# Patient Record
Sex: Male | Born: 1950 | Race: White | Hispanic: No | Marital: Married | Smoking: Current some day smoker
Health system: Southern US, Community
[De-identification: ages and names within clinical notes are randomized; demographics above are authoritative.]

## PROBLEM LIST (undated history)

## (undated) DIAGNOSIS — N200 Calculus of kidney: Secondary | ICD-10-CM

## (undated) DIAGNOSIS — I714 Abdominal aortic aneurysm, without rupture, unspecified: Secondary | ICD-10-CM

## (undated) DIAGNOSIS — N289 Disorder of kidney and ureter, unspecified: Secondary | ICD-10-CM

## (undated) DIAGNOSIS — E785 Hyperlipidemia, unspecified: Secondary | ICD-10-CM

## (undated) HISTORY — PX: HERNIA REPAIR: SHX51

## (undated) HISTORY — PX: OTHER SURGICAL HISTORY: SHX169

## (undated) HISTORY — PX: SHOULDER SURGERY: SHX246

## (undated) HISTORY — DX: Abdominal aortic aneurysm, without rupture, unspecified: I71.40

## (undated) HISTORY — DX: Hyperlipidemia, unspecified: E78.5

## (undated) HISTORY — PX: SPINE SURGERY: SHX786

## (undated) HISTORY — DX: Abdominal aortic aneurysm, without rupture: I71.4

---

## 1998-05-04 ENCOUNTER — Other Ambulatory Visit: Admission: RE | Admit: 1998-05-04 | Discharge: 1998-05-04 | Payer: Self-pay | Admitting: Family Medicine

## 2001-11-18 ENCOUNTER — Encounter: Payer: Self-pay | Admitting: Urology

## 2001-11-18 ENCOUNTER — Encounter: Admission: RE | Admit: 2001-11-18 | Discharge: 2001-11-18 | Payer: Self-pay | Admitting: Urology

## 2007-01-15 ENCOUNTER — Encounter (INDEPENDENT_AMBULATORY_CARE_PROVIDER_SITE_OTHER): Payer: Self-pay | Admitting: Specialist

## 2007-01-15 ENCOUNTER — Ambulatory Visit (HOSPITAL_BASED_OUTPATIENT_CLINIC_OR_DEPARTMENT_OTHER): Admission: RE | Admit: 2007-01-15 | Discharge: 2007-01-15 | Payer: Self-pay | Admitting: Surgery

## 2007-12-05 ENCOUNTER — Ambulatory Visit (HOSPITAL_BASED_OUTPATIENT_CLINIC_OR_DEPARTMENT_OTHER): Admission: RE | Admit: 2007-12-05 | Discharge: 2007-12-05 | Payer: Self-pay | Admitting: Orthopedic Surgery

## 2011-04-13 ENCOUNTER — Encounter: Payer: Self-pay | Admitting: Vascular Surgery

## 2011-04-24 ENCOUNTER — Encounter (INDEPENDENT_AMBULATORY_CARE_PROVIDER_SITE_OTHER): Payer: BC Managed Care – PPO | Admitting: Surgery

## 2011-04-24 ENCOUNTER — Other Ambulatory Visit: Payer: Self-pay | Admitting: Surgery

## 2011-04-24 ENCOUNTER — Encounter (INDEPENDENT_AMBULATORY_CARE_PROVIDER_SITE_OTHER): Payer: BC Managed Care – PPO

## 2011-04-24 DIAGNOSIS — I714 Abdominal aortic aneurysm, without rupture: Secondary | ICD-10-CM

## 2011-04-25 NOTE — Assessment & Plan Note (Signed)
OFFICE VISIT  Ethan [name], Ethan [name] DOB:  May 18, 1951                                       04/24/2011 ZOXWR#:[id number]  REASON FOR VISIT:  Abdominal aortic aneurysm.  HISTORY:  This is a very pleasant 60 year old gentleman I am seeing at the request of Dr. Clarene Duke for evaluation of abdominal aortic aneurysm. The patient has a significant family history including his father and 4- 6 uncles who suffered from abdominal aortic aneurysms.  The patient's aneurysm was detected at a church screening, and he was sent for followup.  He denies having any symptoms of abdominal pain or back pain. He does not take any medications and is healthy.  His only risk factors are his family history and his tobacco abuse.  REVIEW OF SYSTEMS:  All negative as documented in the encounter form.  PAST MEDICAL HISTORY:  Abdominal aortic aneurysm.  PAST SURGICAL HISTORY: 1. Lower back fusion L2-3 and L3-4. 2. Right lower tibia fibula fracture. 3. Left and right hernia repair.  SOCIAL HISTORY:  He is married with 3 children.  Smokes half pack to pack a day.  Does not drink.  FAMILY HISTORY:  Positive for aneurysm in his father and uncle.  MEDICATIONS:  None.  ALLERGIES:  None.  PHYSICAL EXAM:  Vital signs:  Heart rate 76, blood pressure 132/87, O2 sat 99%.  General:  He is well-appearing, in no distress.  HEENT: Within normal limits.  Conjunctivae normal.  Lungs:  Clear bilaterally. No wheezes or rhonchi.  Cardiovascular:  Regular rate and rhythm.  No carotid bruits.  Palpable femoral pulses.  Palpable pedal pulses. Abdomen:  Soft, nontender.  Pulsatile mass is present.  No hepatosplenomegaly.  Musculoskeletal:  Without major deformities. Neurological:  He is intact.  Skin:  Without ulcer.  DIAGNOSTIC STUDIES:  Ultrasound was repeated in our office today that shows a 4.8 x 4.7 abdominal aortic aneurysm.  Reviewing his records from his life screening he has minimal carotid  stenosis bilaterally.  His ankle brachial indices are now 1.0 bilaterally.  He had some irregularities within his EKG that were nonspecific.  ASSESSMENT:  Asymptomatic abdominal aortic aneurysm.  PLAN:  The patient will be scheduled for a CT angiogram of his chest, abdomen and pelvis to fully evaluate the size and location of his aneurysm.  He will have this done within the next 2 weeks and return for further discussions regarding the timing and type of repair that might be required.  He will need to have cardiac clearance.  He did have an EKG abnormality at his screening that will need to be addressed.  His wife sees Dr. Viann Fish and therefore I am going to refer him to Dr. Donnie Aho.  I will plan on seeing him back in 2 weeks.    Jorge Ny, MD Electronically Signed  VWB/MEDQ  D:  04/24/2011  T:  04/25/2011  Job:  [id number]  cc:   [name], M.D. [name], MD

## 2011-05-04 NOTE — Procedures (Unsigned)
DUPLEX ULTRASOUND OF ABDOMINAL AORTA  INDICATION:  Abdominal aortic aneurysm.  HISTORY: Diabetes:  No. Cardiac:  No. Hypertension:  No. Smoking:  Yes. Connective Tissue Disorder: Family History:  Yes. Previous Surgery:  No.  DUPLEX EXAM:         AP (cm)                   TRANSVERSE (cm) Proximal             2.8 cm                    2.7 cm Mid                  4.7 cm                    4.8 cm Distal               3.7 cm                    3.1 cm Right Iliac          1.4 cm                    1.8 cm Left Iliac           1.2 cm                    1.3 cm  PREVIOUS:  Date:  AP:  4.7  TRANSVERSE:  4.7  IMPRESSION:  Stable appearing abdominal aortic aneurysm within the mid distal aorta measuring 4.8 x 4.7 cm with intraluminal thrombus and atherosclerosis visualized.  The right iliac appears ectatic.  Bilateral iliacs have moderate atherosclerosis.  ___________________________________________ V. Charlena Cross, MD  OD/MEDQ  D:  04/24/2011  T:  04/24/2011  Job:  [id number]

## 2011-05-08 ENCOUNTER — Ambulatory Visit (INDEPENDENT_AMBULATORY_CARE_PROVIDER_SITE_OTHER): Payer: BC Managed Care – PPO | Admitting: Surgery

## 2011-05-08 ENCOUNTER — Ambulatory Visit
Admission: RE | Admit: 2011-05-08 | Discharge: 2011-05-08 | Disposition: A | Discharge status: Home / Self Care | Payer: BC Managed Care – PPO | Source: Ambulatory Visit | Attending: Surgery | Admitting: Surgery

## 2011-05-08 DIAGNOSIS — I714 Abdominal aortic aneurysm, without rupture: Secondary | ICD-10-CM

## 2011-05-08 MED ORDER — IOHEXOL 350 MG/ML SOLN
100.0000 mL | Freq: Once | INTRAVENOUS | Status: AC | PRN
  Administered 2011-05-08: 100 mL via INTRAVENOUS

## 2011-05-09 NOTE — Op Note (Signed)
NAME:  Ethan, [name] NO.:  [id number]   MEDICAL RECORD NO.:  [id number]          PATIENT TYPE:  AMB   LOCATION:  DSC                          FACILITY:  [facility]   PHYSICIAN:  Loreta Ave, M.D. DATE OF BIRTH:  Sep 30, 1951   DATE OF PROCEDURE:  12/05/2007  DATE OF DISCHARGE:  10/04/2007                               OPERATIVE REPORT   PREOPERATIVE DIAGNOSIS:  Left shoulder impingement.  Marked degenerative  joint disease, osteolysis acromioclavicular joint.   POSTOPERATIVE DIAGNOSIS:  Left shoulder impingement.  Marked  degenerative joint disease, osteolysis acromioclavicular joint, with  some abrasive change on the top of the cuff.  No full-thickness tears.   PROCEDURE:  Left shoulder exam under anesthesia, arthroscopy with  debridement of cuff, bursectomy, acromioplasty, coracoacromial ligament  release and excision of distal clavicle.   SURGEON:  Loreta Ave, M.D.   ASSISTANT:  Zonia Kief, PA   ANESTHESIA:  General.   BLOOD LOSS:  Minimal.   SPECIMENS:  None.   CULTURES:  None.   COMPLICATIONS:  None.   DRESSING:  Soft compressive with sling.   PROCEDURE:  The patient brought to the operating room, placed on  operating table in supine position.  After adequate anesthesia had been  obtained, his shoulders examined.  Full motion and stability.  Placed in  beach-chair position on the shoulder positioner, prepped and draped in  the usual sterile fashion.  Three portals, anterior posterior and  lateral.  Shoulder entered with blunt obturator, distended.  Arthroscope  introduced the shoulder distended inspected.  Articular cartilage,  biceps tendon, biceps anchor, undersurface cuff, capsule ligamentous  structures all looked good.  Labrum intact.  Cannula redirected  subacromially.  Obvious impingement.  Bursa resected.  Abrasive changes  on the top of the cuff anteriorly debrided.  No full-thickness tears.  Type 2 to 3 acromion.  Acromioplasty  to type 1 acromion with shaver high-  speed bur.  CA ligament release with cautery.  Distal clavicle exposed.  Grade 4 changes, cystic changes and spurring.  Periarticular spurs  lateral centimeter of clavicle resected with a shaver high-speed bur.  Adequacy of decompression, clavicle excision, cuff debridement confirmed  viewing from all portals.  Instruments and fluid removed.  Portals shoulder and bursa injected with  Marcaine.  Portals closed with 4-0 nylon.  Sterile compressive dressing  applied.  Sling applied.  Anesthesia reversed.  Brought to recovery  room.  Tolerated surgery well.  No complications.      Loreta Ave, M.D.  Electronically Signed     DFM/MEDQ  D:  12/05/2007  T:  12/05/2007  Job:  [id number]

## 2011-05-09 NOTE — Assessment & Plan Note (Signed)
OFFICE VISIT  Ethan [name], Ethan [name] DOB:  1951/06/02                                       05/08/2011 ZOXWR#:[id number]  Patient comes back in today to discuss CT scan findings of his aneurysm. I spent over an hour reviewing his records and talking with the patient about the findings.  I think he is a good candidate for endovascular repair.  I think we can do this percutaneously.  I discussed the risks and benefits, including the risk of GI complications, lower extremity complications, paralysis, bleeding.  All of his questions were answered.  We have scheduled this for Thursday, June 21.    Jorge Ny, MD Electronically Signed  VWB/MEDQ  D:  05/08/2011  T:  05/09/2011  Job:  [id number]  cc:   [name], MD

## 2011-05-12 NOTE — Op Note (Signed)
NAME:  THADDAEUS, GRANJA             ACCOUNT NO.:  [id number]   MEDICAL RECORD NO.:  [id number]          PATIENT TYPE:  AMB   LOCATION:  DSC                          FACILITY:  [facility]   PHYSICIAN:  Thornton Park. Daphine Deutscher, MD  DATE OF BIRTH:  [birth year]   DATE OF PROCEDURE:  01/15/2007  DATE OF DISCHARGE:                               OPERATIVE REPORT   CCS NUMBER:  [id number]   PREOPERATIVE DIAGNOSIS:  Right inguinal hernia.   POSTOPERATIVE DIAGNOSIS:  Right indirect inguinal hernia.   PROCEDURES:  Repair with by excision of sac and repair of floor with a  piece of Bard mesh.   SURGEON:  Dr. Luretha Murphy   ANESTHESIA:  General.   SPECIMEN:  Hernia sac.   DESCRIPTION OF PROCEDURE:  Keric Zehren is a 60 year old man who was  taken to room one at [facility].  The right inguinal region was  clipped and prepped with Techni-Care and draped sterilely.  An oblique  incision was made and carried down to the tissue to the external oblique  which was incised along its fibers and mobilized the cord structures.  I  separated the ilioinguinal nerve branch and retracted with the medial  flap of fascia.  The cord was then mobilized and I went proximally and  dissected out a sac which I opened with my finger inside.  I peeled away  from the remaining cord structures and did high ligation with a  pursestring suture of 2-0 silk.  Excess sac was cut off and this was  allowed to retract up into the abdomen.  I put my finger inside and did  not feel any other defects.  I then repaired the floor with a piece of  Bard mesh cut to fit and sutured along the inguinal ligament with a  running 0 Prolene.  It was medially similarly was sewn to the internal  oblique and was tucked around the cord structures and sutured to itself  with a single horizontal suture of 0 Prolene.  This was tucked beneath  the external oblique and the external oblique was closed with running 2-  0 Vicryl.  4-0 Vicryl used the  subcutaneous tissue and the skin was  closed with 4-0 Vicryls as well as 5-0 Vicryls and Dermabond.  The  patient seemed to tolerate the procedure well was taken to recovery room  in satisfactory condition.  He will be given a prescription for Tylox to  take for pain and will be followed up in the office within about 3  weeks.      Thornton Park Daphine Deutscher, MD  Electronically Signed     MBM/MEDQ  D:  01/15/2007  T:  01/15/2007  Job:  [id number]   cc:   [name]

## 2011-05-15 ENCOUNTER — Other Ambulatory Visit: Payer: Self-pay | Admitting: Cardiology

## 2011-05-15 ENCOUNTER — Ambulatory Visit
Admission: RE | Admit: 2011-05-15 | Discharge: 2011-05-15 | Disposition: A | Discharge status: Home / Self Care | Payer: BC Managed Care – PPO | Source: Ambulatory Visit | Attending: Cardiology | Admitting: Cardiology

## 2011-05-15 DIAGNOSIS — I714 Abdominal aortic aneurysm, without rupture: Secondary | ICD-10-CM

## 2011-05-18 ENCOUNTER — Inpatient Hospital Stay (HOSPITAL_BASED_OUTPATIENT_CLINIC_OR_DEPARTMENT_OTHER)
Admission: RE | Admit: 2011-05-18 | Discharge: 2011-05-18 | Disposition: A | Discharge status: Home / Self Care | Payer: BC Managed Care – PPO | Source: Ambulatory Visit | Attending: Cardiology | Admitting: Cardiology

## 2011-05-18 DIAGNOSIS — N186 End stage renal disease: Secondary | ICD-10-CM | POA: Insufficient documentation

## 2011-05-18 DIAGNOSIS — Z5309 Procedure and treatment not carried out because of other contraindication: Secondary | ICD-10-CM | POA: Insufficient documentation

## 2011-05-18 DIAGNOSIS — R791 Abnormal coagulation profile: Secondary | ICD-10-CM | POA: Insufficient documentation

## 2011-06-15 ENCOUNTER — Inpatient Hospital Stay (HOSPITAL_COMMUNITY): Payer: BC Managed Care – PPO

## 2011-06-15 ENCOUNTER — Inpatient Hospital Stay (HOSPITAL_COMMUNITY)
Admission: RE | Admit: 2011-06-15 | Discharge: 2011-06-16 | DRG: 111 | Disposition: A | Discharge status: Home / Self Care | Payer: BC Managed Care – PPO | Source: Ambulatory Visit | Attending: Surgery | Admitting: Surgery

## 2011-06-15 DIAGNOSIS — I714 Abdominal aortic aneurysm, without rupture, unspecified: Principal | ICD-10-CM | POA: Diagnosis present

## 2011-06-15 DIAGNOSIS — Z8249 Family history of ischemic heart disease and other diseases of the circulatory system: Secondary | ICD-10-CM

## 2011-06-15 DIAGNOSIS — Z981 Arthrodesis status: Secondary | ICD-10-CM

## 2011-06-15 DIAGNOSIS — F17200 Nicotine dependence, unspecified, uncomplicated: Secondary | ICD-10-CM | POA: Diagnosis present

## 2011-06-15 HISTORY — PX: ABDOMINAL AORTIC ANEURYSM REPAIR: SUR1152

## 2011-06-15 LAB — COMPREHENSIVE METABOLIC PANEL
AST: 19 U/L (ref 0–37)
Albumin: 3.7 g/dL (ref 3.5–5.2)
BUN: 15 mg/dL (ref 6–23)
Calcium: 9.1 mg/dL (ref 8.4–10.5)
Chloride: 103 mEq/L (ref 96–112)
Creatinine, Ser: 1.08 mg/dL (ref 0.50–1.35)
Total Bilirubin: 0.3 mg/dL (ref 0.3–1.2)

## 2011-06-15 LAB — CBC
HCT: 46.8 % (ref 39.0–52.0)
MCH: 33.3 pg (ref 26.0–34.0)
MCH: 32.6 pg (ref 26.0–34.0)
MCV: 92.7 fL (ref 78.0–100.0)
MCV: 93 fL (ref 78.0–100.0)
Platelets: 225 10*3/uL (ref 150–400)
Platelets: 155 10*3/uL (ref 150–400)
RBC: 5.05 MIL/uL (ref 4.22–5.81)
RDW: 12.3 % (ref 11.5–15.5)
WBC: 8.8 10*3/uL (ref 4.0–10.5)
WBC: 7.4 10*3/uL (ref 4.0–10.5)

## 2011-06-15 LAB — PROTIME-INR
INR: 1.01 (ref 0.00–1.49)
Prothrombin Time: 13.5 seconds (ref 11.6–15.2)
Prothrombin Time: 16 seconds — ABNORMAL HIGH (ref 11.6–15.2)

## 2011-06-15 LAB — URINALYSIS, ROUTINE W REFLEX MICROSCOPIC
Bilirubin Urine: NEGATIVE
Hgb urine dipstick: NEGATIVE
Nitrite: NEGATIVE
Protein, ur: NEGATIVE mg/dL
Urobilinogen, UA: 0.2 mg/dL (ref 0.0–1.0)

## 2011-06-15 LAB — BLOOD GAS, ARTERIAL
Drawn by: 181601
FIO2: 0.21 %
O2 Saturation: 96.8 %
Patient temperature: 98.6
pH, Arterial: 7.45 (ref 7.350–7.450)

## 2011-06-15 LAB — TYPE AND SCREEN

## 2011-06-15 LAB — SURGICAL PCR SCREEN
MRSA, PCR: NEGATIVE
Staphylococcus aureus: NEGATIVE

## 2011-06-15 LAB — BASIC METABOLIC PANEL
CO2: 25 mEq/L (ref 19–32)
Calcium: 8.3 mg/dL — ABNORMAL LOW (ref 8.4–10.5)
Creatinine, Ser: 0.96 mg/dL (ref 0.50–1.35)
Glucose, Bld: 85 mg/dL (ref 70–99)

## 2011-06-15 LAB — ABO/RH: ABO/RH(D): A POS

## 2011-06-15 LAB — APTT: aPTT: 35 seconds (ref 24–37)

## 2011-06-16 LAB — CBC
HCT: 39.9 % (ref 39.0–52.0)
MCH: 32.4 pg (ref 26.0–34.0)
MCHC: 35.1 g/dL (ref 30.0–36.0)
MCV: 92.4 fL (ref 78.0–100.0)
RDW: 12.2 % (ref 11.5–15.5)

## 2011-06-16 LAB — BASIC METABOLIC PANEL
BUN: 13 mg/dL (ref 6–23)
Calcium: 8.1 mg/dL — ABNORMAL LOW (ref 8.4–10.5)
Creatinine, Ser: 0.97 mg/dL (ref 0.50–1.35)
GFR calc Af Amer: >60 mL/min (ref >60)
GFR calc non Af Amer: >60 mL/min (ref >60)

## 2011-06-20 ENCOUNTER — Other Ambulatory Visit: Payer: Self-pay | Admitting: Surgery

## 2011-06-20 DIAGNOSIS — I714 Abdominal aortic aneurysm, without rupture: Secondary | ICD-10-CM

## 2011-06-20 NOTE — Discharge Summary (Addendum)
  NAMEMORY, HERRMAN             ACCOUNT NO.:  [id number]  MEDICAL RECORD NO.:  [id number]  LOCATION:  3312                         FACILITY:  [facility]  PHYSICIAN:  Juleen China IV, MDDATE OF BIRTH:  Jul 20, 1951  DATE OF ADMISSION:  06/15/2011 DATE OF DISCHARGE:  06/16/2011                              DISCHARGE SUMMARY   CHIEF COMPLAINT:  Abdominal aortic aneurysm.  HISTORY OF PRESENT ILLNESS:  Mr. Rindfleisch is a pleasant 60 year old gentleman who was referred to [place] by Dr. Clarene Duke for evaluation of abdominal aortic aneurysm.  The patient had a significant family history including his father and 4 uncles who suffered from abdominal aortic aneurysms.  The patient's aneurysm was detected at a church screening and he was sent for a followup.  He denied any symptoms of abdominal pain or back pain.  He is not on any medications except pravastatin.  He is otherwise healthy.  His only risk factors are family history and tobacco abuse.  PAST MEDICAL HISTORY: 1. Abdominal aortic aneurysm. 2. Lower back fusion, L2-L3 and L3-L4. 3. Right lower tib-fib fracture. 4. Left and right hernia repair.  The patient was evaluated for an endovascular repair for his 4.8 x 4.7 abdominal aneurysm and it was felt that he was an endovascular candidate.  He is also cleared by Dr. Donnie Aho from a cardiac standpoint.  HOSPITAL COURSE:  The patient was taken to the operating room on June 15, 2011 for an endovascular repair of abdominal aortic aneurysm using a percutaneous closure device.  The main body of the Gore Excluder graft was 28 x 14 x 12.  He had a 14 x 14 extension on one side and 16 x 10 extension on the other and he had Perclose x4.  Postoperatively, the patient did well.  He was ambulating, voiding, and taking p.o.  His groin wounds were soft and without hematoma.  He had palpable DP and PT pulses and he was discharged to home.  FINAL DIAGNOSIS:  Abdominal aortic aneurysm status post  endovascular repair.  DISPOSITION:  The patient was discharged home.  He will followup in 4 weeks with Dr. Myra Gianotti and have a CTA of the abdomen and pelvis at that time.  DISCHARGE MEDICATIONS: 1. Aspirin 81 mg daily. 2. Percocet 1-2 tablets by every 4 hours as needed for pain. 3. Pravastatin 20 mg daily.     Della Goo, PA-C   ______________________________ Seth Bake. Charlena Cross, MD    RR/MEDQ  D:  06/16/2011  T:  06/16/2011  Job:  [id number]  Electronically Signed by Della Goo PA on 06/20/2011 02:27:52 PM Electronically Signed by Arelia Longest IV MD on 07/09/2011 10:58:30 PM

## 2011-06-27 NOTE — Cardiovascular Report (Signed)
  NAME:  Ethan [name], Ethan [name] NO.:  [id number]  MEDICAL RECORD NO.:  [id number]            PATIENT TYPE:  LOCATION:                                 FACILITY:  PHYSICIAN:  [name], M.D.     DATE OF BIRTH:  DATE OF PROCEDURE:  05/18/2011                            CARDIAC CATHETERIZATION   HISTORY:  A 60 year old male who has a known abdominal aneurysm, mild hyperlipidemia and smoking.  He has no cardiac symptoms.  He underwent preoperative adenosine stress testing to determine suitability for aneurysm repair and was found to have an area of apical ischemia and is brought in for cardiac catheterization.  PROCEDURE:  The patient was brought to the outpatient diagnostic center and prepped and draped in the usual manner.  After Xylocaine anesthesia, a 4-French sheath was placed in the right femoral artery percutaneously with a single anterior needle wall stick.  Angiograms were made using 4- French catheters and a 30 mL ventriculogram was performed.  The sheath was removed in the holding area.  He tolerated the procedure well without complications.  HEMODYNAMIC DATA:  Aorta postcontrast 116/75, LV postcontrast 116/6-13.  ANGIOGRAPHIC DATA:  Left ventriculogram:  Performed in the 30-degree RAO projection.  The aortic valve is normal.  The mitral valve is normal. The left ventricle is somewhat small chambered and is hyperdynamic. Estimated ejection fraction is greater than 70%.  Coronary arteries arise and distribute normally.  No significant coronary calcification noted.  Left main coronary artery is normal.  Left anterior descending extends to the apex.  The vessel is smooth down to the apex but is small and diminutive at the apex and does not quite extend around the apex. Circumflex coronary artery contains marginal branch and contains no significant stenosis.  Right coronary artery has an anterior takeoff. The posterior descending and posterolateral branches  are small caliber but have no significant disease.  IMPRESSION: 1. Small vessel coronary artery disease with diminutive vessel at     apex, small distal right coronary artery but no severe focal     obstructive stenoses noted. 2. Normal left ventricular function.  RECOMMENDATIONS:  May proceed with aneurysm repair.  Needs to stop smoking.  Risk factor modification with lipid lowering therapy.     [name], M.D.     WST/MEDQ  D:  05/18/2011  T:  05/18/2011  Job:  [id number]  cc:   Jorge Ny, MD [name], MD  Electronically Signed by Lacretia Nicks. Donnie Aho M.D. on 06/27/2011 05:00:41 PM

## 2011-07-09 NOTE — Op Note (Signed)
[name], MORE             ACCOUNT NO.:  [id number]  MEDICAL RECORD NO.:  [id number]  LOCATION:  3312                         FACILITY:  [facility]  PHYSICIAN:  Juleen China IV, MDDATE OF BIRTH:  02/13/1951  DATE OF PROCEDURE:  06/15/2011 DATE OF DISCHARGE:  06/16/2011                              OPERATIVE REPORT   PREOPERATIVE DIAGNOSIS:  Abdominal aortic aneurysm.  POSTOPERATIVE DIAGNOSIS:  Abdominal aortic aneurysm.  PROCEDURES PERFORMED: 1. Endovascular repair of abdominal aortic aneurysm. 2  Distal extension x1. 1. Bilateral ultrasound-guided access. 2. Catheter in aorta x2. 3. Abdominal aortogram.  SURGEON: 1. Charlena Cross, MD  ASSISTANT:  Della Goo, PA-C and Evelene Croon, MD  ANESTHESIA:  General.  BLOOD LOSS:  Minimal.  COMPLICATIONS:  None.  FINDINGS:  Complete exclusion.  DEVICES USES:  Primary right Gore Excluder 28 x 14 x 12.  Distal right extension is Gore excluder 16 x 9.5, contralateral leg is left Gore excluder 14 x 14.  INDICATIONS:  Ethan [name] is a 60 year old gentleman who I saw at the request of Ethan [name] for evaluation of abdominal aortic aneurysm.  The patient has a very strong family history including his father and 4-6 uncles that suffers from abdominal aortic aneurysm.  The aneurysm was detected at a church screening.  This was further evaluated with CT angiogram which revealed an infrarenal abdominal aortic aneurysm. Diameter was 4.8 cm.  It looks like there is a strong family history including rupture.  We have elected to proceed with repair at the size.  PROCEDURE:  The patient was identified in the holding area, taken to room 9, placed supine on the table.  General endotracheal anesthesia was administered.  The patient was prepped and draped in usual fashion.  A time-out was called.  Antibiotics were given.  Ultrasound was used to identify both common femoral arteries bilaterally.  A digital ultrasound image was  obtained showing minimal calcification within the common femoral arteries.  A #11 blade was used to make a skin nick on each side.  Both common femoral arteries were then accessed under ultrasound guidance with an #18 gauge needle.  The tract was dilated with an 8- [place] dilator and then 2 Pro-Glides were placed on either side for preclosure at the 11 o'clock and 1 o'clock position.  Next, 8-French sheaths were placed bilaterally.  A wire exchange was performed on the right side using a Kumpe catheter and a Amplatz superstiff wire was placed.  The catheter was then advanced up the left side over a Bentson wire placed at the level of L1 on the right.  At this point, the patient was given systemic heparinization.  On the right side, the sheath was upsized to a 18-French sheath.  The main body was prepared on the back table.  This was a Biomedical scientist 28 x 14 x 12 device.  This was then advanced to the 18-French sheath at the gate on the ipsilateral side.  A contrast injection was then performed through the Omni flush catheter delineating location of the renal arteries bilaterally.  The graft was then deployed down to the contralateral gate.  Additional arteriogram was performed which showed that the right side  of the graft was slightly above the ostium of the right renal artery.  Therefore, the tough end of the graft was constrained and then brought down and redeployed landing at the level of the right renal artery which was confirmed by angiogram. Next the Omni flush catheter was withdrawn behind the graft.  The contralateral gate was cannulated using the Omniflush catheter and Bentson wire.  Omni flush catheter was then advanced within the main body of the graft and then freely rotated confirming successful cannulation.  At this point, I elected to balloon, mold the proximal portion of the graft and perform an additional angiogram to make sure that the renal arteries remained patent and  the graft was not covering the ostium.  Once this was confirmed, an Amplatz superstiff wire was placed through the Omniflush catheter.  The sheath was then brought down into the pelvis.  Image intensifier was rotated to a right anterior oblique position and a retrograde angiogram was performed locating the left hypogastric artery.  The patient's catheter was removed and a 12- French sheath was advanced over the Amplatz wire and to the contralateral gate.  The contralateral limb was then prepared on the back table and then advanced through the sheath which was then withdrawn.  Contralateral limb was then deployed.  This was a Biomedical scientist 14 x 14 device.  Next, the remaining portion of ipsilateral graft was deployed and the deployment system was removed.  Next the dilator for the 18-French sheath was replaced.  I then prepared the distal extension on the back table.  This was a Biomedical scientist 16 x 9.5 that was then advanced to sheath and then withdrawn.  Image intensifier was rotated to left anterior oblique position and a retrograde angiogram was performed showing the origin of the right hypogastric artery.  The distal extension on the right was then deployed landing at the level of the right hypogastric artery.  I then molded the graft in the proximal end as well as the distal end including the overlap of the devices.  The completion arteriogram was then performed which showed complete exclusion of the aneurysm with preservation and patency of both renal arteries as well as both hypogastric arteries.  There was no endoleak. Bentson wires were then placed bilaterally and then first the right 18- French sheath was removed and the Pro-Glide devices were cinched down. The artery was hemostatic.  Therefore, the Bentson wire was removed and then the Pro-Glide cinched down again.  Similarly, on the left side the sheath was removed and the Pro-Glides were cinched down.  First I left the wire in  place and then the wire was removed.  Both groins were hemostatic.  The patient was given 50 mg of protamine.  After protamine had circulated, I inspected both groins.  They were hemostatic.  Sutures were then trimmed.  Skin was closed with 4-0 Vicryl and Dermabond.  The patient had palpable dorsalis pedis pulses after the case.  He was then successfully extubated and taken to recovery room in stable condition.     Ethan Ny, MD     VWB/MEDQ  D:  06/15/2011  T:  06/16/2011  Job:  [id number]  Electronically Signed by Arelia Longest IV MD on 07/09/2011 10:58:32 PM

## 2011-07-17 ENCOUNTER — Ambulatory Visit: Payer: BC Managed Care – PPO | Admitting: Surgery

## 2011-07-17 ENCOUNTER — Ambulatory Visit
Admission: RE | Admit: 2011-07-17 | Discharge: 2011-07-17 | Disposition: A | Discharge status: Home / Self Care | Payer: BC Managed Care – PPO | Source: Ambulatory Visit | Attending: Surgery | Admitting: Surgery

## 2011-07-17 ENCOUNTER — Ambulatory Visit (INDEPENDENT_AMBULATORY_CARE_PROVIDER_SITE_OTHER): Payer: BC Managed Care – PPO | Admitting: Surgery

## 2011-07-17 DIAGNOSIS — I714 Abdominal aortic aneurysm, without rupture, unspecified: Secondary | ICD-10-CM

## 2011-07-17 MED ORDER — IOHEXOL 350 MG/ML SOLN
100.0000 mL | Freq: Once | INTRAVENOUS | Status: AC | PRN
  Administered 2011-07-17: 100 mL via INTRAVENOUS

## 2011-07-18 NOTE — Assessment & Plan Note (Signed)
OFFICE VISIT  Ethan [name], Ethan [name] DOB:  06/06/1951                                       07/17/2011 YNWGN#:[id number]  The patient is a 60 year old gentleman who is status post percutaneous endovascular repair of abdominal aortic aneurysm on 06/15/2011.  This is his first postoperative visit.  He has essentially no complaints.  Today he had a CT scan which shows the aneurysm to be successfully excluded.  There is no evidence of endoleak.  There are no fluid collections in the groin.  Overall he is doing very well.  I did remove a piece of retained Vicryl suture in the right and left groin.  His skin incisions are healing nicely.  I will plan on seeing him back in 6 months with an ultrasound with most likely getting a CT angiogram in 1 year.  He did have a carotid duplex as a screening at a church screening which was normal.    V. Charlena Cross, MD Electronically Signed  VWB/MEDQ  D:  07/17/2011  T:  07/18/2011  Job:  [id number]  cc:   [name], M.D. [name], MD

## 2012-01-10 ENCOUNTER — Encounter: Payer: Self-pay | Admitting: Surgery

## 2012-01-12 ENCOUNTER — Encounter: Payer: Self-pay | Admitting: Surgery

## 2012-01-15 ENCOUNTER — Encounter: Payer: Self-pay | Admitting: Surgery

## 2012-01-15 ENCOUNTER — Encounter (INDEPENDENT_AMBULATORY_CARE_PROVIDER_SITE_OTHER): Payer: BC Managed Care – PPO | Admitting: *Deleted

## 2012-01-15 ENCOUNTER — Ambulatory Visit (INDEPENDENT_AMBULATORY_CARE_PROVIDER_SITE_OTHER): Payer: BC Managed Care – PPO | Admitting: Surgery

## 2012-01-15 VITALS — BP 132/87 | HR 71 | Resp 16 | Ht 74.0 in | Wt 205.0 lb

## 2012-01-15 DIAGNOSIS — Z48812 Encounter for surgical aftercare following surgery on the circulatory system: Secondary | ICD-10-CM

## 2012-01-15 DIAGNOSIS — I714 Abdominal aortic aneurysm, without rupture: Secondary | ICD-10-CM

## 2012-01-15 NOTE — Progress Notes (Signed)
[facility] [place]   Patient name: Ethan [name] MRN: [id number] DOB: [birth year] Sex: male     Chief Complaint  Patient presents with  . AAA    follow up on AAA endograft with vascular scan    HISTORY OF PRESENT ILLNESS: Patient comes in today for followup of his abdominal aortic aneurysm. He is status post percutaneous endovascular repair using a Gore Excluder device on 06/15/2011. His initial CT scan showed no evidence of endoleak. He denies complaints of abdominal pain at this time.  Past Medical History  Diagnosis Date  . AAA (abdominal aortic aneurysm)   . Hyperlipidemia     Past Surgical History  Procedure Date  . Abdominal aortic aneurysm repair   . Right lower tib-fem fracture   . Hernia repair   . Lower back fusion, l2-l3,and l3-l4     History   Social History  . Marital Status: Married    Spouse Name: N/A    Number of Children: N/A  . Years of Education: N/A   Occupational History  . Not on file.   Social History Main Topics  . Smoking status: Current Everyday Smoker -- 0.5 packs/day  . Smokeless tobacco: Not on file  . Alcohol Use: No  . Drug Use: No  . Sexually Active:    Other Topics Concern  . Not on file   Social History Narrative  . No narrative on file    Family History  Problem Relation Age of Onset  . Aneurysm Father   . Stroke Mother   . Hypertension Sister   . Alcohol abuse Brother   . Cancer Brother     mouth cancer    Allergies as of 01/15/2012  . (No Known Allergies)    Current Outpatient Prescriptions on File Prior to Visit  Medication Sig Dispense Refill  . Aspirin-Acetaminophen-Caffeine (GOODY HEADACHE PO) Take by mouth.         REVIEW OF SYSTEMS: No changes  PHYSICAL EXAMINATION:   Vital signs are BP 132/87  Pulse 71  Resp 16  Ht 6\' 2"  (1.88 m)  Wt 205 lb (92.987 kg)  BMI 26.32 kg/m2  SpO2 98% General: The patient appears their stated age. HEENT:  No gross  abnormalities Pulmonary:  Non labored breathing Abdomen: Soft and non-tender Musculoskeletal: There are no major deformities. Neurologic: No focal weakness or paresthesias are detected, Skin: There are no ulcer or rashes noted. Psychiatric: The patient has normal affect. Cardiovascular: There is a regular rate and rhythm without significant murmur appreciated. Palpable pedal pulses   Diagnostic Studies Ultrasound was ordered and reviewed today this shows a decrease in the size of his aneurysm sac previously it was 4.7 today it measures 4.0 x 4.2. There was no evidence of endoleak  Assessment: Status post endovascular aneurysm repair Plan: Overall the patient is doing very well this time. His aneurysm continues to decrease in size nicely. I am scheduling him come back in 6 months for a CT angiogram of the abdomen and pelvis. His most recent carotid ultrasound showed no significant disease. This was an outside study  V. Charlena Cross, M.D. [facility] [place] Office: [phone] Pager:  [phone]

## 2012-01-29 NOTE — Procedures (Unsigned)
VASCULAR LAB EXAM  INDICATION:  Followup AAA endograft placed 06/15/2011  HISTORY: Diabetes:  No Cardiac:  No Hypertension:  No  EXAM:  AAA sac size : 4.01 cm  AP, 4.2 cm transverse.  Previous sac size 06/15/2011 during surgery: 4.7 cm AP.  IMPRESSION: 1. The aorta and endograft appear patent. 2. No significant change in size of the aneurysmal sac surrounding the     endograft. 3. No evidence of endoleak was detected.  ___________________________________________ V. Charlena Cross, MD  LT/MEDQ  D:  01/15/2012  T:  01/15/2012  Job:  [id number]

## 2012-07-05 ENCOUNTER — Encounter: Payer: Self-pay | Admitting: Surgery

## 2012-07-08 ENCOUNTER — Ambulatory Visit
Admission: RE | Admit: 2012-07-08 | Discharge: 2012-07-08 | Disposition: A | Discharge status: Home / Self Care | Payer: BC Managed Care – PPO | Source: Ambulatory Visit | Attending: Surgery | Admitting: Surgery

## 2012-07-08 ENCOUNTER — Ambulatory Visit (INDEPENDENT_AMBULATORY_CARE_PROVIDER_SITE_OTHER): Payer: BC Managed Care – PPO | Admitting: Surgery

## 2012-07-08 ENCOUNTER — Encounter: Payer: Self-pay | Admitting: Surgery

## 2012-07-08 VITALS — BP 127/85 | HR 77 | Resp 16 | Ht 75.0 in | Wt 208.1 lb

## 2012-07-08 DIAGNOSIS — I714 Abdominal aortic aneurysm, without rupture, unspecified: Secondary | ICD-10-CM | POA: Insufficient documentation

## 2012-07-08 DIAGNOSIS — Z48812 Encounter for surgical aftercare following surgery on the circulatory system: Secondary | ICD-10-CM

## 2012-07-08 MED ORDER — IOHEXOL 350 MG/ML SOLN
100.0000 mL | Freq: Once | INTRAVENOUS | Status: AC | PRN
  Administered 2012-07-08: 100 mL via INTRAVENOUS

## 2012-07-08 NOTE — Progress Notes (Signed)
[facility] of [place]   Patient name: Ethan [name] MRN: [id number] DOB: May 04, 1951 Sex: male     Chief Complaint  Patient presents with  . AAA    6 month f/up with CTA prior at [facility]. Img. today    HISTORY OF PRESENT ILLNESS: The patient is here today for followup. He is status post endovascular aneurysm repair on 06/15/2011. His postoperative course has been uncomplicated. He reports no issues since I last saw. He denies abdominal pain or back pain. He denies claudication symptoms.  Past Medical History  Diagnosis Date  . AAA (abdominal aortic aneurysm)   . Hyperlipidemia     Past Surgical History  Procedure Date  . Abdominal aortic aneurysm repair   . Right lower tib-fem fracture   . Hernia repair   . Lower back fusion, l2-l3,and l3-l4     History   Social History  . Marital Status: Married    Spouse Name: N/A    Number of Children: N/A  . Years of Education: N/A   Occupational History  . Not on file.   Social History Main Topics  . Smoking status: Current Everyday Smoker -- 0.5 packs/day    Types: Cigarettes  . Smokeless tobacco: Not on file  . Alcohol Use: No  . Drug Use: No  . Sexually Active:    Other Topics Concern  . Not on file   Social History Narrative  . No narrative on file    Family History  Problem Relation Age of Onset  . Aneurysm Father   . Stroke Mother   . Hypertension Sister   . Alcohol abuse Brother   . Cancer Brother     mouth cancer    Allergies as of 07/08/2012  . (No Known Allergies)    Current Outpatient Prescriptions on File Prior to Visit  Medication Sig Dispense Refill  . Aspirin-Acetaminophen-Caffeine (GOODY HEADACHE PO) Take by mouth.       No current facility-administered medications on file prior to visit.     REVIEW OF SYSTEMS: No change since prior visit  PHYSICAL EXAMINATION:   Vital signs are BP 127/85  Pulse 77  Resp 16  Ht 6\' 3"  (1.905 m)  Wt 208 lb 1.6 oz (94.394 kg)   BMI 26.01 kg/m2  SpO2 97% General: The patient appears their stated age. HEENT:  No gross abnormalities Pulmonary:  Non labored breathing Abdomen: Soft and non-tender Musculoskeletal: There are no major deformities. Neurologic: No focal weakness or paresthesias are detected, Skin: There are no ulcer or rashes noted. Psychiatric: The patient has normal affect. Cardiovascular: Palpable pedal pulses   Diagnostic Studies I have reviewed his CT angiogram which shows that his aneurysm has decreased from 4.8-3.8 cm. The stent graft is in good position without evidence of endoleak.  Assessment: Status post endovascular aneurysm repair Plan: The patient is doing very well at this time. He will come back in 6 months for ultrasound and see our nurse practitioner. I was seen and in one year with an abdominal ultrasound  V. Charlena Cross, M.D. [facility] [place] Office: [phone] Pager:  [phone]

## 2012-07-08 NOTE — Addendum Note (Signed)
Addended by: Sharee Pimple on: 07/08/2012 12:18 PM   Modules accepted: Orders

## 2013-01-10 ENCOUNTER — Encounter: Payer: Self-pay | Admitting: Neurosurgery

## 2013-01-13 ENCOUNTER — Other Ambulatory Visit (INDEPENDENT_AMBULATORY_CARE_PROVIDER_SITE_OTHER): Payer: BC Managed Care – PPO | Admitting: *Deleted

## 2013-01-13 ENCOUNTER — Ambulatory Visit (INDEPENDENT_AMBULATORY_CARE_PROVIDER_SITE_OTHER): Payer: BC Managed Care – PPO | Admitting: Neurosurgery

## 2013-01-13 ENCOUNTER — Encounter: Payer: Self-pay | Admitting: Neurosurgery

## 2013-01-13 VITALS — BP 123/92 | HR 66 | Resp 16 | Ht 74.5 in | Wt 207.0 lb

## 2013-01-13 DIAGNOSIS — I714 Abdominal aortic aneurysm, without rupture, unspecified: Secondary | ICD-10-CM

## 2013-01-13 DIAGNOSIS — Z48812 Encounter for surgical aftercare following surgery on the circulatory system: Secondary | ICD-10-CM

## 2013-01-13 NOTE — Addendum Note (Signed)
Addended by: Lorin Mercy K on: 01/13/2013 03:00 PM   Modules accepted: Orders

## 2013-01-13 NOTE — Progress Notes (Signed)
[facility] [place] AAA/Carotid Office Note  CC: AAA endovascular repair surveillance Referring Physician: Brabham  History of Present Illness: 62 year old male patient of Dr. Myra Gianotti followed for endovascular repair of a AAA in June of 2012. The patient denies any unusual abdominal or back pain, the patient also denies any new medical diagnoses or recent surgery.  Past Medical History  Diagnosis Date  . AAA (abdominal aortic aneurysm)   . Hyperlipidemia     ROS: [x]  Positive   [ ]  Denies    General: [ ]  Weight loss, [ ]  Fever, [ ]  chills Neurologic: [ ]  Dizziness, [ ]  Blackouts, [ ]  Seizure [ ]  Stroke, [ ]  "Mini stroke", [ ]  Slurred speech, [ ]  Temporary blindness; [ ]  weakness in arms or legs, [ ]  Hoarseness Cardiac: [ ]  Chest pain/pressure, [ ]  Shortness of breath at rest [ ]  Shortness of breath with exertion, [ ]  Atrial fibrillation or irregular heartbeat Vascular: [ ]  Pain in legs with walking, [ ]  Pain in legs at rest, [ ]  Pain in legs at night,  [ ]  Non-healing ulcer, [ ]  Blood clot in vein/DVT,   Pulmonary: [ ]  Home oxygen, [ ]  Productive cough, [ ]  Coughing up blood, [ ]  Asthma,  [ ]  Wheezing Musculoskeletal:  [ ]  Arthritis, [ ]  Low back pain, [ ]  Joint pain Hematologic: [ ]  Easy Bruising, [ ]  Anemia; [ ]  Hepatitis Gastrointestinal: [ ]  Blood in stool, [ ]  Gastroesophageal Reflux/heartburn, [ ]  Trouble swallowing Urinary: [ ]  chronic Kidney disease, [ ]  on HD - [ ]  MWF or [ ]  TTHS, [ ]  Burning with urination, [ ]  Difficulty urinating Skin: [ ]  Rashes, [ ]  Wounds Psychological: [ ]  Anxiety, [ ]  Depression   Social History History  Substance Use Topics  . Smoking status: Current Every Day Smoker -- 0.5 packs/day    Types: Cigarettes  . Smokeless tobacco: Never Used  . Alcohol Use: No    Family History Family History  Problem Relation Age of Onset  . Aneurysm Father   . Stroke Mother   . Heart disease Mother     Aneurysm  . Hypertension  Sister   . Alcohol abuse Brother   . Cancer Brother     mouth cancer    No Known Allergies  Current Outpatient Prescriptions  Medication Sig Dispense Refill  . Aspirin-Acetaminophen-Caffeine (GOODY HEADACHE PO) Take by mouth.        Physical Examination  Filed Vitals:   01/13/13 0956  BP: 123/92  Pulse: 66  Resp: 16    Body mass index is 26.22 kg/(m^2).  General:  WDWN in NAD Gait: Normal HEENT: WNL Eyes: Pupils equal Pulmonary: normal non-labored breathing , without Rales, rhonchi,  wheezing Cardiac: RRR, without  Murmurs, rubs or gallops; Abdomen: soft, NT, no masses Skin: no rashes, ulcers noted  Vascular Exam Pulses: Palpable femoral pulses bilaterally, there is no abdominal mass palpated Carotid bruits: Carotid pulses to auscultation Extremities without ischemic changes, no Gangrene , no cellulitis; no open wounds;  Musculoskeletal: no muscle wasting or atrophy   Neurologic: A&O X 3; Appropriate Affect ; SENSATION: normal; MOTOR FUNCTION:  moving all extremities equally. Speech is fluent/normal  Non-Invasive Vascular Imaging AAA duplex today shows a maximum diameter 3.9 AP by 4.0 transverse which is a slight decrease from previous exam January 2013  ASSESSMENT/PLAN: Asymptomatic AAA repair that will followup in one year with repeat duplex. The patient's questions were encouraged and answered, he is in  agreement with this plan.  Lauree Chandler ANP   Clinic MD: Myra Gianotti

## 2013-07-14 ENCOUNTER — Ambulatory Visit: Payer: BC Managed Care – PPO | Admitting: Neurosurgery

## 2014-01-09 ENCOUNTER — Encounter: Payer: Self-pay | Admitting: Family

## 2014-01-12 ENCOUNTER — Encounter: Payer: Self-pay | Admitting: Family

## 2014-01-12 ENCOUNTER — Ambulatory Visit (INDEPENDENT_AMBULATORY_CARE_PROVIDER_SITE_OTHER): Payer: BC Managed Care – PPO | Admitting: Family

## 2014-01-12 ENCOUNTER — Ambulatory Visit (HOSPITAL_COMMUNITY)
Admission: RE | Admit: 2014-01-12 | Discharge: 2014-01-12 | Disposition: A | Discharge status: Home / Self Care | Payer: BC Managed Care – PPO | Source: Ambulatory Visit | Attending: Family | Admitting: Family

## 2014-01-12 VITALS — BP 127/91 | HR 75 | Resp 16 | Ht 75.0 in | Wt 204.0 lb

## 2014-01-12 DIAGNOSIS — I714 Abdominal aortic aneurysm, without rupture, unspecified: Secondary | ICD-10-CM

## 2014-01-12 DIAGNOSIS — Z48812 Encounter for surgical aftercare following surgery on the circulatory system: Secondary | ICD-10-CM

## 2014-01-12 DIAGNOSIS — Z09 Encounter for follow-up examination after completed treatment for conditions other than malignant neoplasm: Secondary | ICD-10-CM | POA: Insufficient documentation

## 2014-01-12 DIAGNOSIS — N281 Cyst of kidney, acquired: Secondary | ICD-10-CM | POA: Insufficient documentation

## 2014-01-12 NOTE — Patient Instructions (Addendum)
Abdominal Aortic Aneurysm An aneurysm is a weakened or damaged part of an artery wall that bulges from the normal force of blood pumping through the body. An abdominal aortic aneurysm is an aneurysm that occurs in the lower part of the aorta, the main artery of the body.  The major concern with an abdominal aortic aneurysm is that it can enlarge and burst (rupture) or blood can flow between the layers of the wall of the aorta through a tear (aorticdissection). Both of these conditions can cause bleeding inside the body and can be life threatening unless diagnosed and treated promptly. CAUSES  The exact cause of an abdominal aortic aneurysm is unknown. Some contributing factors are:   A hardening of the arteries caused by the buildup of fat and other substances in the lining of a blood vessel (arteriosclerosis).  Inflammation of the walls of an artery (arteritis).   Connective tissue diseases, such as Marfan syndrome.   Abdominal trauma.   An infection, such as syphilis or staphylococcus, in the wall of the aorta (infectious aortitis) caused by bacteria. RISK FACTORS  Risk factors that contribute to an abdominal aortic aneurysm may include:  Age older than 60 years.   High blood pressure (hypertension).  Male gender.  Ethnicity (white race).  Obesity.  Family history of aneurysm (first degree relatives only).  Tobacco use. PREVENTION  The following healthy lifestyle habits may help decrease your risk of abdominal aortic aneurysm:  Quitting smoking. Smoking can raise your blood pressure and cause arteriosclerosis.  Limiting or avoiding alcohol.  Keeping your blood pressure, blood sugar level, and cholesterol levels within normal limits.  Decreasing your salt intake. In somepeople, too much salt can raise blood pressure and increase your risk of abdominal aortic aneurysm.  Eating a diet low in saturated fats and cholesterol.  Increasing your fiber intake by including  whole grains, vegetables, and fruits in your diet. Eating these foods may help lower blood pressure.  Maintaining a healthy weight.  Staying physically active and exercising regularly. SYMPTOMS  The symptoms of abdominal aortic aneurysm may vary depending on the size and rate of growth of the aneurysm.Most grow slowly and do not have any symptoms. When symptoms do occur, they may include:  Pain (abdomen, side, lower back, or groin). The pain may vary in intensity. A sudden onset of severe pain may indicate that the aneurysm has ruptured.  Feeling full after eating only small amounts of food.  Nausea or vomiting or both.  Feeling a pulsating lump in the abdomen.  Feeling faint or passing out. DIAGNOSIS  Since most unruptured abdominal aortic aneurysms have no symptoms, they are often discovered during diagnostic exams for other conditions. An aneurysm may be found during the following procedures:  Ultrasonography (A one-time screening for abdominal aortic aneurysm by ultrasonography is also recommended for all men aged 65-75 years who have ever smoked).  X-ray exams.  A computed tomography (CT).  Magnetic resonance imaging (MRI).  Angiography or arteriography. TREATMENT  Treatment of an abdominal aortic aneurysm depends on the size of your aneurysm, your age, and risk factors for rupture. Medication to control blood pressure and pain may be used to manage aneurysms smaller than 6 cm. Regular monitoring for enlargement may be recommended by your caregiver if:  The aneurysm is 3 4 cm in size (an annual ultrasonography may be recommended).  The aneurysm is 4 4.5 cm in size (an ultrasonography every 6 months may be recommended).  The aneurysm is larger than 4.5   cm in size (your caregiver may ask that you be examined by a vascular surgeon). If your aneurysm is larger than 6 cm, surgical repair may be recommended. There are two main methods for repair of an aneurysm:   Endovascular  repair (a minimally invasive surgery). This is done most often.  Open repair. This method is used if an endovascular repair is not possible. Document Released: 09/20/2005 Document Revised: 04/07/2013 Document Reviewed: 01/10/2013 ExitCare Patient Information 2014 [employer].   Smoking Cessation Quitting smoking is important to your health and has many advantages. However, it is not always easy to quit since nicotine is a very addictive drug. Often times, people try 3 times or more before being able to quit. This document explains the best ways for you to prepare to quit smoking. Quitting takes hard work and a lot of effort, but you can do it. ADVANTAGES OF QUITTING SMOKING  You will live longer, feel better, and live better.  Your body will feel the impact of quitting smoking almost immediately.  Within 20 minutes, blood pressure decreases. Your pulse returns to its normal level.  After 8 hours, carbon monoxide levels in the blood return to normal. Your oxygen level increases.  After 24 hours, the chance of having a heart attack starts to decrease. Your breath, hair, and body stop smelling like smoke.  After 48 hours, damaged nerve endings begin to recover. Your sense of taste and smell improve.  After 72 hours, the body is virtually free of nicotine. Your bronchial tubes relax and breathing becomes easier.  After 2 to 12 weeks, lungs can hold more air. Exercise becomes easier and circulation improves.  The risk of having a heart attack, stroke, cancer, or lung disease is greatly reduced.  After 1 year, the risk of coronary heart disease is cut in half.  After 5 years, the risk of stroke falls to the same as a nonsmoker.  After 10 years, the risk of lung cancer is cut in half and the risk of other cancers decreases significantly.  After 15 years, the risk of coronary heart disease drops, usually to the level of a nonsmoker.  If you are pregnant, quitting smoking will improve  your chances of having a healthy baby.  The people you live with, especially any children, will be healthier.  You will have extra money to spend on things other than cigarettes. QUESTIONS TO THINK ABOUT BEFORE ATTEMPTING TO QUIT You may want to talk about your answers with your caregiver.  Why do you want to quit?  If you tried to quit in the past, what helped and what did not?  What will be the most difficult situations for you after you quit? How will you plan to handle them?  Who can help you through the tough times? Your family? Friends? A caregiver?  What pleasures do you get from smoking? What ways can you still get pleasure if you quit? Here are some questions to ask your caregiver:  How can you help me to be successful at quitting?  What medicine do you think would be best for me and how should I take it?  What should I do if I need more help?  What is smoking withdrawal like? How can I get information on withdrawal? GET READY  Set a quit date.  Change your environment by getting rid of all cigarettes, ashtrays, matches, and lighters in your home, car, or work. Do not let people smoke in your home.  Review your   past attempts to quit. Think about what worked and what did not. GET SUPPORT AND ENCOURAGEMENT You have a better chance of being successful if you have help. You can get support in many ways.  Tell your family, friends, and co-workers that you are going to quit and need their support. Ask them not to smoke around you.  Get individual, group, or telephone counseling and support. Programs are available at local hospitals and health centers. Call your local health department for information about programs in your area.  Spiritual beliefs and practices may help some smokers quit.  Download a "quit meter" on your computer to keep track of quit statistics, such as how long you have gone without smoking, cigarettes not smoked, and money saved.  Get a self-help  book about quitting smoking and staying off of tobacco. LEARN NEW SKILLS AND BEHAVIORS  Distract yourself from urges to smoke. Talk to someone, go for a walk, or occupy your time with a task.  Change your normal routine. Take a different route to work. Drink tea instead of coffee. Eat breakfast in a different place.  Reduce your stress. Take a hot bath, exercise, or read a book.  Plan something enjoyable to do every day. Reward yourself for not smoking.  Explore interactive web-based programs that specialize in helping you quit. GET MEDICINE AND USE IT CORRECTLY Medicines can help you stop smoking and decrease the urge to smoke. Combining medicine with the above behavioral methods and support can greatly increase your chances of successfully quitting smoking.  Nicotine replacement therapy helps deliver nicotine to your body without the negative effects and risks of smoking. Nicotine replacement therapy includes nicotine gum, lozenges, inhalers, nasal sprays, and skin patches. Some may be available over-the-counter and others require a prescription.  Antidepressant medicine helps people abstain from smoking, but how this works is unknown. This medicine is available by prescription.  Nicotinic receptor partial agonist medicine simulates the effect of nicotine in your brain. This medicine is available by prescription. Ask your caregiver for advice about which medicines to use and how to use them based on your health history. Your caregiver will tell you what side effects to look out for if you choose to be on a medicine or therapy. Carefully read the information on the package. Do not use any other product containing nicotine while using a nicotine replacement product.  RELAPSE OR DIFFICULT SITUATIONS Most relapses occur within the first 3 months after quitting. Do not be discouraged if you start smoking again. Remember, most people try several times before finally quitting. You may have symptoms  of withdrawal because your body is used to nicotine. You may crave cigarettes, be irritable, feel very hungry, cough often, get headaches, or have difficulty concentrating. The withdrawal symptoms are only temporary. They are strongest when you first quit, but they will go away within 10 14 days. To reduce the chances of relapse, try to:  Avoid drinking alcohol. Drinking lowers your chances of successfully quitting.  Reduce the amount of caffeine you consume. Once you quit smoking, the amount of caffeine in your body increases and can give you symptoms, such as a rapid heartbeat, sweating, and anxiety.  Avoid smokers because they can make you want to smoke.  Do not let weight gain distract you. Many smokers will gain weight when they quit, usually less than 10 pounds. Eat a healthy diet and stay active. You can always lose the weight gained after you quit.  Find ways to improve your   mood other than smoking. FOR MORE INFORMATION  www.smokefree.gov  Document Released: 12/05/2001 Document Revised: 06/11/2012 Document Reviewed: 03/21/2012 ExitCare Patient Information 2014 [employer].  

## 2014-01-12 NOTE — Progress Notes (Addendum)
[facility] [place]  Established EVAR  History of Present Illness  Ethan [name] is a 63 y.o. (05/14/1951) male patient of Dr. Trula Slade followed for endovascular repair of a AAA in June of 2012.  who presents for routine follow up.   Most recent EVAR duplex (Date: 01/13/2013) demonstrates: no endoleak and 4.0 x 3.9 cm sac size. The patient has not had back or abdominal pain that is referable to AAA,  but does have burning and numbness in right thigh at times that he states is due to L-spine issues. Had 6 uncles die of ruptured AAA. He states he does have a PCP but states he has not seen his PCP in several years, therefore his cholesterol is not being checked. He states rare ETOH consumption. He denies ever having a stroke or TIA symptoms, denies claudication symptoms.  Pt Diabetic: No Pt smoker: smoker  (1-2 ppd x 30+ yrs)   Past Medical History  Diagnosis Date  . AAA (abdominal aortic aneurysm)   . Hyperlipidemia    Past Surgical History  Procedure Laterality Date  . Abdominal aortic aneurysm repair    . Right lower tib-fem fracture    . Hernia repair    . Lower back fusion, l2-l3,and l3-l4     Social History History  Substance Use Topics  . Smoking status: Current Every Day Smoker -- 0.50 packs/day    Types: Cigarettes  . Smokeless tobacco: Never Used  . Alcohol Use: No   Family History Family History  Problem Relation Age of Onset  . Aneurysm Father   . Stroke Mother   . Heart disease Mother     Aneurysm  . Hypertension Sister   . Alcohol abuse Brother   . Cancer Brother     mouth cancer   Current Outpatient Prescriptions on File Prior to Visit  Medication Sig Dispense Refill  . Aspirin-Acetaminophen-Caffeine (GOODY HEADACHE PO) Take by mouth.       No current facility-administered medications on file prior to visit.   No Known Allergies   ROS: See HPI for pertinent positives and negatives.  Physical Examination  Filed  Vitals:   01/12/14 0929  BP: 127/91  Pulse: 75  Resp: 16   Filed Weights   01/12/14 0929  Weight: 204 lb (92.534 kg)   Body mass index is 25.5 kg/(m^2).  General: A&O x 3, WD male.  Pulmonary: Sym exp, fair air movt, CTAB, no rales, rhonchi, or wheezing.  Cardiac: RRR, Nl S1, S2, no Murmurs detected.  Vascular: Vessel Right Left  Radial 3+Palpable 3+Palpable  Carotid Palpable, without bruit Palpable, without bruit  Aorta Not palpable N/A  Femoral Palpable Palpable  Popliteal Not palpable Not palpable  PT Palpable Palpable  DP Palpable Palpable   Gastrointestinal: soft, NTND, -G/R, - HSM, - masses, - CVAT B.  Musculoskeletal: M/S 5/5 throughout, Extremities without ischemic changes.  Neurologic: Pain and light touch intact in extremities, Motor exam as listed above  Non-Invasive Vascular Imaging  EVAR Duplex (Date: 01/12/2014)  AAA sac size: 3.8 cm   Technically difficult exam due to extreme bowel gas  Medical Decision Making  JARID SASSO is a 63 y.o. male who presents s/p EVAR.  Pt is asymptomatic with no change in sac size.  I discussed with the patient the importance of surveillance of the endograft.  I encouraged him to make an appointment with his PCP to get his cholesterol checked and preventive care.   The patient was counseled re  smoking cessation.  The next endograft duplex will be scheduled for 12 months.  The patient will follow up with [place] in 12 months with these studies.  I emphasized the importance of maximal medical management including strict control of blood pressure, blood glucose, and lipid levels, antiplatelet agents, obtaining regular exercise, and cessation of smoking.   The patient was given information about AAA including signs, symptoms, treatment, and how to minimize the risk of enlargement and rupture of aneurysms.    Thank you for allowing [place] to participate in this patient's care.  Clemon Chambers, RN, MSN, FNP-C [facility] [place] Office: [facility] Physician: Trula Slade  01/12/2014, 9:14 AM

## 2014-01-13 ENCOUNTER — Other Ambulatory Visit: Payer: Self-pay | Admitting: *Deleted

## 2014-01-13 DIAGNOSIS — Z48812 Encounter for surgical aftercare following surgery on the circulatory system: Secondary | ICD-10-CM

## 2014-01-13 DIAGNOSIS — I714 Abdominal aortic aneurysm, without rupture, unspecified: Secondary | ICD-10-CM

## 2015-01-08 ENCOUNTER — Encounter: Payer: Self-pay | Admitting: Family

## 2015-01-11 ENCOUNTER — Ambulatory Visit (INDEPENDENT_AMBULATORY_CARE_PROVIDER_SITE_OTHER): Payer: BLUE CROSS/BLUE SHIELD | Admitting: Family

## 2015-01-11 ENCOUNTER — Ambulatory Visit (HOSPITAL_COMMUNITY)
Admission: RE | Admit: 2015-01-11 | Discharge: 2015-01-11 | Disposition: A | Discharge status: Home / Self Care | Payer: BLUE CROSS/BLUE SHIELD | Source: Ambulatory Visit | Attending: Family | Admitting: Family

## 2015-01-11 ENCOUNTER — Encounter: Payer: Self-pay | Admitting: Family

## 2015-01-11 VITALS — BP 119/85 | HR 76 | Resp 16 | Ht 75.0 in | Wt 197.0 lb

## 2015-01-11 DIAGNOSIS — Z48812 Encounter for surgical aftercare following surgery on the circulatory system: Secondary | ICD-10-CM

## 2015-01-11 DIAGNOSIS — Z95828 Presence of other vascular implants and grafts: Secondary | ICD-10-CM

## 2015-01-11 DIAGNOSIS — Z720 Tobacco use: Secondary | ICD-10-CM

## 2015-01-11 DIAGNOSIS — I714 Abdominal aortic aneurysm, without rupture, unspecified: Secondary | ICD-10-CM

## 2015-01-11 DIAGNOSIS — F17200 Nicotine dependence, unspecified, uncomplicated: Secondary | ICD-10-CM

## 2015-01-11 NOTE — Addendum Note (Signed)
Addended by: Mena Goes on: 01/11/2015 11:40 AM   Modules accepted: Orders

## 2015-01-11 NOTE — Progress Notes (Signed)
[facility] [place]  Established EVAR  History of Present Illness  Ethan [name] is a 64 y.o. (22-Aug-1951) male patient of Dr. Trula Slade followed for endovascular repair of a AAA in June of 2012. who presents for routine follow up.  Most recent EVAR duplex (Date: 01/13/2013) demonstrates: no endoleak and 4.0 x 3.9 cm sac size. Most recent CTA of EVAR (07/08/12) reveals his aneurysm had decreased from 4.8-3.8 cm. The stent graft was in good position without evidence of endoleak.  The patient has not had back or abdominal pain that is referable to AAA, but does have burning and numbness in right thigh at times that he states is due to L-spine issues. Had 6 uncles die of ruptured AAA. He states he does have a PCP but states he has not seen his PCP in several years, therefore his cholesterol is not being checked and has yet not been seen by his PCP. He was treated for pneumonia a year ago. He states rare ETOH consumption. He denies ever having a stroke or TIA symptoms, denies claudication symptoms.  Pt Diabetic: No Pt smoker: smoker (from 1-2 ppd to 8-10 cigarettes/day x 30+ yrs)  Past Medical History  Diagnosis Date  . AAA (abdominal aortic aneurysm)   . Hyperlipidemia    Past Surgical History  Procedure Laterality Date  . Abdominal aortic aneurysm repair    . Right lower tib-fem fracture    . Hernia repair    . Lower back fusion, l2-l3,and l3-l4     Social History History  Substance Use Topics  . Smoking status: Current Every Day Smoker -- 0.50 packs/day    Types: Cigarettes  . Smokeless tobacco: Never Used  . Alcohol Use: No   Family History Family History  Problem Relation Age of Onset  . Aneurysm Father   . Stroke Mother   . Heart disease Mother     Aneurysm  . Hypertension Sister   . Alcohol abuse Brother   . Cancer Brother     mouth cancer  . Aneurysm Paternal Uncle    Current Outpatient Prescriptions on File Prior to Visit   Medication Sig Dispense Refill  . Aspirin-Acetaminophen-Caffeine (GOODY HEADACHE PO) Take by mouth.     No current facility-administered medications on file prior to visit.   No Known Allergies   ROS: See HPI for pertinent positives and negatives.  Physical Examination  Filed Vitals:   01/11/15 0913  BP: 119/85  Pulse: 76  Resp: 16  Height: 6\' 3"  (1.905 m)  Weight: 197 lb (89.359 kg)  SpO2: 96%   Body mass index is 24.62 kg/(m^2).  General: A&O x 3, WD male.  Pulmonary: Sym exp, fair air movt, CTAB, no rales, rhonchi, or wheezing.  Cardiac: RRR, Nl S1, S2, no Murmurs detected.  Vascular: Vessel Right Left  Radial 2+Palpable 2+Palpable  Carotid Palpable, without bruit Palpable, without bruit  Aorta Not palpable N/A  Femoral Palpable Palpable  Popliteal Not palpable  palpable  PT Palpable Palpable  DP Palpable Palpable   Gastrointestinal: soft, NTND, -G/R, - HSM, - palpable masses, - CVAT B.  Musculoskeletal: M/S 5/5 throughout, Extremities without ischemic changes.  Neurologic: Pain and light touch intact in extremities, Motor exam as listed above  Non-Invasive Vascular Imaging  EVAR Duplex (Date: 01/11/2015)  ABDOMINAL AORTA DUPLEX EVALUATION - POST ENDOVASCULAR REPAIR    INDICATION: Follow up aneurysm repair    PREVIOUS INTERVENTION(S): Endovascular repair of the abdominal aortic aneurysm 06/15/2011    DUPLEX  EXAM:      DIAMETER AP (cm) DIAMETER TRANSVERSE (cm) VELOCITIES (cm/sec)  Aorta 3.9 - 54  Right Common Iliac 1.4 - 46  Left Common Iliac 1.0 - 38    Comparison Study       Date DIAMETER AP (cm) DIAMETER TRANSVERSE (cm)  01/12/2014 3.8 -     ADDITIONAL FINDINGS:     IMPRESSION: 1. Technically difficult exam due to extreme bowel gas 2. 3.9 cm AP sac size    Compared to the previous exam:  No change since prior exam     Medical Decision Making  [name] is a 64 y.o. male who presents s/p EVAR (Date:  June/2012).  Pt is asymptomatic with no increase in sac size. Unfortunately he continues to smoke, but fortunately has decreased use. The patient was counseled re smoking cessation and given several free resources re smoking cessation.   I discussed with the patient the importance of surveillance of the endograft.  The next endograft duplex will be scheduled for 12 months.  The patient will follow up with [place] in 12 months with these studies.  I emphasized the importance of maximal medical management including strict control of blood pressure, blood glucose, and lipid levels, antiplatelet agents, obtaining regular exercise, and cessation of smoking.   The patient was given information about AAA including signs, symptoms, treatment, and how to minimize the risk of enlargement and rupture of aneurysms.    Thank you for allowing [place] to participate in this patient's care.  Clemon Chambers, RN, MSN, FNP-C [facility] [place] Office: [facility] Physician: Trula Slade  01/11/2015, 9:06 AM

## 2015-01-11 NOTE — Patient Instructions (Signed)
Abdominal Aortic Aneurysm An aneurysm is a weakened or damaged part of an artery wall that bulges from the normal force of blood pumping through the body. An abdominal aortic aneurysm is an aneurysm that occurs in the lower part of the aorta, the main artery of the body.  The major concern with an abdominal aortic aneurysm is that it can enlarge and burst (rupture) or blood can flow between the layers of the wall of the aorta through a tear (aorticdissection). Both of these conditions can cause bleeding inside the body and can be life threatening unless diagnosed and treated promptly. CAUSES  The exact cause of an abdominal aortic aneurysm is unknown. Some contributing factors are:   A hardening of the arteries caused by the buildup of fat and other substances in the lining of a blood vessel (arteriosclerosis).  Inflammation of the walls of an artery (arteritis).   Connective tissue diseases, such as Marfan syndrome.   Abdominal trauma.   An infection, such as syphilis or staphylococcus, in the wall of the aorta (infectious aortitis) caused by bacteria. RISK FACTORS  Risk factors that contribute to an abdominal aortic aneurysm may include:  Age older than 60 years.   High blood pressure (hypertension).  Male gender.  Ethnicity (white race).  Obesity.  Family history of aneurysm (first degree relatives only).  Tobacco use. PREVENTION  The following healthy lifestyle habits may help decrease your risk of abdominal aortic aneurysm:  Quitting smoking. Smoking can raise your blood pressure and cause arteriosclerosis.  Limiting or avoiding alcohol.  Keeping your blood pressure, blood sugar level, and cholesterol levels within normal limits.  Decreasing your salt intake. In somepeople, too much salt can raise blood pressure and increase your risk of abdominal aortic aneurysm.  Eating a diet low in saturated fats and cholesterol.  Increasing your fiber intake by including  whole grains, vegetables, and fruits in your diet. Eating these foods may help lower blood pressure.  Maintaining a healthy weight.  Staying physically active and exercising regularly. SYMPTOMS  The symptoms of abdominal aortic aneurysm may vary depending on the size and rate of growth of the aneurysm.Most grow slowly and do not have any symptoms. When symptoms do occur, they may include:  Pain (abdomen, side, lower back, or groin). The pain may vary in intensity. A sudden onset of severe pain may indicate that the aneurysm has ruptured.  Feeling full after eating only small amounts of food.  Nausea or vomiting or both.  Feeling a pulsating lump in the abdomen.  Feeling faint or passing out. DIAGNOSIS  Since most unruptured abdominal aortic aneurysms have no symptoms, they are often discovered during diagnostic exams for other conditions. An aneurysm may be found during the following procedures:  Ultrasonography (A one-time screening for abdominal aortic aneurysm by ultrasonography is also recommended for all men aged 65-75 years who have ever smoked).  X-ray exams.  A computed tomography (CT).  Magnetic resonance imaging (MRI).  Angiography or arteriography. TREATMENT  Treatment of an abdominal aortic aneurysm depends on the size of your aneurysm, your age, and risk factors for rupture. Medication to control blood pressure and pain may be used to manage aneurysms smaller than 6 cm. Regular monitoring for enlargement may be recommended by your caregiver if:  The aneurysm is 3-4 cm in size (an annual ultrasonography may be recommended).  The aneurysm is 4-4.5 cm in size (an ultrasonography every 6 months may be recommended).  The aneurysm is larger than 4.5 cm in   size (your caregiver may ask that you be examined by a vascular surgeon). If your aneurysm is larger than 6 cm, surgical repair may be recommended. There are two main methods for repair of an aneurysm:   Endovascular  repair (a minimally invasive surgery). This is done most often.  Open repair. This method is used if an endovascular repair is not possible. Document Released: 09/20/2005 Document Revised: 04/07/2013 Document Reviewed: 01/10/2013 Exit[employer] Patient Information 2015 [employer]. This information is not intended to replace advice given to you by your health care provider. Make sure you discuss any questions you have with your health care provider.   Smoking Cessation Quitting smoking is important to your health and has many advantages. However, it is not always easy to quit since nicotine is a very addictive drug. Oftentimes, people try 3 times or more before being able to quit. This document explains the best ways for you to prepare to quit smoking. Quitting takes hard work and a lot of effort, but you can do it. ADVANTAGES OF QUITTING SMOKING  You will live longer, feel better, and live better.  Your body will feel the impact of quitting smoking almost immediately.  Within 20 minutes, blood pressure decreases. Your pulse returns to its normal level.  After 8 hours, carbon monoxide levels in the blood return to normal. Your oxygen level increases.  After 24 hours, the chance of having a heart attack starts to decrease. Your breath, hair, and body stop smelling like smoke.  After 48 hours, damaged nerve endings begin to recover. Your sense of taste and smell improve.  After 72 hours, the body is virtually free of nicotine. Your bronchial tubes relax and breathing becomes easier.  After 2 to 12 weeks, lungs can hold more air. Exercise becomes easier and circulation improves.  The risk of having a heart attack, stroke, cancer, or lung disease is greatly reduced.  After 1 year, the risk of coronary heart disease is cut in half.  After 5 years, the risk of stroke falls to the same as a nonsmoker.  After 10 years, the risk of lung cancer is cut in half and the risk of other cancers  decreases significantly.  After 15 years, the risk of coronary heart disease drops, usually to the level of a nonsmoker.  If you are pregnant, quitting smoking will improve your chances of having a healthy baby.  The people you live with, especially any children, will be healthier.  You will have extra money to spend on things other than cigarettes. QUESTIONS TO THINK ABOUT BEFORE ATTEMPTING TO QUIT You may want to talk about your answers with your health care provider.  Why do you want to quit?  If you tried to quit in the past, what helped and what did not?  What will be the most difficult situations for you after you quit? How will you plan to handle them?  Who can help you through the tough times? Your family? Friends? A health care provider?  What pleasures do you get from smoking? What ways can you still get pleasure if you quit? Here are some questions to ask your health care provider:  How can you help me to be successful at quitting?  What medicine do you think would be best for me and how should I take it?  What should I do if I need more help?  What is smoking withdrawal like? How can I get information on withdrawal? GET READY  Set a quit   date.  Change your environment by getting rid of all cigarettes, ashtrays, matches, and lighters in your home, car, or work. Do not let people smoke in your home.  Review your past attempts to quit. Think about what worked and what did not. GET SUPPORT AND ENCOURAGEMENT You have a better chance of being successful if you have help. You can get support in many ways.  Tell your family, friends, and coworkers that you are going to quit and need their support. Ask them not to smoke around you.  Get individual, group, or telephone counseling and support. Programs are available at local hospitals and health centers. Call your local health department for information about programs in your area.  Spiritual beliefs and practices may  help some smokers quit.  Download a "quit meter" on your computer to keep track of quit statistics, such as how long you have gone without smoking, cigarettes not smoked, and money saved.  Get a self-help book about quitting smoking and staying off tobacco. LEARN NEW SKILLS AND BEHAVIORS  Distract yourself from urges to smoke. Talk to someone, go for a walk, or occupy your time with a task.  Change your normal routine. Take a different route to work. Drink tea instead of coffee. Eat breakfast in a different place.  Reduce your stress. Take a hot bath, exercise, or read a book.  Plan something enjoyable to do every day. Reward yourself for not smoking.  Explore interactive web-based programs that specialize in helping you quit. GET MEDICINE AND USE IT CORRECTLY Medicines can help you stop smoking and decrease the urge to smoke. Combining medicine with the above behavioral methods and support can greatly increase your chances of successfully quitting smoking.  Nicotine replacement therapy helps deliver nicotine to your body without the negative effects and risks of smoking. Nicotine replacement therapy includes nicotine gum, lozenges, inhalers, nasal sprays, and skin patches. Some may be available over-the-counter and others require a prescription.  Antidepressant medicine helps people abstain from smoking, but how this works is unknown. This medicine is available by prescription.  Nicotinic receptor partial agonist medicine simulates the effect of nicotine in your brain. This medicine is available by prescription. Ask your health care provider for advice about which medicines to use and how to use them based on your health history. Your health care provider will tell you what side effects to look out for if you choose to be on a medicine or therapy. Carefully read the information on the package. Do not use any other product containing nicotine while using a nicotine replacement product.    RELAPSE OR DIFFICULT SITUATIONS Most relapses occur within the first 3 months after quitting. Do not be discouraged if you start smoking again. Remember, most people try several times before finally quitting. You may have symptoms of withdrawal because your body is used to nicotine. You may crave cigarettes, be irritable, feel very hungry, cough often, get headaches, or have difficulty concentrating. The withdrawal symptoms are only temporary. They are strongest when you first quit, but they will go away within 10-14 days. To reduce the chances of relapse, try to:  Avoid drinking alcohol. Drinking lowers your chances of successfully quitting.  Reduce the amount of caffeine you consume. Once you quit smoking, the amount of caffeine in your body increases and can give you symptoms, such as a rapid heartbeat, sweating, and anxiety.  Avoid smokers because they can make you want to smoke.  Do not let weight gain distract you. Many   smokers will gain weight when they quit, usually less than 10 pounds. Eat a healthy diet and stay active. You can always lose the weight gained after you quit.  Find ways to improve your mood other than smoking. FOR MORE INFORMATION  www.smokefree.gov  Document Released: 12/05/2001 Document Revised: 04/27/2014 Document Reviewed: 03/21/2012 [employer] Patient Information 2015 [employer]. This information is not intended to replace advice given to you by your health care provider. Make sure you discuss any questions you have with your health care provider.   Smoking Cessation, Tips for Success If you are ready to quit smoking, congratulations! You have chosen to help yourself be healthier. Cigarettes bring nicotine, tar, carbon monoxide, and other irritants into your body. Your lungs, heart, and blood vessels will be able to work better without these poisons. There are many different ways to quit smoking. Nicotine gum, nicotine patches, a nicotine inhaler, or nicotine  nasal spray can help with physical craving. Hypnosis, support groups, and medicines help break the habit of smoking. WHAT THINGS CAN I DO TO MAKE QUITTING EASIER?  Here are some tips to help you quit for good:  Pick a date when you will quit smoking completely. Tell all of your friends and family about your plan to quit on that date.  Do not try to slowly cut down on the number of cigarettes you are smoking. Pick a quit date and quit smoking completely starting on that day.  Throw away all cigarettes.   Clean and remove all ashtrays from your home, work, and car.  On a card, write down your reasons for quitting. Carry the card with you and read it when you get the urge to smoke.  Cleanse your body of nicotine. Drink enough water and fluids to keep your urine clear or pale yellow. Do this after quitting to flush the nicotine from your body.  Learn to predict your moods. Do not let a bad situation be your excuse to have a cigarette. Some situations in your life might tempt you into wanting a cigarette.  Never have "just one" cigarette. It leads to wanting another and another. Remind yourself of your decision to quit.  Change habits associated with smoking. If you smoked while driving or when feeling stressed, try other activities to replace smoking. Stand up when drinking your coffee. Brush your teeth after eating. Sit in a different chair when you read the paper. Avoid alcohol while trying to quit, and try to drink fewer caffeinated beverages. Alcohol and caffeine may urge you to smoke.  Avoid foods and drinks that can trigger a desire to smoke, such as sugary or spicy foods and alcohol.  Ask people who smoke not to smoke around you.  Have something planned to do right after eating or having a cup of coffee. For example, plan to take a walk or exercise.  Try a relaxation exercise to calm you down and decrease your stress. Remember, you may be tense and nervous for the first 2 weeks after  you quit, but this will pass.  Find new activities to keep your hands busy. Play with a pen, coin, or rubber band. Doodle or draw things on paper.  Brush your teeth right after eating. This will help cut down on the craving for the taste of tobacco after meals. You can also try mouthwash.   Use oral substitutes in place of cigarettes. Try using lemon drops, carrots, cinnamon sticks, or chewing gum. Keep them handy so they are available when you   have the urge to smoke.  When you have the urge to smoke, try deep breathing.  Designate your home as a nonsmoking area.  If you are a heavy smoker, ask your health care provider about a prescription for nicotine chewing gum. It can ease your withdrawal from nicotine.  Reward yourself. Set aside the cigarette money you save and buy yourself something nice.  Look for support from others. Join a support group or smoking cessation program. Ask someone at home or at work to help you with your plan to quit smoking.  Always ask yourself, "Do I need this cigarette or is this just a reflex?" Tell yourself, "Today, I choose not to smoke," or "I do not want to smoke." You are reminding yourself of your decision to quit.  Do not replace cigarette smoking with electronic cigarettes (commonly called e-cigarettes). The safety of e-cigarettes is unknown, and some may contain harmful chemicals.  If you relapse, do not give up! Plan ahead and think about what you will do the next time you get the urge to smoke. HOW WILL I FEEL WHEN I QUIT SMOKING? You may have symptoms of withdrawal because your body is used to nicotine (the addictive substance in cigarettes). You may crave cigarettes, be irritable, feel very hungry, cough often, get headaches, or have difficulty concentrating. The withdrawal symptoms are only temporary. They are strongest when you first quit but will go away within 10-14 days. When withdrawal symptoms occur, stay in control. Think about your reasons  for quitting. Remind yourself that these are signs that your body is healing and getting used to being without cigarettes. Remember that withdrawal symptoms are easier to treat than the major diseases that smoking can cause.  Even after the withdrawal is over, expect periodic urges to smoke. However, these cravings are generally short lived and will go away whether you smoke or not. Do not smoke! WHAT RESOURCES ARE AVAILABLE TO HELP ME QUIT SMOKING? Your health care provider can direct you to community resources or hospitals for support, which may include:  Group support.  Education.  Hypnosis.  Therapy. Document Released: 09/08/2004 Document Revised: 04/27/2014 Document Reviewed: 05/29/2013 ExitCare Patient Information 2015 [employer]. This information is not intended to replace advice given to you by your health care provider. Make sure you discuss any questions you have with your health care provider.   

## 2015-03-11 ENCOUNTER — Emergency Department (HOSPITAL_COMMUNITY): Payer: BLUE CROSS/BLUE SHIELD | Admitting: Anesthesiology

## 2015-03-11 ENCOUNTER — Encounter (HOSPITAL_COMMUNITY): Payer: Self-pay

## 2015-03-11 ENCOUNTER — Emergency Department (HOSPITAL_COMMUNITY): Payer: BLUE CROSS/BLUE SHIELD

## 2015-03-11 ENCOUNTER — Encounter (HOSPITAL_COMMUNITY)
Admission: EM | Disposition: A | Discharge status: Home / Self Care | Payer: Self-pay | Source: Home / Self Care | Attending: Emergency Medicine

## 2015-03-11 ENCOUNTER — Observation Stay (HOSPITAL_COMMUNITY)
Admission: EM | Admit: 2015-03-11 | Discharge: 2015-03-13 | Disposition: A | Discharge status: Home / Self Care | Payer: BLUE CROSS/BLUE SHIELD | Attending: Urology | Admitting: Urology

## 2015-03-11 DIAGNOSIS — F17210 Nicotine dependence, cigarettes, uncomplicated: Secondary | ICD-10-CM | POA: Insufficient documentation

## 2015-03-11 DIAGNOSIS — N3001 Acute cystitis with hematuria: Secondary | ICD-10-CM

## 2015-03-11 DIAGNOSIS — R05 Cough: Secondary | ICD-10-CM

## 2015-03-11 DIAGNOSIS — R059 Cough, unspecified: Secondary | ICD-10-CM

## 2015-03-11 DIAGNOSIS — N200 Calculus of kidney: Secondary | ICD-10-CM

## 2015-03-11 DIAGNOSIS — N201 Calculus of ureter: Secondary | ICD-10-CM | POA: Diagnosis not present

## 2015-03-11 DIAGNOSIS — N1330 Unspecified hydronephrosis: Secondary | ICD-10-CM | POA: Insufficient documentation

## 2015-03-11 HISTORY — PX: CYSTOSCOPY WITH RETROGRADE PYELOGRAM, URETEROSCOPY AND STENT PLACEMENT: SHX5789

## 2015-03-11 HISTORY — DX: Disorder of kidney and ureter, unspecified: N28.9

## 2015-03-11 LAB — CBC WITH DIFFERENTIAL/PLATELET
Basophils Absolute: 0 10*3/uL (ref 0.0–0.1)
Basophils Relative: 0 % (ref 0–1)
EOS ABS: 0 10*3/uL (ref 0.0–0.7)
Eosinophils Relative: 0 % (ref 0–5)
HCT: 45.7 % (ref 39.0–52.0)
Hemoglobin: 15.8 g/dL (ref 13.0–17.0)
LYMPHS PCT: 10 % — AB (ref 12–46)
Lymphs Abs: 0.8 10*3/uL (ref 0.7–4.0)
MCH: 33 pg (ref 26.0–34.0)
MCHC: 34.6 g/dL (ref 30.0–36.0)
MCV: 95.4 fL (ref 78.0–100.0)
Monocytes Absolute: 0.6 10*3/uL (ref 0.1–1.0)
Monocytes Relative: 7 % (ref 3–12)
NEUTROS ABS: 6.6 10*3/uL (ref 1.7–7.7)
Neutrophils Relative %: 83 % — ABNORMAL HIGH (ref 43–77)
PLATELETS: 152 10*3/uL (ref 150–400)
RBC: 4.79 MIL/uL (ref 4.22–5.81)
RDW: 12.5 % (ref 11.5–15.5)
WBC: 8 10*3/uL (ref 4.0–10.5)

## 2015-03-11 LAB — BASIC METABOLIC PANEL
Anion gap: 8 (ref 5–15)
BUN: 20 mg/dL (ref 6–23)
CHLORIDE: 105 mmol/L (ref 96–112)
CO2: 22 mmol/L (ref 19–32)
CREATININE: 1.96 mg/dL — AB (ref 0.50–1.35)
Calcium: 8.4 mg/dL (ref 8.4–10.5)
GFR calc Af Amer: 40 mL/min — ABNORMAL LOW (ref >90)
GFR, EST NON AFRICAN AMERICAN: 34 mL/min — AB (ref >90)
Glucose, Bld: 107 mg/dL — ABNORMAL HIGH (ref 70–99)
Potassium: 3.8 mmol/L (ref 3.5–5.1)
SODIUM: 135 mmol/L (ref 135–145)

## 2015-03-11 LAB — CBC
HCT: 42.1 % (ref 39.0–52.0)
Hemoglobin: 14.2 g/dL (ref 13.0–17.0)
MCH: 32.4 pg (ref 26.0–34.0)
MCHC: 33.7 g/dL (ref 30.0–36.0)
MCV: 96.1 fL (ref 78.0–100.0)
PLATELETS: 133 10*3/uL — AB (ref 150–400)
RBC: 4.38 MIL/uL (ref 4.22–5.81)
RDW: 12.5 % (ref 11.5–15.5)
WBC: 6.1 10*3/uL (ref 4.0–10.5)

## 2015-03-11 LAB — URINE MICROSCOPIC-ADD ON

## 2015-03-11 LAB — URINALYSIS, ROUTINE W REFLEX MICROSCOPIC
BILIRUBIN URINE: NEGATIVE
GLUCOSE, UA: NEGATIVE mg/dL
KETONES UR: NEGATIVE mg/dL
NITRITE: NEGATIVE
PROTEIN: NEGATIVE mg/dL
SPECIFIC GRAVITY, URINE: 1.019 (ref 1.005–1.030)
Urobilinogen, UA: 0.2 mg/dL (ref 0.0–1.0)
pH: 5 (ref 5.0–8.0)

## 2015-03-11 LAB — I-STAT CG4 LACTIC ACID, ED: Lactic Acid, Venous: 0.85 mmol/L (ref 0.5–2.0)

## 2015-03-11 LAB — CREATININE, SERUM
CREATININE: 1.75 mg/dL — AB (ref 0.50–1.35)
GFR calc non Af Amer: 39 mL/min — ABNORMAL LOW (ref >90)
GFR, EST AFRICAN AMERICAN: 46 mL/min — AB (ref >90)

## 2015-03-11 SURGERY — CYSTOURETEROSCOPY, WITH RETROGRADE PYELOGRAM AND STENT INSERTION
Anesthesia: General | Laterality: Right

## 2015-03-11 MED ORDER — MORPHINE SULFATE 2 MG/ML IJ SOLN: 2.0000 mg | INTRAMUSCULAR | Status: DC | PRN

## 2015-03-11 MED ORDER — LIDOCAINE HCL (CARDIAC) 20 MG/ML IV SOLN
INTRAVENOUS | Status: DC | PRN
  Administered 2015-03-11: 100 mg via INTRAVENOUS

## 2015-03-11 MED ORDER — LACTATED RINGERS IV SOLN
INTRAVENOUS | Status: DC | PRN
  Administered 2015-03-11 (×2): via INTRAVENOUS

## 2015-03-11 MED ORDER — MIDAZOLAM HCL 5 MG/5ML IJ SOLN
INTRAMUSCULAR | Status: DC | PRN
  Administered 2015-03-11: 2 mg via INTRAVENOUS

## 2015-03-11 MED ORDER — LIDOCAINE HCL 2 % EX GEL
CUTANEOUS | Status: AC
  Filled 2015-03-11: qty 10

## 2015-03-11 MED ORDER — DEXTROSE 5 % IV SOLN
2.0000 g | Freq: Once | INTRAVENOUS | Status: AC
  Administered 2015-03-11: 2 g via INTRAVENOUS
  Filled 2015-03-11: qty 2

## 2015-03-11 MED ORDER — ACETAMINOPHEN 500 MG PO TABS
1000.0000 mg | ORAL_TABLET | Freq: Once | ORAL | Status: AC
  Administered 2015-03-11: 1000 mg via ORAL
  Filled 2015-03-11: qty 2

## 2015-03-11 MED ORDER — OXYCODONE HCL 5 MG PO TABS: 5.0000 mg | ORAL_TABLET | Freq: Once | ORAL | Status: DC | PRN

## 2015-03-11 MED ORDER — IOHEXOL 300 MG/ML  SOLN
INTRAMUSCULAR | Status: DC | PRN
Start: 2015-03-11 — End: 2015-03-11
  Administered 2015-03-11: 4 mL via INTRAVENOUS

## 2015-03-11 MED ORDER — PHENYLEPHRINE 40 MCG/ML (10ML) SYRINGE FOR IV PUSH (FOR BLOOD PRESSURE SUPPORT)
PREFILLED_SYRINGE | INTRAVENOUS | Status: AC
  Filled 2015-03-11: qty 10

## 2015-03-11 MED ORDER — DEXAMETHASONE SODIUM PHOSPHATE 10 MG/ML IJ SOLN
INTRAMUSCULAR | Status: DC | PRN
  Administered 2015-03-11: 10 mg via INTRAVENOUS

## 2015-03-11 MED ORDER — CEFTRIAXONE SODIUM IN DEXTROSE 20 MG/ML IV SOLN
1.0000 g | INTRAVENOUS | Status: DC
  Administered 2015-03-12: 1 g via INTRAVENOUS
  Filled 2015-03-11: qty 50

## 2015-03-11 MED ORDER — HYDROMORPHONE HCL 1 MG/ML IJ SOLN: 0.2500 mg | INTRAMUSCULAR | Status: DC | PRN

## 2015-03-11 MED ORDER — TAMSULOSIN HCL 0.4 MG PO CAPS
0.4000 mg | ORAL_CAPSULE | Freq: Every day | ORAL | Status: DC
  Administered 2015-03-11 – 2015-03-13 (×3): 0.4 mg via ORAL
  Filled 2015-03-11 (×3): qty 1

## 2015-03-11 MED ORDER — PROPOFOL 10 MG/ML IV BOLUS
INTRAVENOUS | Status: AC
  Filled 2015-03-11: qty 20

## 2015-03-11 MED ORDER — HYDROCODONE-ACETAMINOPHEN 5-325 MG PO TABS
1.0000 | ORAL_TABLET | Freq: Three times a day (TID) | ORAL | Status: DC | PRN
  Administered 2015-03-11 – 2015-03-12 (×2): 1 via ORAL
  Filled 2015-03-11 (×2): qty 1

## 2015-03-11 MED ORDER — ONDANSETRON HCL 4 MG/2ML IJ SOLN: 4.0000 mg | INTRAMUSCULAR | Status: DC | PRN

## 2015-03-11 MED ORDER — PROMETHAZINE HCL 25 MG PO TABS: 25.0000 mg | ORAL_TABLET | Freq: Four times a day (QID) | ORAL | Status: DC | PRN

## 2015-03-11 MED ORDER — HEPARIN SODIUM (PORCINE) 5000 UNIT/ML IJ SOLN
5000.0000 [IU] | Freq: Three times a day (TID) | INTRAMUSCULAR | Status: DC
  Administered 2015-03-12 – 2015-03-13 (×3): 5000 [IU] via SUBCUTANEOUS
  Filled 2015-03-11 (×6): qty 1

## 2015-03-11 MED ORDER — ACETAMINOPHEN 325 MG PO TABS
650.0000 mg | ORAL_TABLET | ORAL | Status: DC | PRN
  Administered 2015-03-12 (×3): 650 mg via ORAL
  Filled 2015-03-11 (×3): qty 2

## 2015-03-11 MED ORDER — LIDOCAINE HCL 2 % EX GEL
CUTANEOUS | Status: DC | PRN
  Administered 2015-03-11: 1

## 2015-03-11 MED ORDER — OXYCODONE HCL 5 MG/5ML PO SOLN: 5.0000 mg | Freq: Once | ORAL | Status: DC | PRN

## 2015-03-11 MED ORDER — PHENYLEPHRINE HCL 10 MG/ML IJ SOLN
INTRAMUSCULAR | Status: DC | PRN
  Administered 2015-03-11: 120 ug via INTRAVENOUS
  Administered 2015-03-11: 80 ug via INTRAVENOUS
  Administered 2015-03-11: 120 ug via INTRAVENOUS

## 2015-03-11 MED ORDER — LACTATED RINGERS IV SOLN
INTRAVENOUS | Status: DC
  Administered 2015-03-11: 1000 mL via INTRAVENOUS

## 2015-03-11 MED ORDER — MIDAZOLAM HCL 2 MG/2ML IJ SOLN
INTRAMUSCULAR | Status: AC
  Filled 2015-03-11: qty 2

## 2015-03-11 MED ORDER — KCL IN DEXTROSE-NACL 20-5-0.45 MEQ/L-%-% IV SOLN
INTRAVENOUS | Status: DC
  Administered 2015-03-11 – 2015-03-13 (×2): via INTRAVENOUS
  Filled 2015-03-11 (×4): qty 1000

## 2015-03-11 MED ORDER — FENTANYL CITRATE 0.05 MG/ML IJ SOLN
INTRAMUSCULAR | Status: AC
  Filled 2015-03-11: qty 2

## 2015-03-11 MED ORDER — FENTANYL CITRATE 0.05 MG/ML IJ SOLN
INTRAMUSCULAR | Status: DC | PRN
  Administered 2015-03-11: 100 ug via INTRAVENOUS

## 2015-03-11 MED ORDER — ONDANSETRON HCL 4 MG/2ML IJ SOLN
INTRAMUSCULAR | Status: DC | PRN
Start: 2015-03-11 — End: 2015-03-11
  Administered 2015-03-11: 4 mg via INTRAVENOUS

## 2015-03-11 MED ORDER — PROPOFOL 10 MG/ML IV BOLUS
INTRAVENOUS | Status: DC | PRN
  Administered 2015-03-11: 200 mg via INTRAVENOUS

## 2015-03-11 MED ORDER — LIDOCAINE HCL (CARDIAC) 20 MG/ML IV SOLN
INTRAVENOUS | Status: AC
  Filled 2015-03-11: qty 5

## 2015-03-11 MED ORDER — PROMETHAZINE HCL 25 MG/ML IJ SOLN: 6.2500 mg | INTRAMUSCULAR | Status: DC | PRN

## 2015-03-11 MED ORDER — CEFTRIAXONE SODIUM 1 G IJ SOLR: 1.0000 g | Freq: Once | INTRAMUSCULAR | Status: DC

## 2015-03-11 MED ORDER — OXYBUTYNIN CHLORIDE 5 MG PO TABS
5.0000 mg | ORAL_TABLET | Freq: Three times a day (TID) | ORAL | Status: DC | PRN
  Administered 2015-03-11 – 2015-03-12 (×3): 5 mg via ORAL
  Filled 2015-03-11 (×3): qty 1

## 2015-03-11 SURGICAL SUPPLY — 19 items
BAG URINE DRAINAGE (UROLOGICAL SUPPLIES) ×3 IMPLANT
BASKET ZERO TIP NITINOL 2.4FR (BASKET) IMPLANT
BSKT STON RTRVL ZERO TP 2.4FR (BASKET)
CATH INTERMIT  6FR 70CM (CATHETERS) IMPLANT
CLOTH BEACON ORANGE TIMEOUT ST (SAFETY) ×3 IMPLANT
FIBER LASER FLEXIVA 1000 (UROLOGICAL SUPPLIES) IMPLANT
FIBER LASER FLEXIVA 200 (UROLOGICAL SUPPLIES) IMPLANT
FIBER LASER FLEXIVA 365 (UROLOGICAL SUPPLIES) IMPLANT
FIBER LASER FLEXIVA 550 (UROLOGICAL SUPPLIES) IMPLANT
FIBER LASER TRAC TIP (UROLOGICAL SUPPLIES) IMPLANT
GLOVE BIOGEL M STRL SZ7.5 (GLOVE) ×3 IMPLANT
GOWN STRL REUS W/TWL XL LVL3 (GOWN DISPOSABLE) IMPLANT
GUIDEWIRE .038 (WIRE) IMPLANT
GUIDEWIRE ANG ZIPWIRE 038X150 (WIRE) IMPLANT
GUIDEWIRE STR DUAL SENSOR (WIRE) ×3 IMPLANT
IV NS IRRIG 3000ML ARTHROMATIC (IV SOLUTION) ×3 IMPLANT
PACK CYSTO (CUSTOM PROCEDURE TRAY) ×3 IMPLANT
SHIELD EYE BINOCULAR (MISCELLANEOUS) IMPLANT
STENT CONTOUR 7FRX26X.038 (STENTS) ×2 IMPLANT

## 2015-03-11 NOTE — ED Notes (Signed)
Pt c/o dysuria and decreased urinary output x 2 weeks, fever x 3 days, and R flank pain x 2 days.  Denies pain.  Pt was seen by urologist yesterday and had a CT scan, which resulted a kidney stone.  Pt reports he was directed to come into ED, if fever increased.  Pt has not taken anything for fever this morning.

## 2015-03-11 NOTE — Interval H&P Note (Signed)
History and Physical Interval Note:  03/11/2015 3:40 PM  Ethan [name]  has presented today for surgery, with the diagnosis of right ureteral stone with fever  The various methods of treatment have been discussed with the patient and family. After consideration of risks, benefits and other options for treatment, the patient has consented to  Procedure(s): CYSTOSCOPY WITH RETROGRADE PYELOGRAM, AND RIGHT URETERAL STENT PLACEMENT (Right) as a surgical intervention .  The patient's history has been reviewed, patient examined, no change in status, stable for surgery.  I have reviewed the patient's chart and labs.  Questions were answered to the patient's satisfaction.     Genise Strack S

## 2015-03-11 NOTE — Op Note (Signed)
Preoperative diagnosis: Right proximal ureteral stone 9 mm with concurrent febrile UTI Postoperative diagnosis: Same  Procedure: Cystoscopy, right retrograde pyelogram, insertion of right double-J stent 26 cm 7 French   Surgeon: Bernestine Amass M.D.  Anesthesia: Gen.  Indications: Patient presented to our office yesterday and was diagnosed with a right proximal ureteral stone. He re-presented to the emergency room this morning with fever and chills. Given presumptive febrile urinary tract infection with an obstructing stone we felt that urgent/semiurgent decompression was indicated. Patient was informed that no attempts at definitive management of the stone should be undertaken in this type of circumstance. We plan on insertion of double-J stent to decompress the right renal unit with hospital admission and then follow-up for definitive treatment at a later date. Full informed consent has been obtained.     Technique and findings: Patient was brought the operating room we has successful induction of general anesthesia. He was placed in lithotomy position and prepped and draped in usual manner. He received Rocephin in the emergency room. Appropriate surgical timeout was performed. Cystoscopy was performed with an unremarkable anterior and prostatic urethra. 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 The bladder was endoscopically normal other than some mild trabeculation.   Right retrograde pyelogram was done with an open-ended catheter and fluoroscopic interpretation. A 9 mm filling defect was noted in the proximal right ureter with what appeared to be high-grade obstruction.   A guidewire was placed beyond the stone. Over the guidewire we placed a 7 French 26 cm double-J stent. With placement of the stent it did appear that the stone migrated back into the renal pelvis/calyceal system. No obvious complications or problems occurred and the patient was brought to recovery room stable condition.

## 2015-03-11 NOTE — Anesthesia Preprocedure Evaluation (Signed)
Anesthesia Evaluation  Patient identified by MRN, date of birth, ID band Patient awake    Reviewed: Allergy & Precautions, NPO status , Patient's Chart, lab work & pertinent test results  History of Anesthesia Complications Negative for: history of anesthetic complications  Airway Mallampati: I       Dental  (+) Teeth Intact, Dental Advisory Given   Pulmonary Current Smoker,  breath sounds clear to auscultation        Cardiovascular + Peripheral Vascular Disease Rhythm:Regular Rate:Normal     Neuro/Psych    GI/Hepatic negative GI ROS, Neg liver ROS,   Endo/Other  negative endocrine ROS  Renal/GU Renal disease     Musculoskeletal   Abdominal   Peds  Hematology negative hematology ROS (+)   Anesthesia Other Findings   Reproductive/Obstetrics                             Anesthesia Physical Anesthesia Plan  ASA: II  Anesthesia Plan: General   Post-op Pain Management:    Induction: Intravenous  Airway Management Planned: LMA  Additional Equipment:   Intra-op Plan:   Post-operative Plan: Extubation in OR  Informed Consent: I have reviewed the patients History and Physical, chart, labs and discussed the procedure including the risks, benefits and alternatives for the proposed anesthesia with the patient or authorized representative who has indicated his/her understanding and acceptance.   Dental advisory given  Plan Discussed with: CRNA and Surgeon  Anesthesia Plan Comments:         Anesthesia Quick Evaluation

## 2015-03-11 NOTE — Transfer of Care (Signed)
Immediate Anesthesia Transfer of Care Note  Patient: Ethan [name]  Procedure(s) Performed: Procedure(s): CYSTOSCOPY WITH RETROGRADE PYELOGRAM, AND RIGHT URETERAL STENT PLACEMENT (Right)  Patient Location: PACU  Anesthesia Type:General  Level of Consciousness: sedated  Airway & Oxygen Therapy: Patient Spontanous Breathing and Patient connected to face mask oxygen  Post-op Assessment: Report given to RN and Post -op Vital signs reviewed and stable  Post vital signs: Reviewed and stable  Last Vitals:  Filed Vitals:   03/11/15 1033  BP: 134/82  Pulse: 82  Temp: 37.1 C  Resp: 17    Complications: No apparent anesthesia complications

## 2015-03-11 NOTE — ED Provider Notes (Signed)
CSN: [id number]     Arrival date & time 03/11/15  0702 History   First MD Initiated Contact with Patient 03/11/15 [phone]     Chief Complaint  Patient presents with  . Flank Pain  . Dysuria     (Consider location/radiation/quality/duration/timing/severity/associated sxs/prior Treatment) HPI   This is a 64 year old male who presents the emergency department with fever after a diagnosis of right-sided kidney stone yesterday. The patient states that on Sunday he began with symptoms of upper respiratory infection. He has had low-grade elevated temperatures in the 99's over the week ago with associated myalgias, fatigue, malaise. He denies abdominal pain, nausea, vomiting, urinary symptoms. Yesterday he developed right flank pain and went to see his urologist, who diagnosed him with a kidney stone which is very proximal to the kidney. He was warned that if he developed a fever above 100. He should present to the emergency department. Patient states around 4 AM he had shaking chills and rigors. He took a temperature at that time and had a fever of 102.72F. Patient came in for further evaluation. He is a chronic daily smoker. `  Past Medical History  Diagnosis Date  . AAA (abdominal aortic aneurysm)   . Hyperlipidemia   . Renal disorder    Past Surgical History  Procedure Laterality Date  . Right lower tib-fem fracture    . Hernia repair    . Lower back fusion, l2-l3,and l3-l4    . Spine surgery    . Abdominal aortic aneurysm repair  06-15-11    EVAR  . Shoulder surgery Bilateral    Family History  Problem Relation Age of Onset  . Aneurysm Father   . Stroke Mother   . Heart disease Mother     Aneurysm  . Hypertension Sister   . Alcohol abuse Brother   . Cancer Brother     mouth cancer  . Aneurysm Paternal Uncle    History  Substance Use Topics  . Smoking status: Current Every Day Smoker -- 1.00 packs/day    Types: Cigarettes  . Smokeless tobacco: Never Used  . Alcohol Use: Yes      Comment: occ    Review of Systems  Ten systems reviewed and are negative for acute change, except as noted in the HPI.    Allergies  Review of patient's allergies indicates no known allergies.  Home Medications   Prior to Admission medications   Medication Sig Start Date End Date Taking? Authorizing Provider  Aspirin-Acetaminophen-Caffeine (GOODY HEADACHE PO) Take 1 packet by mouth every 6 (six) hours as needed (for pain/headache).    Yes Historical Provider, MD  HYDROcodone-acetaminophen (NORCO/VICODIN) 5-325 MG per tablet Take 1 tablet by mouth every 8 (eight) hours as needed (for pain).   Yes Historical Provider, MD  promethazine (PHENERGAN) 25 MG tablet Take 25 mg by mouth every 6 (six) hours as needed for nausea.   Yes Historical Provider, MD  sulfamethoxazole-trimethoprim (BACTRIM DS,SEPTRA DS) 800-160 MG per tablet Take 1 tablet by mouth 2 (two) times daily. For 7 days 03/10/15  Yes Historical Provider, MD  tamsulosin (FLOMAX) 0.4 MG CAPS capsule Take 0.4 mg by mouth daily.   Yes Historical Provider, MD   BP 134/82 mmHg  Pulse 82  Temp(Src) 98.8 F (37.1 C) (Oral)  Resp 17  SpO2 97% Physical Exam  Constitutional: He appears well-developed and well-nourished. No distress.  HENT:  Head: Normocephalic and atraumatic.  Eyes: Conjunctivae are normal. No scleral icterus.  Neck: Normal range of motion.  Neck supple.  Cardiovascular: Normal rate, regular rhythm and normal heart sounds.   Pulmonary/Chest: Effort normal and breath sounds normal. No respiratory distress.  Thick rhonchorous cough.  Abdominal: Soft. He exhibits no distension and no mass. There is no tenderness. There is no guarding.  No cva tenderness  Musculoskeletal: He exhibits no edema.  Neurological: He is alert.  Skin: Skin is warm and dry. He is not diaphoretic.  Psychiatric: His behavior is normal.  Nursing note and vitals reviewed.   ED Course  Procedures (including critical care time) Labs  Review Labs Reviewed  BASIC METABOLIC PANEL - Abnormal; Notable for the following:    Glucose, Bld 107 (*)    Creatinine, Ser 1.96 (*)    GFR calc non Af Amer 34 (*)    GFR calc Af Amer 40 (*)    All other components within normal limits  CBC WITH DIFFERENTIAL/PLATELET - Abnormal; Notable for the following:    Neutrophils Relative % 83 (*)    Lymphocytes Relative 10 (*)    All other components within normal limits  URINALYSIS, ROUTINE W REFLEX MICROSCOPIC - Abnormal; Notable for the following:    APPearance CLOUDY (*)    Hgb urine dipstick SMALL (*)    Leukocytes, UA MODERATE (*)    All other components within normal limits  URINE MICROSCOPIC-ADD ON - Abnormal; Notable for the following:    Casts HYALINE CASTS (*)    All other components within normal limits  CULTURE, BLOOD (ROUTINE X 2)  CULTURE, BLOOD (ROUTINE X 2)  URINE CULTURE  I-STAT CG4 LACTIC ACID, ED    Imaging Review Dg Chest 2 View  03/11/2015   CLINICAL DATA:  Cough, congestion, and fever.  EXAM: CHEST  2 VIEW  COMPARISON:  06/15/2011  FINDINGS: No cardiomegaly. There is moderate aortic tortuosity which is stable. There is no edema, consolidation, effusion, or pneumothorax.  IMPRESSION: No active cardiopulmonary disease.   Electronically Signed   By: Monte Fantasia M.D.   On: 03/11/2015 09:53     EKG Interpretation None      MDM   Final diagnoses:  Cough    3:27 PM BP 134/82 mmHg  Pulse 82  Temp(Src) 98.8 F (37.1 C) (Oral)  Resp 17  SpO2 97% Patient here for evaluation of fever. Recently diagnosed with right kidney stone. Referred here by his urologist. Patient also has had a week of URI symptoms, myalgias, low-grade elevation in temperature. Had workup is pending. Would like to rule out pneumonia as a source of his infection. Otherwise, basic labs and a UA. He never really needs repeat imaging at this times.   3:27 PM BP 134/82 mmHg  Pulse 82  Temp(Src) 98.8 F (37.1 C) (Oral)  Resp 17  SpO2  97%'  Patient currently with UTI. I have spoken with Dr. Risa Grill who will take the patient to the OR for stent placement. NPO at this time.  Given 1 g IV rocephin.   I have spoken with Dr. Risa Grill who will admit the patient for infected ureterolithiasis, and an hydronephrosis. Patient stable throughout his visit here. Afebrile. Safe for admission  Margarita Mail, PA-C 03/12/15 [place], MD 03/16/15 [phone]

## 2015-03-11 NOTE — H&P (Signed)
Mr. Kueker presented to our office yesterday and was diagnosed with a 9 mm obstructing stone at the right ureteropelvic junction. He was seen by Dr. Matilde Sprang. At that time it was not felt that there was a urgent need for intervention. The patient presented to the emergency room at [facility] long with fever and chills. He is felt to have a concurrent eyebrow urinary tract infection along with an obstructing stone. His note from Dr. Sallee Provencal removed from yesterday including history and physical is below:  History of Present Illness   I was consulted by Dr Olin Hauser regarding Mr Lothrop's dysuria for 2 weeks and possible kidney stones. He has had some burning with urination off and on for 2 weeks. He has been going more frequently and getting up twice a night and more frequently during the day. At baseline, he does not void every 2 hours and has no nocturia. He denies a history of kidney stones, previous GU surgery, and urinary tract infections.  He had back surgery 20 years ago. He is not a diabetic. His bowel function is normal. He was given Lortab and Flomax at Urgent Care and sent over here and has not filled the prescription yet.   He has had a low-grade temperature off and on for a few days. He said on Monday he was also getting a sore throat and a cough.   In the last 24 hours he has had some right flank and right hip pain, which is new.   There are no other modifying factors or associated signs or symptoms. There are no other aggravating or relieving factors. His presentation is moderate in severity and ongoing.    Current Meds  1. No Reported Medications Recorded  Allergies Medication   1. No Known Drug Allergies  Social History Problems    Denied: History of Alcohol use   Current smoker (F17.200)   1 pack a day for 30 years as of 03/10/15 office visit   Daily caffeine consumption   3 per day   Married   Retired   Three children   daughters  Review of  Systems Constitutional, skin, eye, otolaryngeal, hematologic/lymphatic, cardiovascular, pulmonary, endocrine, musculoskeletal, gastrointestinal, neurological and psychiatric system(s) were reviewed and pertinent findings if present are noted and are otherwise negative.  Genitourinary: urinary frequency.    Vitals Vital Signs [Data Includes: Last 1 Day]  Recorded: [id number] 12:47PM  Height: 6 ft 2 in Weight: 200 lb  BMI Calculated: 25.68 BSA Calculated: 2.17 Blood Pressure: 126 / 74, Sitting Temperature: 97.8 F, Oral Heart Rate: 80 Respiration: 16  Physical Exam Constitutional: Well nourished and well developed . No acute distress.   ENT:. The ears and nose are normal in appearance.   Neck: The appearance of the neck is normal and no neck mass is present.   Pulmonary: No respiratory distress and normal respiratory rhythm and effort.   Cardiovascular: Heart rate and rhythm are normal . No peripheral edema.   Lymphatics: The femoral and inguinal nodes are not enlarged or tender.   Skin: Normal skin turgor, no visible rash and no visible skin lesions.   Neuro/Psych:. Mood and affect are appropriate.    . Abdomen:     No mass or tenderness.    . Genitourinary:   Minimal right CVA tenderness; one could argue that he looked bit uncomfortable; a 60 gram nontender benign prostate.    Results/Data    Today Mr Troop underwent a number of tests, which I personally reviewed.  Urinalysis: Few bacteria, positive red blood cells, urine sent for culture.  Review of Medical Records: He had red blood cells in his urine and was found to have dysuria and a questionable stone and was sent over by Urgent Care.  Urine [Data Includes: Last 1 Day]   [id number]  COLOR YELLOW   APPEARANCE CLOUDY   SPECIFIC GRAVITY 1.020   pH 5.0   GLUCOSE NEG mg/dL  BILIRUBIN NEG   KETONE NEG mg/dL  BLOOD TRACE   PROTEIN NEG mg/dL  UROBILINOGEN 0.2 mg/dL  NITRITE NEG   LEUKOCYTE ESTERASE SMALL    SQUAMOUS EPITHELIAL/HPF RARE   WBC TNTC WBC/hpf  RBC 3-6 RBC/hpf  BACTERIA FEW   CRYSTALS NONE SEEN   CASTS NONE SEEN    Assessment Assessed   1. Nocturia (R35.1)  2. Increased urinary frequency (R35.0)  3. Dysuria (R30.0)  Plan  Nephrolithiasis   1. Start: Promethazine HCl - 25 MG Oral Tablet; TAKE 1 TABLET Every  6 hours PRN  2. KUB; Status:Hold For - Date of Service; Requested for:18Mar2016;   3. URINE CULTURE; Status:In Progress - Specimen/Data Collected;   Done: [id number]  4. Follow-up Day x 2 Office  Follow-up  Status: Hold For - Date of Service  Requested for:  [id number]  Discussion/Summary   Mr Swayne had dysuria off and on for 2 weeks. He understands that we need to rule out a stone, but otherwise I would treat him as a prostatitis. He does have increased nocturia and frequency and has been having some dysuria. He was afebrile today in clinic. I noted he was also given a Bactrim prescription at Urgent Care.   I reviewed Mr Suazo's CT scan. I think he has a large left renal cyst and I will call if the report differs. He has moderate hydronephrosis to approximately a 1.1 cm stone in the proximal right ureter. I did not see any other abnormality or distal hydroureter.   I drew Mr Burridge a picture. He is going to fill the following prescriptions given to him at Urgent Care. He will fill Flomax and Bactrim, and he already has home Vicodin and he has a Lortab prescription. I gave him 20 Phenergan. Indication to go to the [place] given. He already has a strainer.   I made it very clear to Mr Popper, who lives here locally, that I do not think he needed the stent today for a "infected stone", but to come immediately to [facility] if he developed a fever or pain, or pain that he cannot handle. I am going to call him in the morning to make sure that he is doing okay and he knows I am on call tonight. He was very clear with my instructions. I am going to see him  on Friday with a KUB and we may need to line him up for lithotripsy next week, based upon the size of the stone. If his urine comes back positive, I will have to consider timing of lithotripsy and/or stent, since I do not want treatment-related infection.   I am going to send a copy of my note to Dr Julienne Kass to keep him updated on his treatment course.  cc: Lorene Dy, MD

## 2015-03-11 NOTE — ED Notes (Signed)
Pt transported to OR at this time  

## 2015-03-12 ENCOUNTER — Encounter (HOSPITAL_COMMUNITY): Payer: Self-pay | Admitting: Urology

## 2015-03-12 LAB — BASIC METABOLIC PANEL
ANION GAP: 9 (ref 5–15)
BUN: 19 mg/dL (ref 6–23)
CALCIUM: 8.2 mg/dL — AB (ref 8.4–10.5)
CO2: 24 mmol/L (ref 19–32)
Chloride: 104 mmol/L (ref 96–112)
Creatinine, Ser: 1.65 mg/dL — ABNORMAL HIGH (ref 0.50–1.35)
GFR calc Af Amer: 49 mL/min — ABNORMAL LOW (ref >90)
GFR calc non Af Amer: 42 mL/min — ABNORMAL LOW (ref >90)
GLUCOSE: 115 mg/dL — AB (ref 70–99)
Potassium: 3.9 mmol/L (ref 3.5–5.1)
Sodium: 137 mmol/L (ref 135–145)

## 2015-03-12 LAB — URINE CULTURE
CULTURE: NO GROWTH
Colony Count: NO GROWTH

## 2015-03-12 LAB — CBC
HCT: 41.6 % (ref 39.0–52.0)
Hemoglobin: 14 g/dL (ref 13.0–17.0)
MCH: 32.4 pg (ref 26.0–34.0)
MCHC: 33.7 g/dL (ref 30.0–36.0)
MCV: 96.3 fL (ref 78.0–100.0)
Platelets: 144 10*3/uL — ABNORMAL LOW (ref 150–400)
RBC: 4.32 MIL/uL (ref 4.22–5.81)
RDW: 12.4 % (ref 11.5–15.5)
WBC: 7.3 10*3/uL (ref 4.0–10.5)

## 2015-03-12 MED ORDER — LEVOFLOXACIN 500 MG PO TABS
500.0000 mg | ORAL_TABLET | Freq: Every day | ORAL | Status: DC
  Administered 2015-03-12 – 2015-03-13 (×2): 500 mg via ORAL
  Filled 2015-03-12 (×2): qty 1

## 2015-03-12 NOTE — Progress Notes (Signed)
Blood c/s negative and urine pending Low grade temp noted Was not symptomatic with low grade temp Pulls a bit stent with voiding Plan to dc home tomorrow on po antibioitc pending c/s i will arrange f/up by phone next week He has rx for pain/nausea at home

## 2015-03-12 NOTE — Care Management Note (Signed)
    Page 1 of 1   03/12/2015     1:36:19 PM CARE MANAGEMENT NOTE 03/12/2015  Patient:  Ethan [name], Ethan [name]   Account Number:  [id number]  Date Initiated:  03/12/2015  Documentation initiated by:  Dessa Phi  Subjective/Objective Assessment:   64y/o m admitted w/r ureteral calculus     Action/Plan:   From home.   Anticipated DC Date:  03/12/2015   Anticipated DC Plan:  [place]  CM consult      Choice offered to / List presented to:             Status of service:  In process, will continue to follow Medicare Important Message given?   (If response is "NO", the following Medicare IM given date fields will be blank) Date Medicare IM given:   Medicare IM given by:   Date Additional Medicare IM given:   Additional Medicare IM given by:    Discharge Disposition:    Per UR Regulation:  Reviewed for med. necessity/level of care/duration of stay  If discussed at [place] of Stay Meetings, dates discussed:    Comments:  03/12/15 Dessa Phi RN BSN [phone] No anticipated d/c needs.

## 2015-03-12 NOTE — Progress Notes (Signed)
UR completed 

## 2015-03-13 NOTE — Progress Notes (Signed)
Patient ID: Ethan [name], male   DOB: 21-Sep-1951, 64 y.o.   MRN: [id number]  2 Days Post-Op Subjective: Pt feeling better.  Afebrile this morning.   Objective: Vital signs in last 24 hours: Temp:  [98.1 F (36.7 C)-100.5 F (38.1 C)] 98.1 F (36.7 C) (03/19 0549) Pulse Rate:  [73-86] 74 (03/19 0549) Resp:  [20] 20 (03/19 0549) BP: (139-149)/(76-82) 139/82 mmHg (03/19 0549) SpO2:  [96 %-98 %] 96 % (03/19 0549)  Intake/Output from previous day: 03/18 0701 - 03/19 0700 In: 1966 [P.O.:240; I.V.:1725] Out: 1050 [Urine:1050] Intake/Output this shift:    Physical Exam:  General: Alert and oriented Abdomen: Soft, ND, Minimal CVAT  Lab Results:  Recent Labs  03/11/15 0808 03/11/15 1830 03/12/15 0505  HGB 15.8 14.2 14.0  HCT 45.7 42.1 41.6    Lab Results  Component Value Date   WBC 7.3 03/12/2015   HGB 14.0 03/12/2015   HCT 41.6 03/12/2015   MCV 96.3 03/12/2015   PLT 144* 03/12/2015     BMET  Recent Labs  03/11/15 0808 03/11/15 1830 03/12/15 0505  NA 135  --  137  K 3.8  --  3.9  CL 105  --  104  CO2 22  --  24  GLUCOSE 107*  --  115*  BUN 20  --  19  CREATININE 1.96* 1.75* 1.65*  CALCIUM 8.4  --  8.2*     Studies/Results: Dg Chest 2 View  03/11/2015   CLINICAL DATA:  Cough, congestion, and fever.  EXAM: CHEST  2 VIEW  COMPARISON:  06/15/2011  FINDINGS: No cardiomegaly. There is moderate aortic tortuosity which is stable. There is no edema, consolidation, effusion, or pneumothorax.  IMPRESSION: No active cardiopulmonary disease.   Electronically Signed   By: Monte Fantasia M.D.   On: 03/11/2015 09:53    Assessment/Plan: - Pt has been recommended to continue Levaquin for 10 days per Dr. Matilde Sprang.  Will discharge home. Pt instructed to call back should he develop fever of 101.5 or greater. F/U with Dr. Matilde Sprang.     Nickey Canedo,LES 03/13/2015, 8:44 AM

## 2015-03-13 NOTE — Discharge Instructions (Signed)
I have reviewed discharge instructions in detail with the patient. They will follow-up with me or their physician as scheduled. My nurse will also be calling the patients as per protocol.   Pt should call if he develops fever > 101.5.

## 2015-03-13 NOTE — Discharge Summary (Signed)
  Date of admission: 03/11/2015  Date of discharge: 03/13/2015  Admission diagnosis: Right ureteral stone, pyelonephritis  Discharge diagnosis: Same as above  History and Physical: For full details, please see admission history and physical. Briefly, Ethan [name] is a 64 y.o. year old patient with fever and an obstructing right ureteral stone.   Hospital Course: He was taken to the OR by Dr. Risa Grill on 03/11/15 and underwent urgent right ureteral stent placement.  He was maintained on IV ceftriaxone and his clinical symptoms improved.  By 03/13/15, he was afebrile and his urine culture demonstrated no growth.  He was switched to po Levaquin on 3/18 and continued to clinically improve and Dr. Matilde Sprang recommended discharge on po Levaquin for 10 days.  Laboratory values:  Recent Labs  03/11/15 0808 03/11/15 1830 03/12/15 0505  HGB 15.8 14.2 14.0  HCT 45.7 42.1 41.6    Recent Labs  03/11/15 1830 03/12/15 0505  CREATININE 1.75* 1.65*    Disposition: Home  Discharge instruction: No activity or dietary restrictions.  Discharge medications:    Medication List    STOP taking these medications        sulfamethoxazole-trimethoprim 800-160 MG per tablet  Commonly known as:  BACTRIM DS,SEPTRA DS      TAKE these medications        GOODY HEADACHE PO  Take 1 packet by mouth every 6 (six) hours as needed (for pain/headache).     HYDROcodone-acetaminophen 5-325 MG per tablet  Commonly known as:  NORCO/VICODIN  Take 1 tablet by mouth every 8 (eight) hours as needed (for pain).     promethazine 25 MG tablet  Commonly known as:  PHENERGAN  Take 25 mg by mouth every 6 (six) hours as needed for nausea.     tamsulosin 0.4 MG Caps capsule  Commonly known as:  FLOMAX  Take 0.4 mg by mouth daily.        Followup:      Follow-up Information    Follow up with MACDIARMID,SCOTT A, MD.   Specialty:  Urology   Why:  as scheduled   Contact information:   [place] [address] [phone]

## 2015-03-15 NOTE — Anesthesia Postprocedure Evaluation (Signed)
  Anesthesia Post-op Note  Patient: Ethan [name]  Procedure(s) Performed: Procedure(s): CYSTOSCOPY WITH RETROGRADE PYELOGRAM, AND RIGHT URETERAL STENT PLACEMENT (Right)  Patient Location: PACU  Anesthesia Type:General  Level of Consciousness: awake and alert   Airway and Oxygen Therapy: Patient Spontanous Breathing  Post-op Pain: none  Post-op Assessment: Post-op Vital signs reviewed  Post-op Vital Signs: stable  Last Vitals:  Filed Vitals:   03/13/15 0549  BP: 139/82  Pulse: 74  Temp: 36.7 C  Resp: 20    Complications: No apparent anesthesia complications

## 2015-03-17 LAB — CULTURE, BLOOD (ROUTINE X 2)
CULTURE: NO GROWTH
Culture: NO GROWTH

## 2015-03-31 ENCOUNTER — Other Ambulatory Visit: Payer: Self-pay | Admitting: Urology

## 2015-04-02 ENCOUNTER — Encounter (HOSPITAL_COMMUNITY): Payer: Self-pay | Admitting: *Deleted

## 2015-04-02 NOTE — Progress Notes (Signed)
Patient denies any chest pain or cardiac history.

## 2015-04-06 ENCOUNTER — Other Ambulatory Visit: Payer: Self-pay | Admitting: Urology

## 2015-04-07 NOTE — Progress Notes (Signed)
Modified pre-litho call complete, based on previous preprocedure call completed last week. Reviewed arrival time/ date, meds, NPO status, driver present, paperwork, and bowel prep. Pt verbalizes understanding.

## 2015-04-09 NOTE — H&P (Signed)
History of Present Illness   [name] was brought in today to reassess him clinically and to check another urine culture. I also wanted to check his vitals, etc.   Review of Systems: No change in bowel or neurologic systems.   Urinalysis: Positive bacteria. Urine sent for culture.    Surgical History Problems  1. History of Cystoscopy With Insertion Of Ureteral Stent Right 2. History of Surgery For Abdominal Aortic Aneurysm  Current Meds 1. Ampicillin 250 MG Oral Capsule; TAKE 1 CAPSULE 4 times daily;  Therapy: 08Apr2016 to (Evaluate:15Apr2016)  Requested for: 08Apr2016; Last  Rx:08Apr2016 Ordered 2. Hydrocodone-Acetaminophen 5-325 MG Oral Tablet; Take 1 tablet every 4 - 6 hours as  needed;  Therapy: 06Apr2016 to (Evaluate:16Apr2016); Last Rx:06Apr2016 Ordered 3. Oxycodone-Acetaminophen 5-325 MG Oral Tablet; Take 1-2 tablets every 4-6 hrs;  Therapy: [id number] to (Complete:22Apr2016); Last Rx:23Mar2016 Ordered 4. Promethazine HCl - 25 MG Oral Tablet; TAKE 1 TABLET Every  6 hours PRN;  Therapy: [id number] to (Last Rx:16Mar2016) Ordered  Allergies Medication  1. No Known Drug Allergies  Family History Problems  1. Family history of abdominal aortic aneurysm (Z82.49) : Father 2. Family history of cerebrovascular accident (CVA) (Z82.3) : Mother  Social History Problems  1. Denied: History of Alcohol use 2. Current smoker (F17.200)   1 pack a day for 30 years as of 03/10/15 office visit 3. Daily caffeine consumption   3 per day 4. Married 5. Retired 39. Three children   daughters  Vitals Vital Signs [Data Includes: Last 1 Day]  Recorded: 12Apr2016 01:38PM  Blood Pressure: 120 / 82 Temperature: 98 F Heart Rate: 71  Results/Data  Urine [Data Includes: Last 1 Day]   12Apr2016  COLOR YELLOW   APPEARANCE CLEAR   SPECIFIC GRAVITY 1.025   pH 5.0   GLUCOSE NEG mg/dL  BILIRUBIN NEG   KETONE NEG mg/dL  BLOOD LARGE   PROTEIN 30 mg/dL  UROBILINOGEN 0.2 mg/dL   NITRITE NEG   LEUKOCYTE ESTERASE SMALL   SQUAMOUS EPITHELIAL/HPF FEW   WBC 11-20 WBC/hpf  RBC TNTC RBC/hpf  BACTERIA MODERATE   CRYSTALS NONE SEEN   CASTS NONE SEEN   Other AMORPHOUS,MUCUS    Assessment Assessed  1. Increased urinary frequency (R35.0)  Plan Ureteral stone  1. Start: Levofloxacin 500 MG Oral Tablet (Levaquin); TAKE 1 TABLET DAILY AS DIRECTED  Discussion/Summary   [name]'s temperature finally decreased 100 last Friday and Saturday and it broke over the weekend. He has been afebrile taking his ampicillin 4 times a day. I do note the dosage. He runs out on Thursday. I am going to start him on Levaquin 500 mg a day today at double therapy for a total of 10 days, so he will have medicine after the treatment.   He feels good now. He said his urine is clear now. I will call if the culture is positive. I did not get a CT scan for an abscess. I want him to call me if he spikes another temperature. He is off his regular Tylenol.   I explained to [name] that I was really concerned about doing lithotripsy in the face of perhaps a parenchymal pyelonephritis infection.   After a thorough review of the management options for the patient's condition the patient  elected to proceed with surgical therapy as noted above. We have discussed the potential benefits and risks of the procedure, side effects of the proposed treatment, the likelihood of the patient achieving the goals of the procedure, and  any potential problems that might occur during the procedure or recuperation. Informed consent has been obtained.

## 2015-04-12 ENCOUNTER — Encounter (HOSPITAL_COMMUNITY)
Admission: RE | Disposition: A | Discharge status: Home / Self Care | Payer: Self-pay | Source: Ambulatory Visit | Attending: Urology

## 2015-04-12 ENCOUNTER — Ambulatory Visit (HOSPITAL_COMMUNITY): Payer: BLUE CROSS/BLUE SHIELD

## 2015-04-12 ENCOUNTER — Encounter (HOSPITAL_COMMUNITY): Payer: Self-pay | Admitting: *Deleted

## 2015-04-12 ENCOUNTER — Ambulatory Visit (HOSPITAL_COMMUNITY)
Admission: RE | Admit: 2015-04-12 | Discharge: 2015-04-12 | Disposition: A | Discharge status: Home / Self Care | Payer: BLUE CROSS/BLUE SHIELD | Source: Ambulatory Visit | Attending: Urology | Admitting: Urology

## 2015-04-12 DIAGNOSIS — Z79899 Other long term (current) drug therapy: Secondary | ICD-10-CM | POA: Insufficient documentation

## 2015-04-12 DIAGNOSIS — F17210 Nicotine dependence, cigarettes, uncomplicated: Secondary | ICD-10-CM | POA: Insufficient documentation

## 2015-04-12 DIAGNOSIS — N201 Calculus of ureter: Secondary | ICD-10-CM | POA: Insufficient documentation

## 2015-04-12 SURGERY — LITHOTRIPSY, ESWL
Anesthesia: LOCAL | Laterality: Right

## 2015-04-12 MED ORDER — DEXTROSE-NACL 5-0.45 % IV SOLN
INTRAVENOUS | Status: DC
  Administered 2015-04-12 (×2): via INTRAVENOUS

## 2015-04-12 MED ORDER — DIAZEPAM 5 MG PO TABS
10.0000 mg | ORAL_TABLET | ORAL | Status: AC
  Administered 2015-04-12: 10 mg via ORAL
  Filled 2015-04-12: qty 2

## 2015-04-12 MED ORDER — BUPIVACAINE HCL (PF) 0.25 % IJ SOLN
INTRAMUSCULAR | Status: AC
  Filled 2015-04-12: qty 30

## 2015-04-12 MED ORDER — CIPROFLOXACIN HCL 500 MG PO TABS
500.0000 mg | ORAL_TABLET | ORAL | Status: AC
  Administered 2015-04-12: 500 mg via ORAL
  Filled 2015-04-12: qty 1

## 2015-04-12 MED ORDER — DIPHENHYDRAMINE HCL 25 MG PO CAPS
25.0000 mg | ORAL_CAPSULE | ORAL | Status: AC
  Administered 2015-04-12: 25 mg via ORAL
  Filled 2015-04-12: qty 1

## 2015-04-12 NOTE — Progress Notes (Signed)
Per MD order post litho bladder scan done. Patient voided post litho and 350cc in bladder scan. After 20-30 minutes patient voided again and the bladder scan post void showed 250 cc. MD called per order and information given. No orders given and patient d/c home with wife.

## 2015-04-12 NOTE — Interval H&P Note (Signed)
History and Physical Interval Note:  04/12/2015 7:32 AM  Ethan [name]  has presented today for surgery, with the diagnosis of right ureteral stone  The various methods of treatment have been discussed with the patient and family. After consideration of risks, benefits and other options for treatment, the patient has consented to  Procedure(s): RIGHT EXTRACORPOREAL SHOCK WAVE LITHOTRIPSY (ESWL) (Right) as a surgical intervention .  The patient's history has been reviewed, patient examined, no change in status, stable for surgery.  I have reviewed the patient's chart and labs.  Questions were answered to the patient's satisfaction.     Satoshi Kalas A

## 2015-04-12 NOTE — Discharge Instructions (Signed)
I have reviewed discharge instructions in detail with the patient. They will follow-up with me or their physician as scheduled. My nurse will also be calling the patients as per protocol.   

## 2015-11-16 DIAGNOSIS — R071 Chest pain on breathing: Secondary | ICD-10-CM | POA: Diagnosis not present

## 2015-11-16 DIAGNOSIS — J918 Pleural effusion in other conditions classified elsewhere: Secondary | ICD-10-CM | POA: Diagnosis not present

## 2016-01-07 ENCOUNTER — Encounter: Payer: Self-pay | Admitting: Surgery

## 2016-01-17 ENCOUNTER — Ambulatory Visit (HOSPITAL_COMMUNITY)
Admission: RE | Admit: 2016-01-17 | Discharge: 2016-01-17 | Disposition: A | Discharge status: Home / Self Care | Payer: Medicare Other | Source: Ambulatory Visit | Attending: Surgery | Admitting: Surgery

## 2016-01-17 ENCOUNTER — Ambulatory Visit (INDEPENDENT_AMBULATORY_CARE_PROVIDER_SITE_OTHER): Payer: Medicare Other | Admitting: Surgery

## 2016-01-17 ENCOUNTER — Ambulatory Visit: Payer: BLUE CROSS/BLUE SHIELD | Admitting: Family

## 2016-01-17 ENCOUNTER — Other Ambulatory Visit (HOSPITAL_COMMUNITY): Payer: BLUE CROSS/BLUE SHIELD

## 2016-01-17 ENCOUNTER — Encounter: Payer: Self-pay | Admitting: Surgery

## 2016-01-17 VITALS — BP 128/87 | HR 68 | Temp 97.0°F | Resp 14 | Ht 75.0 in | Wt 205.0 lb

## 2016-01-17 DIAGNOSIS — I714 Abdominal aortic aneurysm, without rupture: Secondary | ICD-10-CM | POA: Insufficient documentation

## 2016-01-17 DIAGNOSIS — E785 Hyperlipidemia, unspecified: Secondary | ICD-10-CM | POA: Insufficient documentation

## 2016-01-17 DIAGNOSIS — Z95828 Presence of other vascular implants and grafts: Secondary | ICD-10-CM | POA: Diagnosis not present

## 2016-01-17 NOTE — Addendum Note (Signed)
Addended by: Dorthula Rue L on: 01/17/2016 02:53 PM   Modules accepted: Orders

## 2016-01-17 NOTE — Progress Notes (Signed)
Patient name: Ethan [name] MRN: [id number] DOB: 17-Oct-1951 Sex: male     Chief Complaint  Patient presents with  . AAA    EVAR follow up     HISTORY OF PRESENT ILLNESS: Patient comes in today for followup of his abdominal aortic aneurysm. He is status post percutaneous endovascular repair using a Gore Excluder device on 06/15/2011. His initial CT scan showed no evidence of endoleak.  He has no complaints today.  He has concerns regarding whether or not he can start niacin.  He is cutting back on cigarettes.  Past Medical History  Diagnosis Date  . AAA (abdominal aortic aneurysm) ([place])   . Hyperlipidemia   . Renal disorder     Past Surgical History  Procedure Laterality Date  . Right lower tib-fem fracture    . Hernia repair    . Lower back fusion, l2-l3,and l3-l4    . Spine surgery    . Abdominal aortic aneurysm repair  06-15-11    EVAR  . Shoulder surgery Bilateral   . Cystoscopy with retrograde pyelogram, ureteroscopy and stent placement Right 03/11/2015    Procedure: CYSTOSCOPY WITH RETROGRADE PYELOGRAM, AND RIGHT URETERAL STENT PLACEMENT;  Surgeon: Rana Snare, MD;  Location: [facility];  Service: Urology;  Laterality: Right;    Social History   Social History  . Marital Status: Married    Spouse Name: N/A  . Number of Children: N/A  . Years of Education: N/A   Occupational History  . Not on file.   Social History Main Topics  . Smoking status: Current Some Day Smoker -- 0.25 packs/day    Types: Cigarettes  . Smokeless tobacco: Never Used  . Alcohol Use: 0.0 oz/week    0 Standard drinks or equivalent per week     Comment: occ  . Drug Use: No  . Sexual Activity: Not on file   Other Topics Concern  . Not on file   Social History Narrative    Family History  Problem Relation Age of Onset  . Aneurysm Father   . Stroke Mother   . Heart disease Mother     Aneurysm  . Hypertension Sister   . Alcohol abuse Brother   . Cancer Brother     mouth  cancer  . Aneurysm Paternal Uncle     Allergies as of 01/17/2016  . (No Known Allergies)    Current Outpatient Prescriptions on File Prior to Visit  Medication Sig Dispense Refill  . HYDROcodone-acetaminophen (NORCO/VICODIN) 5-325 MG per tablet Take 1 tablet by mouth every 8 (eight) hours as needed (for pain).     No current facility-administered medications on file prior to visit.     REVIEW OF SYSTEMS: Cardiovascular: No chest pain, chest pressure, palpitations, orthopnea, or dyspnea on exertion. No claudication or rest pain,  No history of DVT or phlebitis. Pulmonary: No productive cough, asthma or wheezing. Neurologic: No weakness, paresthesias, aphasia, or amaurosis. No dizziness. Hematologic: No bleeding problems or clotting disorders. Musculoskeletal: No joint pain or joint swelling. Gastrointestinal: No blood in stool or hematemesis Genitourinary: No dysuria or hematuria. Psychiatric:: No history of major depression. Integumentary: No rashes or ulcers. Constitutional: No fever or chills.  PHYSICAL EXAMINATION:   Vital signs are  Filed Vitals:   01/17/16 0907  BP: 128/87  Pulse: 68  Temp: 97 F (36.1 C)  TempSrc: Oral  Resp: 14  Height: 6\' 3"  (1.905 m)  Weight: 205 lb (92.987 kg)  SpO2: 100%   Body  mass index is 25.62 kg/(m^2). General: The patient appears their stated age. HEENT:  No gross abnormalities Pulmonary:  Non labored breathing Abdomen: Soft and non-tender Musculoskeletal: There are no major deformities. Neurologic: No focal weakness or paresthesias are detected, Skin: There are no ulcer or rashes noted. Psychiatric: The patient has normal affect. Cardiovascular: There is a regular rate and rhythm without significant murmur appreciated.  Palpable pedal pulses.  No carotid bruit   Diagnostic Studies I have ordered and reviewed his ultrasound.  This shows continued decrease in the size of his aneurysm sac.  It now measures 3.1 cm in maximum  diameter  Assessment: Status post endovascular aneurysm repair Plan: The patient continues to do well.  We will continue with annual surveillance.  I have encouraged him to speak with his primary care physician regarding starting back on a statin.  Eldridge Abrahams, M.D. [facility] [place] Office: [phone] Pager:  [phone]

## 2016-03-02 DIAGNOSIS — J181 Lobar pneumonia, unspecified organism: Secondary | ICD-10-CM | POA: Diagnosis not present

## 2016-03-02 DIAGNOSIS — J188 Other pneumonia, unspecified organism: Secondary | ICD-10-CM | POA: Diagnosis not present

## 2017-01-16 ENCOUNTER — Encounter: Payer: Self-pay | Admitting: Surgery

## 2017-01-22 ENCOUNTER — Ambulatory Visit (HOSPITAL_COMMUNITY)
Admission: RE | Admit: 2017-01-22 | Discharge: 2017-01-22 | Disposition: A | Discharge status: Home / Self Care | Payer: Medicare Other | Source: Ambulatory Visit | Attending: Surgery | Admitting: Surgery

## 2017-01-22 ENCOUNTER — Ambulatory Visit (INDEPENDENT_AMBULATORY_CARE_PROVIDER_SITE_OTHER): Payer: Medicare Other | Admitting: Surgery

## 2017-01-22 ENCOUNTER — Encounter: Payer: Self-pay | Admitting: Surgery

## 2017-01-22 VITALS — BP 120/87 | HR 77 | Temp 97.3°F | Resp 20 | Ht 75.0 in | Wt 208.1 lb

## 2017-01-22 DIAGNOSIS — Z95828 Presence of other vascular implants and grafts: Secondary | ICD-10-CM | POA: Diagnosis not present

## 2017-01-22 DIAGNOSIS — I714 Abdominal aortic aneurysm, without rupture, unspecified: Secondary | ICD-10-CM

## 2017-01-22 NOTE — Progress Notes (Signed)
[facility] [place]  Patient name: Ethan [name] MRN: [id number] DOB: 08-03-1951 Sex: male   REASON FOR VISIT:    Follow up AAA  HISOTRY OF PRESENT ILLNESS:    Ethan [name] is a 66 y.o. male returns today for follow-up.  He is status post endovascular repair of a abdominal aortic aneurysm on 06/15/2011.  He has a strong family history of aneurysmal disease including rupture, therefore we elected to repair his aneurysm at 4.8 cm.   PAST MEDICAL HISTORY:   Past Medical History:  Diagnosis Date  . AAA (abdominal aortic aneurysm) ([place])   . Hyperlipidemia   . Renal disorder      FAMILY HISTORY:   Family History  Problem Relation Age of Onset  . Aneurysm Father   . Stroke Mother   . Heart disease Mother     Aneurysm  . Hypertension Sister   . Alcohol abuse Brother   . Cancer Brother     mouth cancer  . Aneurysm Paternal Uncle     SOCIAL HISTORY:   Social History  Substance Use Topics  . Smoking status: Current Some Day Smoker    Packs/day: 0.50    Types: Cigarettes  . Smokeless tobacco: Never Used  . Alcohol use 0.0 oz/week     Comment: occ     ALLERGIES:   No Known Allergies   CURRENT MEDICATIONS:   Current Outpatient Prescriptions  Medication Sig Dispense Refill  . HYDROcodone-acetaminophen (NORCO/VICODIN) 5-325 MG per tablet Take 1 tablet by mouth every 8 (eight) hours as needed (for pain).     No current facility-administered medications for this visit.     REVIEW OF SYSTEMS:   [X]  denotes positive finding, [ ]  denotes negative finding Cardiac  Comments:  Chest pain or chest pressure:    Shortness of breath upon exertion:    Short of breath when lying flat:    Irregular heart rhythm:        Vascular    Pain in calf, thigh, or hip brought on by ambulation:    Pain in feet at night that wakes you up from your sleep:     Blood clot in your veins:    Leg swelling:           Pulmonary    Oxygen at home:    Productive cough:     Wheezing:         Neurologic    Sudden weakness in arms or legs:     Sudden numbness in arms or legs:     Sudden onset of difficulty speaking or slurred speech:    Temporary loss of vision in one eye:     Problems with dizziness:         Gastrointestinal    Blood in stool:     Vomited blood:         Genitourinary    Burning when urinating:     Blood in urine:        Psychiatric    Major depression:         Hematologic    Bleeding problems:    Problems with blood clotting too easily:        Skin    Rashes or ulcers:        Constitutional    Fever or chills:      PHYSICAL EXAM:   Vitals:   01/22/17 0849  BP: 120/87  Pulse: 77  Resp: 20  Temp:  97.3 F (36.3 C)  TempSrc: Oral  SpO2: 96%  Weight: 208 lb 1.6 oz (94.4 kg)  Height: 6\' 3"  (1.905 m)    GENERAL: The patient is a well-nourished male, in no acute distress. The vital signs are documented above. CARDIAC: There is a regular rate and rhythm.  PULMONARY: Non-labored respirations ABDOMEN: Soft and non-tender.  MUSCULOSKELETAL: There are no major deformities or cyanosis. NEUROLOGIC: No focal weakness or paresthesias are detected. SKIN: There are no ulcers or rashes noted. PSYCHIATRIC: The patient has a normal affect.  STUDIES:   I have reviewed his ultrasound.  This shows the inability to visualize a residual aneurysm sac.  Stent was widely patent the left limb was not clearly visualized secondary to bowel gas  MEDICAL ISSUES:   Status post endovascular aneurysm.  By ultrasound, the aneurysm sac has essentially resolved.  At the patient's request We'll change his surveillance to every 2 years.    Annamarie Major, MD [facility] [phone] Pager [phone]

## 2017-01-24 NOTE — Addendum Note (Signed)
Addended by: Lianne Cure A on: 01/24/2017 04:55 PM   Modules accepted: Orders

## 2017-04-23 DIAGNOSIS — R3914 Feeling of incomplete bladder emptying: Secondary | ICD-10-CM | POA: Diagnosis not present

## 2017-04-23 DIAGNOSIS — R351 Nocturia: Secondary | ICD-10-CM | POA: Diagnosis not present

## 2017-04-23 DIAGNOSIS — R3912 Poor urinary stream: Secondary | ICD-10-CM | POA: Diagnosis not present

## 2017-04-23 DIAGNOSIS — N401 Enlarged prostate with lower urinary tract symptoms: Secondary | ICD-10-CM | POA: Diagnosis not present

## 2017-05-08 DIAGNOSIS — J209 Acute bronchitis, unspecified: Secondary | ICD-10-CM | POA: Diagnosis not present

## 2017-05-23 DIAGNOSIS — R351 Nocturia: Secondary | ICD-10-CM | POA: Diagnosis not present

## 2017-05-23 DIAGNOSIS — N5201 Erectile dysfunction due to arterial insufficiency: Secondary | ICD-10-CM | POA: Diagnosis not present

## 2017-05-23 DIAGNOSIS — N401 Enlarged prostate with lower urinary tract symptoms: Secondary | ICD-10-CM | POA: Diagnosis not present

## 2017-05-23 DIAGNOSIS — R3915 Urgency of urination: Secondary | ICD-10-CM | POA: Diagnosis not present

## 2017-09-20 DIAGNOSIS — N401 Enlarged prostate with lower urinary tract symptoms: Secondary | ICD-10-CM | POA: Diagnosis not present

## 2017-09-20 DIAGNOSIS — R351 Nocturia: Secondary | ICD-10-CM | POA: Diagnosis not present

## 2017-11-07 DIAGNOSIS — N401 Enlarged prostate with lower urinary tract symptoms: Secondary | ICD-10-CM | POA: Diagnosis not present

## 2017-11-07 DIAGNOSIS — R351 Nocturia: Secondary | ICD-10-CM | POA: Diagnosis not present

## 2017-12-12 DIAGNOSIS — R300 Dysuria: Secondary | ICD-10-CM | POA: Diagnosis not present

## 2017-12-12 DIAGNOSIS — R351 Nocturia: Secondary | ICD-10-CM | POA: Diagnosis not present

## 2018-01-17 DIAGNOSIS — R350 Frequency of micturition: Secondary | ICD-10-CM | POA: Diagnosis not present

## 2018-07-09 DIAGNOSIS — M130 Polyarthritis, unspecified: Secondary | ICD-10-CM | POA: Diagnosis not present

## 2018-07-09 DIAGNOSIS — W57XXXA Bitten or stung by nonvenomous insect and other nonvenomous arthropods, initial encounter: Secondary | ICD-10-CM | POA: Diagnosis not present

## 2018-07-09 DIAGNOSIS — Z1211 Encounter for screening for malignant neoplasm of colon: Secondary | ICD-10-CM | POA: Diagnosis not present

## 2018-07-09 DIAGNOSIS — R51 Headache: Secondary | ICD-10-CM | POA: Diagnosis not present

## 2018-08-09 DIAGNOSIS — Z6824 Body mass index (BMI) 24.0-24.9, adult: Secondary | ICD-10-CM | POA: Diagnosis not present

## 2018-08-09 DIAGNOSIS — Z1331 Encounter for screening for depression: Secondary | ICD-10-CM | POA: Diagnosis not present

## 2018-08-09 DIAGNOSIS — Z9181 History of falling: Secondary | ICD-10-CM | POA: Diagnosis not present

## 2018-08-09 DIAGNOSIS — M130 Polyarthritis, unspecified: Secondary | ICD-10-CM | POA: Diagnosis not present

## 2018-08-09 DIAGNOSIS — Z0000 Encounter for general adult medical examination without abnormal findings: Secondary | ICD-10-CM | POA: Diagnosis not present

## 2018-08-09 DIAGNOSIS — A770 Spotted fever due to Rickettsia rickettsii: Secondary | ICD-10-CM | POA: Diagnosis not present

## 2018-08-09 DIAGNOSIS — Z1339 Encounter for screening examination for other mental health and behavioral disorders: Secondary | ICD-10-CM | POA: Diagnosis not present

## 2018-08-09 DIAGNOSIS — Z139 Encounter for screening, unspecified: Secondary | ICD-10-CM | POA: Diagnosis not present

## 2018-08-29 ENCOUNTER — Ambulatory Visit (INDEPENDENT_AMBULATORY_CARE_PROVIDER_SITE_OTHER): Payer: Medicare Other | Admitting: Internal Medicine

## 2018-08-29 ENCOUNTER — Encounter: Payer: Self-pay | Admitting: Internal Medicine

## 2018-08-29 DIAGNOSIS — M25541 Pain in joints of right hand: Secondary | ICD-10-CM

## 2018-08-29 DIAGNOSIS — M25542 Pain in joints of left hand: Secondary | ICD-10-CM | POA: Diagnosis not present

## 2018-08-29 DIAGNOSIS — R634 Abnormal weight loss: Secondary | ICD-10-CM | POA: Diagnosis not present

## 2018-08-29 DIAGNOSIS — M2550 Pain in unspecified joint: Secondary | ICD-10-CM | POA: Insufficient documentation

## 2018-08-29 MED ORDER — PREDNISONE 20 MG PO TABS: 20.0000 mg | ORAL_TABLET | Freq: Every day | ORAL | 0 refills | Status: DC

## 2018-08-29 NOTE — Progress Notes (Signed)
[facility]      Reason for Consult: ?RMSF    Referring Physician: Dr. Vanetta Shawl    Patient ID: Ethan [name], male    DOB: 05-13-1951, 67 y.o.   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  He relates a history of a tick bite in March of this year with progressive symptoms of joint pain, predominately in the shoulders bilateral and hands, particularly with some swelling of the pointer finger joint of the right hand.  They are mainly concerned with Lyme disease as they relate a history of a family member who died of undiagnosed Lyme disease noted at autopsy.  Lyme titers are negative and there have been no associated symptoms of Lyme disease including no facial droop, no arthritis and no signs of meningitis.  He also has not been to a Lyme endemic area.  He did not a target-like rash at the site of the tick bite after the initial bite that resolved.  He did get doxycycline for ? RMSF though did not have a history of headache, fever or rash.  During doxycycline treatment he did get a rash on his back that is puritic and now on his arms.    Previous record reviewed from his PCP and labs reviewed including negative ANA, CCP, normal CRP, ESR, normal TSH, uric acid.  His RMSF titer is 1:128 and was 1:256 the month before and IgM negative confirming old disease.    Past Medical History:  Diagnosis Date  . AAA (abdominal aortic aneurysm) ([place])   . Hyperlipidemia   . Renal disorder     Prior to Admission medications   Medication Sig Start Date End Date Taking? Authorizing Provider  HYDROcodone-acetaminophen (NORCO/VICODIN) 5-325 MG per tablet Take 1 tablet by mouth every 8 (eight) hours as needed (for pain).    [provider]  meloxicam (MOBIC) 15 MG tablet  08/13/18   [provider]  predniSONE (DELTASONE) 20 MG tablet Take 1 tablet (20 mg total) by mouth daily with breakfast.  08/29/18   Comer, Okey Regal, MD    No Known Allergies  Social History   Tobacco Use  . Smoking status: Current Some Day Smoker    Packs/day: 0.50    Types: Cigarettes  . Smokeless tobacco: Never Used  Substance Use Topics  . Alcohol use: Yes    Alcohol/week: 0.0 standard drinks    Comment: occ  . Drug use: No    Family History  Problem Relation Age of Onset  . Aneurysm Father   . Stroke Mother   . Heart disease Mother        Aneurysm  . Hypertension Sister   . Alcohol abuse Brother   . Cancer Brother        mouth cancer  . Aneurysm Paternal Uncle     Review of Systems  Constitutional: positive for malaise and weight loss or negative for fevers and chills Gastrointestinal: negative for nausea and diarrhea Integument/breast: positive for rash and pruritus Hematologic/lymphatic: negative for lymphadenopathy and petechiae Musculoskeletal: positive for myalgias and arthralgias, negative for arthritis, joint swelling; did note bursitis which has resolved Neurological: negative for headaches All other systems reviewed and are negative    Constitutional: in no apparent distress and alert  Vitals:   08/29/18 0847  BP: (!) 143/88  Pulse: 66  Temp: 98 F (36.7 C)   EYES: anicteric  ENMT: no thrush Cardiovascular: Cor RRR Respiratory: CTA B; normal respiratory effort Musculoskeletal: peripheral pulses normal, no pedal edema, no clubbing or cyanosis, no swelling of hands, no knee effusion; shoulders with limited movement due to pain, increased from the baseline Skin: mild erythematous rash on back, signs of scratching Hematologic: no cervical, axillary or supraclavicular lad  Labs: Lab Results  Component Value Date   WBC 7.3 03/12/2015   HGB 14.0 03/12/2015   HCT 41.6 03/12/2015   MCV 96.3 03/12/2015   PLT 144 (L) 03/12/2015    Lab Results  Component Value Date   CREATININE 1.65 (H) 03/12/2015   BUN 19 03/12/2015   NA 137 03/12/2015   K 3.9 03/12/2015   CL 104  03/12/2015   CO2 24 03/12/2015    Lab Results  Component Value Date   ALT 10 06/15/2011   AST 19 06/15/2011   ALKPHOS 72 06/15/2011   BILITOT 0.3 06/15/2011   INR 1.25 06/15/2011     Assessment: Unclear etiology of his symptoms.  I am most concerned that he has lost weight without intention, about 6 or more pounds in the last 1-2 weeks, though no concerning signs of lyphadenopathy, CBC wnl.  RMSF unlikely as a cause as no typical symptoms were associated with his illness.  He has not been to a Lyme-endemic location so would not be a cause and testing not indicated (though was done and negative).   The differential is post infectious reactive arthritis, rheumatologic disease, less likely is causes such as lymphoma, leukemia,    Plan: 1) prednisone 20 mg daily for 5 days 2) follow up with your primary care physician on Monday to check symptoms and monitor weight If reactive, symptoms should improve with steroids and diminish some, and over the next 1-2 months resolve; if rheumatologic, symptoms would flare again after steroids are completed.   I would recommend further work up if he continues to lose weight over the next 2-3 weeks including CT C/A/P and repeat CBC, CMP, LDH  We are available for follow up if needed Thanks for the referral

## 2018-08-30 ENCOUNTER — Other Ambulatory Visit: Payer: Self-pay

## 2018-08-30 ENCOUNTER — Telehealth: Payer: Self-pay

## 2018-08-30 MED ORDER — PREDNISONE 20 MG PO TABS: 20.0000 mg | ORAL_TABLET | Freq: Every day | ORAL | 0 refills | Status: DC

## 2018-08-30 NOTE — Telephone Encounter (Signed)
Patient left message to inform office his prescription was sent to the wrong pharmacy.  Would like his Prednisone called into [facility] in [place]. Called in prescription per patient and canceled order sent to [facility], [place]

## 2018-09-02 ENCOUNTER — Telehealth: Payer: Self-pay | Admitting: Behavioral Health

## 2018-09-02 NOTE — Telephone Encounter (Signed)
-----   Message from [place] sent at 09/02/2018  9:12 AM EDT ----- Contact: [phone] Called returning call on regards to prescription being sent to the wrong pharmacy

## 2018-09-02 NOTE — Telephone Encounter (Signed)
Called patient, he states he has already picked up his medication from the pharmacy and the issue is resolved.  Patient had no further questions or concerns. Pricilla Riffle RN

## 2018-09-02 NOTE — Telephone Encounter (Signed)
-----   Message from [place] sent at 09/02/2018  9:12 AM EDT ----- Contact: [phone] Called returning call on regards to prescription being sent to the wrong pharmacy

## 2018-09-30 DIAGNOSIS — L309 Dermatitis, unspecified: Secondary | ICD-10-CM | POA: Diagnosis not present

## 2018-10-10 DIAGNOSIS — N401 Enlarged prostate with lower urinary tract symptoms: Secondary | ICD-10-CM | POA: Diagnosis not present

## 2018-10-10 DIAGNOSIS — N281 Cyst of kidney, acquired: Secondary | ICD-10-CM | POA: Diagnosis not present

## 2018-10-10 DIAGNOSIS — R3915 Urgency of urination: Secondary | ICD-10-CM | POA: Diagnosis not present

## 2018-10-16 DIAGNOSIS — Z1211 Encounter for screening for malignant neoplasm of colon: Secondary | ICD-10-CM | POA: Diagnosis not present

## 2018-11-11 DIAGNOSIS — N401 Enlarged prostate with lower urinary tract symptoms: Secondary | ICD-10-CM | POA: Diagnosis not present

## 2018-11-11 DIAGNOSIS — R351 Nocturia: Secondary | ICD-10-CM | POA: Diagnosis not present

## 2019-02-06 ENCOUNTER — Other Ambulatory Visit: Payer: Self-pay

## 2019-02-10 ENCOUNTER — Ambulatory Visit (HOSPITAL_COMMUNITY)
Admission: RE | Admit: 2019-02-10 | Discharge: 2019-02-10 | Disposition: A | Discharge status: Home / Self Care | Payer: Medicare Other | Source: Ambulatory Visit | Attending: Surgery | Admitting: Surgery

## 2019-02-10 ENCOUNTER — Encounter: Payer: Self-pay | Admitting: Surgery

## 2019-02-10 ENCOUNTER — Other Ambulatory Visit: Payer: Self-pay

## 2019-02-10 ENCOUNTER — Ambulatory Visit (INDEPENDENT_AMBULATORY_CARE_PROVIDER_SITE_OTHER): Payer: Medicare Other | Admitting: Surgery

## 2019-02-10 VITALS — BP 137/84 | HR 69 | Temp 97.6°F | Resp 18 | Ht 75.0 in | Wt 200.0 lb

## 2019-02-10 DIAGNOSIS — I714 Abdominal aortic aneurysm, without rupture, unspecified: Secondary | ICD-10-CM

## 2019-02-10 NOTE — Progress Notes (Signed)
[facility]  Patient name: URA HAUSEN MRN: [id number] DOB: Apr 25, 1951 Sex: male   REASON FOR VISIT:    Follow up  HISOTRY OF PRESENT ILLNESS:    AMILCAR REEVER is a 68 y.o. male returns today for follow-up.  He is status post endovascular repair of a abdominal aortic aneurysm on 06/15/2011.  He has a strong family history of aneurysmal disease including rupture, therefore we elected to repair his aneurysm at 4.8 cm.  His sac has essentially resolved   PAST MEDICAL HISTORY:   Past Medical History:  Diagnosis Date  . AAA (abdominal aortic aneurysm) ([place])   . Hyperlipidemia   . Renal disorder      FAMILY HISTORY:   Family History  Problem Relation Age of Onset  . Aneurysm Father   . Stroke Mother   . Heart disease Mother        Aneurysm  . Hypertension Sister   . Alcohol abuse Brother   . Cancer Brother        mouth cancer  . Aneurysm Paternal Uncle     SOCIAL HISTORY:   Social History   Tobacco Use  . Smoking status: Current Some Day Smoker    Packs/day: 0.50    Types: Cigarettes  . Smokeless tobacco: Never Used  Substance Use Topics  . Alcohol use: Yes    Alcohol/week: 0.0 standard drinks    Comment: occ     ALLERGIES:   No Known Allergies   CURRENT MEDICATIONS:   Current Outpatient Medications  Medication Sig Dispense Refill  . finasteride (PROSCAR) 5 MG tablet Take 5 mg by mouth daily.    . tamsulosin (FLOMAX) 0.4 MG CAPS capsule     . augmented betamethasone dipropionate (DIPROLENE-AF) 0.05 % cream APPLY TO RASH ON THE SKIN TWICE DAILY FOR 2 3 WEEKS.    Marland Kitchen HYDROcodone-acetaminophen (NORCO/VICODIN) 5-325 MG per tablet Take 1 tablet by mouth every 8 (eight) hours as needed (for pain).    . meloxicam (MOBIC) 15 MG tablet     . predniSONE (DELTASONE) 20 MG tablet Take 1 tablet (20 mg total) by mouth daily with breakfast. (Patient not taking: Reported on 02/10/2019) 5 tablet  0   No current facility-administered medications for this visit.     REVIEW OF SYSTEMS:   [X]  denotes positive finding, [ ]  denotes negative finding Cardiac  Comments:  Chest pain or chest pressure:    Shortness of breath upon exertion:    Short of breath when lying flat:    Irregular heart rhythm:        Vascular    Pain in calf, thigh, or hip brought on by ambulation:    Pain in feet at night that wakes you up from your sleep:     Blood clot in your veins:    Leg swelling:         Pulmonary    Oxygen at home:    Productive cough:     Wheezing:         Neurologic    Sudden weakness in arms or legs:     Sudden numbness in arms or legs:     Sudden onset of difficulty speaking or slurred speech:    Temporary loss of vision in one eye:     Problems with dizziness:         Gastrointestinal    Blood in stool:     Vomited blood:  Genitourinary    Burning when urinating:     Blood in urine:        Psychiatric    Major depression:         Hematologic    Bleeding problems:    Problems with blood clotting too easily:        Skin    Rashes or ulcers:        Constitutional    Fever or chills:      PHYSICAL EXAM:   Vitals:   02/10/19 0849  BP: 137/84  Pulse: 69  Resp: 18  Temp: 97.6 F (36.4 C)  TempSrc: Oral  SpO2: 97%  Weight: 200 lb (90.7 kg)  Height: 6\' 3"  (1.905 m)    GENERAL: The patient is a well-nourished male, in no acute distress. The vital signs are documented above. CARDIAC: There is a regular rate and rhythm.  VASCULAR: no carotid bruits.  Palpable PT bilaterally  PULMONARY: Non-labored respirations ABDOMEN: Soft and non-tender with normal pitched bowel sounds.  MUSCULOSKELETAL: There are no major deformities or cyanosis. NEUROLOGIC: No focal weakness or paresthesias are detected. SKIN: There are no ulcers or rashes noted. PSYCHIATRIC: The patient has a normal affect.  STUDIES:   I have ordered and reviewed his ultrasound with  the following results: Endovascular Aortic Repair (EVAR): +----------+----------------+-------------------+-------------------+           Diameter AP (cm)Diameter Trans (cm)Velocities (cm/sec) +----------+----------------+-------------------+-------------------+ Aorta     3.08            3.15               54                  +----------+----------------+-------------------+-------------------+ Right Limb1.77            1.84               138                 +----------+----------------+-------------------+-------------------+ Left Limb 1.68            1.69               102                 +----------+----------------+-------------------+-------------------+   MEDICAL ISSUES:   AAA: Stable appearance of abdominal aorta.  Aneurysm sac is nearly resolved.  He will follow-up in 2 years with repeat ultrasound.  Carotid:  He had minimal stenosis 10 years ago.  I will repeat them in 2 years   Annamarie Major, MD [facility] [phone] Pager [phone]

## 2019-03-12 DIAGNOSIS — R3914 Feeling of incomplete bladder emptying: Secondary | ICD-10-CM | POA: Diagnosis not present

## 2019-03-12 DIAGNOSIS — R350 Frequency of micturition: Secondary | ICD-10-CM | POA: Diagnosis not present

## 2019-03-12 DIAGNOSIS — N401 Enlarged prostate with lower urinary tract symptoms: Secondary | ICD-10-CM | POA: Diagnosis not present

## 2019-03-12 DIAGNOSIS — N3000 Acute cystitis without hematuria: Secondary | ICD-10-CM | POA: Diagnosis not present

## 2019-04-03 DIAGNOSIS — N401 Enlarged prostate with lower urinary tract symptoms: Secondary | ICD-10-CM | POA: Diagnosis not present

## 2019-04-08 DIAGNOSIS — N410 Acute prostatitis: Secondary | ICD-10-CM | POA: Diagnosis not present

## 2019-04-08 DIAGNOSIS — R1031 Right lower quadrant pain: Secondary | ICD-10-CM | POA: Diagnosis not present

## 2019-04-08 DIAGNOSIS — N200 Calculus of kidney: Secondary | ICD-10-CM | POA: Diagnosis not present

## 2019-04-08 DIAGNOSIS — N390 Urinary tract infection, site not specified: Secondary | ICD-10-CM | POA: Diagnosis not present

## 2019-04-08 DIAGNOSIS — R300 Dysuria: Secondary | ICD-10-CM | POA: Diagnosis not present

## 2019-04-08 DIAGNOSIS — N400 Enlarged prostate without lower urinary tract symptoms: Secondary | ICD-10-CM | POA: Diagnosis not present

## 2019-04-08 DIAGNOSIS — K5900 Constipation, unspecified: Secondary | ICD-10-CM | POA: Diagnosis not present

## 2019-10-07 DIAGNOSIS — L821 Other seborrheic keratosis: Secondary | ICD-10-CM | POA: Diagnosis not present

## 2019-10-07 DIAGNOSIS — D225 Melanocytic nevi of trunk: Secondary | ICD-10-CM | POA: Diagnosis not present

## 2019-10-07 DIAGNOSIS — L570 Actinic keratosis: Secondary | ICD-10-CM | POA: Diagnosis not present

## 2019-10-07 DIAGNOSIS — L820 Inflamed seborrheic keratosis: Secondary | ICD-10-CM | POA: Diagnosis not present

## 2019-10-07 DIAGNOSIS — D2239 Melanocytic nevi of other parts of face: Secondary | ICD-10-CM | POA: Diagnosis not present

## 2019-10-07 DIAGNOSIS — L309 Dermatitis, unspecified: Secondary | ICD-10-CM | POA: Diagnosis not present

## 2019-10-07 DIAGNOSIS — D224 Melanocytic nevi of scalp and neck: Secondary | ICD-10-CM | POA: Diagnosis not present

## 2019-10-07 DIAGNOSIS — D1801 Hemangioma of skin and subcutaneous tissue: Secondary | ICD-10-CM | POA: Diagnosis not present

## 2019-11-17 DIAGNOSIS — Z20828 Contact with and (suspected) exposure to other viral communicable diseases: Secondary | ICD-10-CM | POA: Diagnosis not present

## 2020-04-14 DIAGNOSIS — N401 Enlarged prostate with lower urinary tract symptoms: Secondary | ICD-10-CM | POA: Diagnosis not present

## 2020-04-14 DIAGNOSIS — N410 Acute prostatitis: Secondary | ICD-10-CM | POA: Diagnosis not present

## 2020-04-14 DIAGNOSIS — R350 Frequency of micturition: Secondary | ICD-10-CM | POA: Diagnosis not present

## 2020-04-21 DIAGNOSIS — R1084 Generalized abdominal pain: Secondary | ICD-10-CM | POA: Diagnosis not present

## 2020-04-21 DIAGNOSIS — N200 Calculus of kidney: Secondary | ICD-10-CM | POA: Diagnosis not present

## 2020-04-21 DIAGNOSIS — N411 Chronic prostatitis: Secondary | ICD-10-CM | POA: Diagnosis not present

## 2020-05-12 DIAGNOSIS — R109 Unspecified abdominal pain: Secondary | ICD-10-CM | POA: Diagnosis not present

## 2020-05-12 DIAGNOSIS — N410 Acute prostatitis: Secondary | ICD-10-CM | POA: Diagnosis not present

## 2020-05-12 DIAGNOSIS — Z6825 Body mass index (BMI) 25.0-25.9, adult: Secondary | ICD-10-CM | POA: Diagnosis not present

## 2020-10-07 DIAGNOSIS — D225 Melanocytic nevi of trunk: Secondary | ICD-10-CM | POA: Diagnosis not present

## 2020-10-07 DIAGNOSIS — D2261 Melanocytic nevi of right upper limb, including shoulder: Secondary | ICD-10-CM | POA: Diagnosis not present

## 2020-10-07 DIAGNOSIS — L821 Other seborrheic keratosis: Secondary | ICD-10-CM | POA: Diagnosis not present

## 2020-10-07 DIAGNOSIS — L570 Actinic keratosis: Secondary | ICD-10-CM | POA: Diagnosis not present

## 2020-10-07 DIAGNOSIS — L309 Dermatitis, unspecified: Secondary | ICD-10-CM | POA: Diagnosis not present

## 2020-11-30 DIAGNOSIS — N3001 Acute cystitis with hematuria: Secondary | ICD-10-CM | POA: Diagnosis not present

## 2020-11-30 DIAGNOSIS — N401 Enlarged prostate with lower urinary tract symptoms: Secondary | ICD-10-CM | POA: Diagnosis not present

## 2020-11-30 DIAGNOSIS — N3090 Cystitis, unspecified without hematuria: Secondary | ICD-10-CM | POA: Diagnosis not present

## 2020-11-30 DIAGNOSIS — R300 Dysuria: Secondary | ICD-10-CM | POA: Diagnosis not present

## 2021-01-04 DIAGNOSIS — N3000 Acute cystitis without hematuria: Secondary | ICD-10-CM | POA: Diagnosis not present

## 2021-01-17 DIAGNOSIS — N3000 Acute cystitis without hematuria: Secondary | ICD-10-CM | POA: Diagnosis not present

## 2021-01-17 DIAGNOSIS — R300 Dysuria: Secondary | ICD-10-CM | POA: Diagnosis not present

## 2021-01-17 DIAGNOSIS — N400 Enlarged prostate without lower urinary tract symptoms: Secondary | ICD-10-CM | POA: Diagnosis not present

## 2021-03-02 DIAGNOSIS — N401 Enlarged prostate with lower urinary tract symptoms: Secondary | ICD-10-CM | POA: Diagnosis not present

## 2021-03-02 DIAGNOSIS — R350 Frequency of micturition: Secondary | ICD-10-CM | POA: Diagnosis not present

## 2021-03-02 DIAGNOSIS — R972 Elevated prostate specific antigen [PSA]: Secondary | ICD-10-CM | POA: Diagnosis not present

## 2021-03-02 DIAGNOSIS — N411 Chronic prostatitis: Secondary | ICD-10-CM | POA: Diagnosis not present

## 2021-06-23 DIAGNOSIS — R972 Elevated prostate specific antigen [PSA]: Secondary | ICD-10-CM | POA: Diagnosis not present

## 2021-06-30 DIAGNOSIS — N410 Acute prostatitis: Secondary | ICD-10-CM | POA: Diagnosis not present

## 2021-06-30 DIAGNOSIS — R351 Nocturia: Secondary | ICD-10-CM | POA: Diagnosis not present

## 2021-06-30 DIAGNOSIS — N401 Enlarged prostate with lower urinary tract symptoms: Secondary | ICD-10-CM | POA: Diagnosis not present

## 2021-06-30 DIAGNOSIS — R300 Dysuria: Secondary | ICD-10-CM | POA: Diagnosis not present

## 2021-07-04 DIAGNOSIS — U071 COVID-19: Secondary | ICD-10-CM | POA: Diagnosis not present

## 2021-08-10 DIAGNOSIS — N3000 Acute cystitis without hematuria: Secondary | ICD-10-CM | POA: Diagnosis not present

## 2021-08-10 DIAGNOSIS — R300 Dysuria: Secondary | ICD-10-CM | POA: Diagnosis not present

## 2021-08-19 DIAGNOSIS — U071 COVID-19: Secondary | ICD-10-CM | POA: Diagnosis not present

## 2021-10-10 DIAGNOSIS — D225 Melanocytic nevi of trunk: Secondary | ICD-10-CM | POA: Diagnosis not present

## 2021-10-10 DIAGNOSIS — D692 Other nonthrombocytopenic purpura: Secondary | ICD-10-CM | POA: Diagnosis not present

## 2021-10-10 DIAGNOSIS — L821 Other seborrheic keratosis: Secondary | ICD-10-CM | POA: Diagnosis not present

## 2021-10-10 DIAGNOSIS — L309 Dermatitis, unspecified: Secondary | ICD-10-CM | POA: Diagnosis not present

## 2022-01-04 ENCOUNTER — Other Ambulatory Visit: Payer: Self-pay | Admitting: Neurosurgery

## 2022-01-04 DIAGNOSIS — M48062 Spinal stenosis, lumbar region with neurogenic claudication: Secondary | ICD-10-CM

## 2022-01-17 ENCOUNTER — Other Ambulatory Visit: Payer: Self-pay

## 2022-01-17 ENCOUNTER — Ambulatory Visit
Admission: RE | Admit: 2022-01-17 | Discharge: 2022-01-17 | Disposition: A | Discharge status: Home / Self Care | Payer: Medicare Other | Source: Ambulatory Visit | Attending: Neurosurgery | Admitting: Neurosurgery

## 2022-01-17 DIAGNOSIS — M48062 Spinal stenosis, lumbar region with neurogenic claudication: Secondary | ICD-10-CM

## 2022-12-04 IMAGING — MR MR LUMBAR SPINE W/O CM
4 of 5 series · 25 of 48 positions shown · non-contrast
Comparison: Prior MRIs available for comparison, correlation is
made with 04/08/2019 CT abdomen pelvis

CLINICAL DATA: Low back pain radiating to bilateral hamstrings

EXAM:
MRI LUMBAR SPINE WITHOUT CONTRAST
TECHNIQUE: Multiplanar, multisequence MR imaging of the lumbar spine was
performed. No intravenous contrast was administered.

[Series 5: T2 · sagittal · 4.0mm · 0.59mm/px · 6 of 17 slices shown (1 of 2)]
[im 1/17]
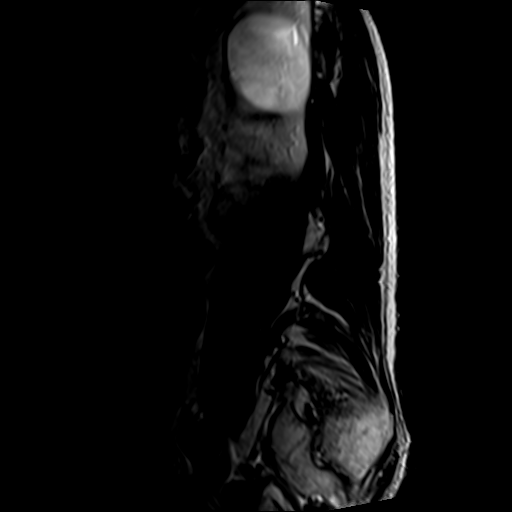
[im 4/17]
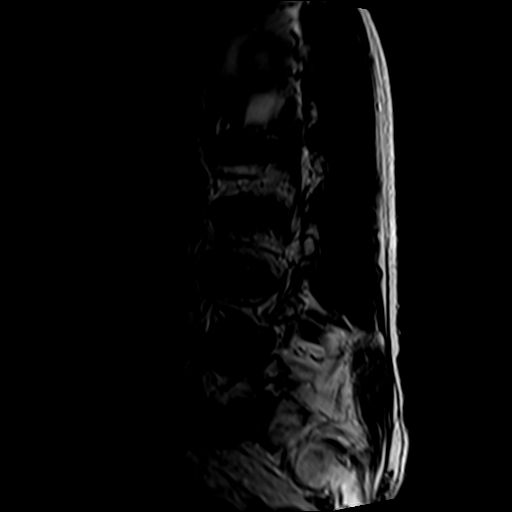
[im 7/17]
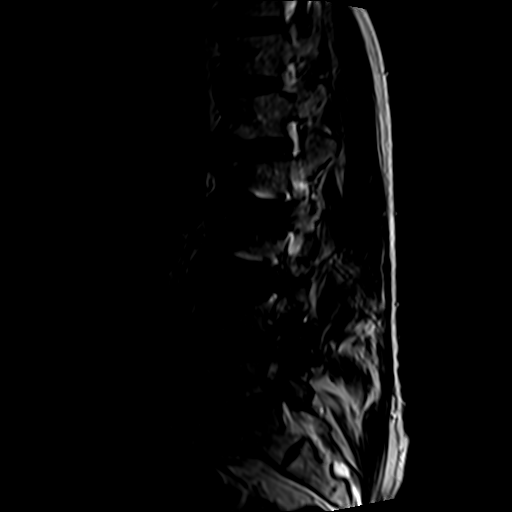
[im 10/17]
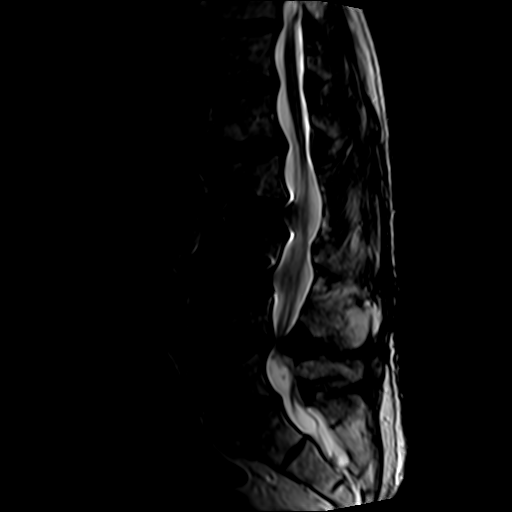
[im 13/17]
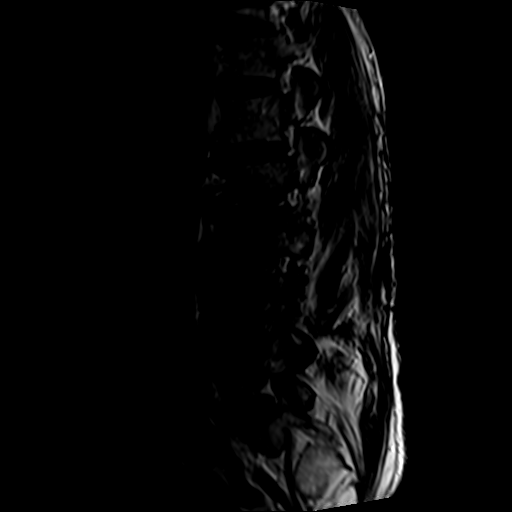
[im 17/17]
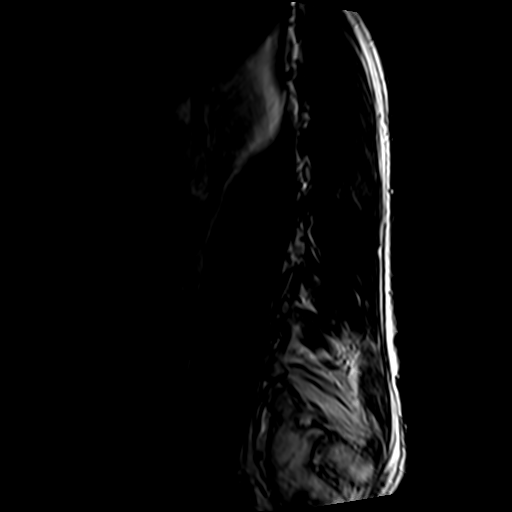

[Series 7: T1 · sagittal · 4.0mm · 0.59mm/px · 5 of 15 slices shown (1 of 2)]
[im 1/15]
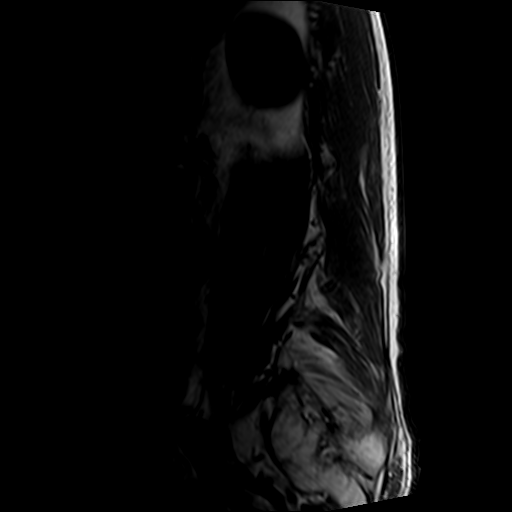
[im 4/15]
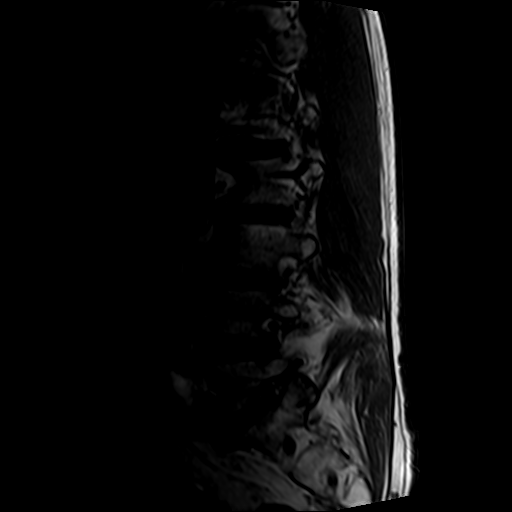
[im 8/15]
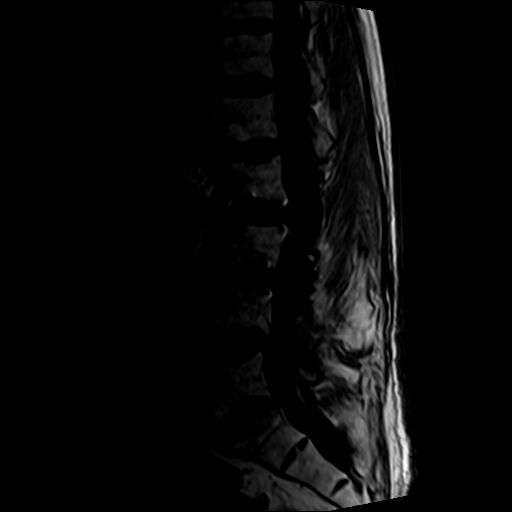
[im 11/15]
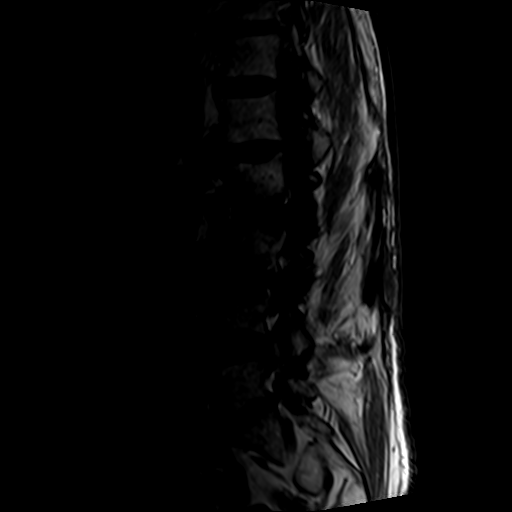
[im 15/15]
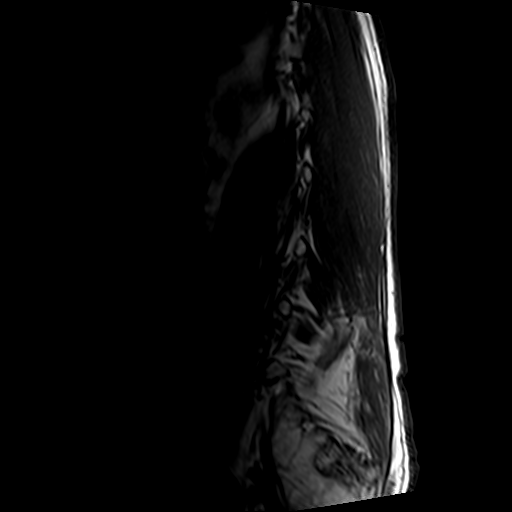

[Series 8: T2 · axial · 4.0mm · 0.70mm/px · z∈[-85,+147]mm · 10 of 45 slices shown (2 of 2)]
[im 3/45]
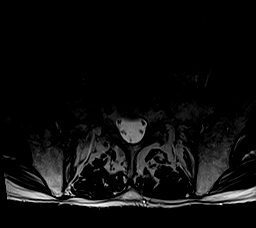
[im 6/45]
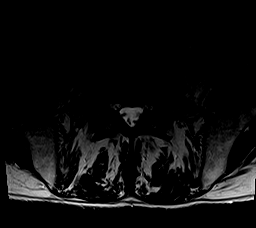
[im 9/45]
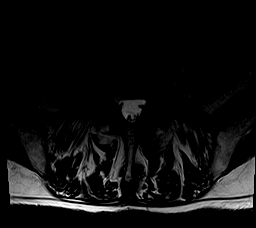
[im 15/45]
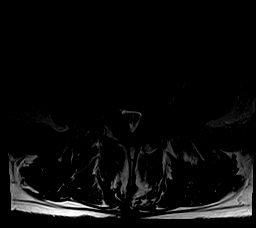
[im 21/45]
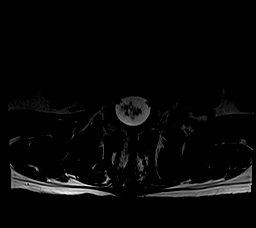
[im 24/45]
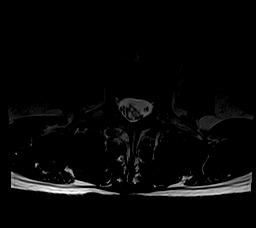
[im 27/45]
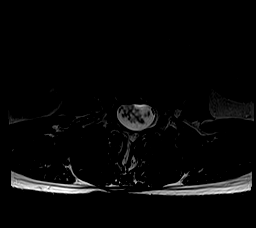
[im 33/45]
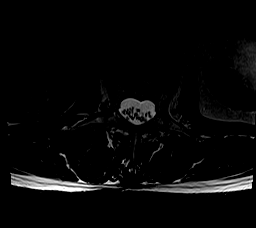
[im 39/45]
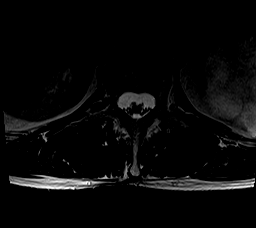
[im 45/45]
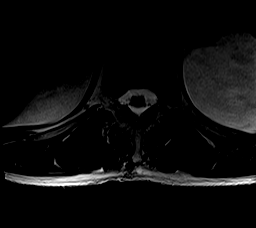

[Series 9: T1 · axial · 4.0mm · 0.35mm/px · z∈[-85,+116]mm · 4 of 45 slices shown (2 of 2)]
[im 3/45]
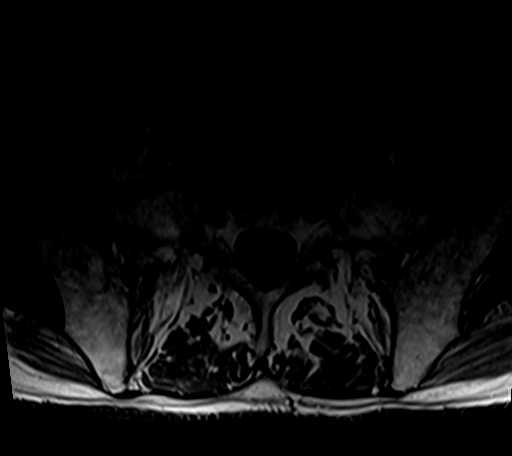
[im 6/45]
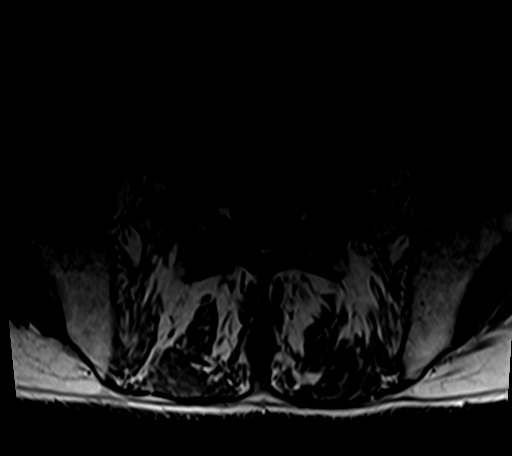
[im 24/45]
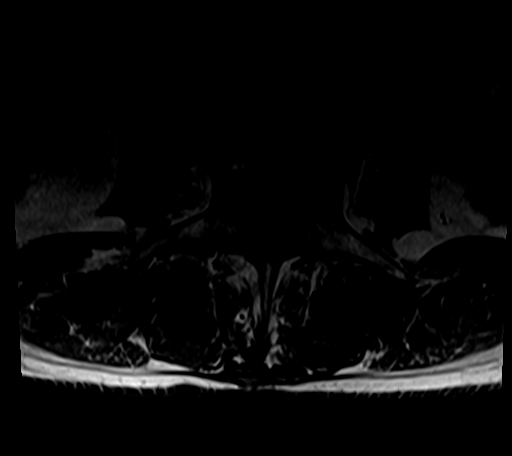
[im 39/45]
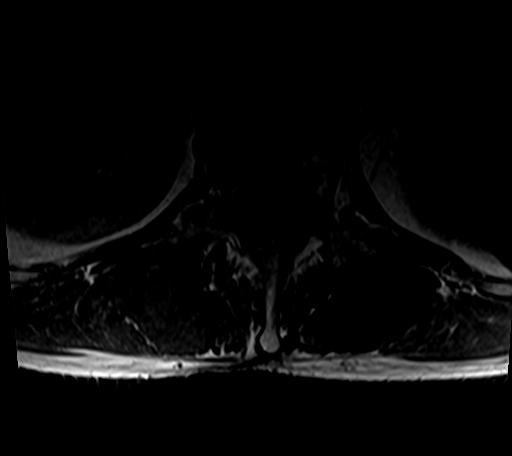

[25 of 48 positions shown; findings below may reference images not displayed]

FINDINGS: Segmentation: Partial sacralization of L5 with left and possibly
right L5-S1 pseudoarticulation of broadened L5 transverse processes
with the sacral ala. Segmentation is otherwise normal.

Alignment: Reversal of the normal lumbar lordosis. Trace
retrolisthesis L4 on L5. Levocurvature of the lumbar spine.

Vertebrae: No acute fracture or suspicious osseous lesion. Status
post interbody disc spacer placement at L2-L3 and L3-L4, with
posterior decompression. Unchanged mild wedging of L2 compared to
5757.

Conus medullaris and cauda equina: Conus extends to the L2 level.
The conus is normal in appearance. Indistinctness of some nerve
roots, which likely reflects motion. No nerve root clumping.

Paraspinal and other soft tissues: Large left renal cysts. Small
right renal cysts. Known aortic aneurysm, status [employer], is
incompletely evaluated.

Disc levels:

T12-L1: Disc height loss with mild disc bulge. No spinal canal
stenosis or neural foraminal narrowing.

L1-L2: Disc height loss with mild to moderate disc bulge with
central annular fissure. Mild facet arthropathy. No spinal canal
stenosis or neural foraminal narrowing.

L2-L3: Status post spacer placement and posterior decompression. No
spinal canal stenosis. No spinal canal stenosis or definite neural
foraminal narrowing.

L3-L4: Status post spacer placement and posterior decompression. No
spinal canal stenosis or definite neural foraminal narrowing.

L4-L5: Moderate disc bulge. Moderate facet arthropathy. Ligamentum
flavum hypertrophy. Moderate to severe spinal canal stenosis.
Effacement of the lateral recesses. Moderate left and mild right
neural foraminal narrowing.

L5-S1: Mild disc bulge. Mild facet arthropathy. Narrowing of the
bilateral lateral recesses. No spinal canal stenosis. Severe left
neural foraminal narrowing.
IMPRESSION: 1. Status post interbody disc spacer placement at L2-L3 and L3-L4,
with posterior decompression. No spinal canal stenosis or
significant neural foraminal narrowing at these levels.
2. Adjacent segment disease at L4-L5, where there is moderate to
severe spinal canal stenosis, with moderate left and mild right
neural foraminal narrowing. Effacement of the lateral recesses at
this level likely compresses the descending L5 nerve roots.
3. L5-S1 severe left neural foraminal narrowing. Narrowing of the
lateral recesses at this level could affect the descending S1 nerve
roots.
4. Partial sacralization of L5 with left and possible right L5-S1
pseudoarticulation of broadened L5 transverse processes with the
sacral ala, which can be a cause of pain. Given transitional
anatomy, correlate with imaging if any intervention is planned.

## 2023-03-01 ENCOUNTER — Ambulatory Visit
Admission: RE | Admit: 2023-03-01 | Discharge: 2023-03-01 | Disposition: A | Discharge status: Home / Self Care | Payer: Self-pay | Source: Ambulatory Visit | Attending: Radiation Oncology | Admitting: Radiation Oncology

## 2023-03-01 ENCOUNTER — Other Ambulatory Visit: Payer: Self-pay | Admitting: Radiation Oncology

## 2023-03-01 DIAGNOSIS — C099 Malignant neoplasm of tonsil, unspecified: Secondary | ICD-10-CM

## 2023-03-01 NOTE — Progress Notes (Signed)
Head and Neck Cancer Location of Tumor / Histology:  Squamous cell carcinoma of right tonsil   Patient presented symptoms of: (from Dr. [name] consult note 02/07/23): "chief complaint of tonsil mass. He has a growth in the back of his throat for over a month. He does not feel pain. He can see the area. It felt like something stuck in his throat. "  CT Neck & Chest w/ Contrast 02/02/2023   Biopsies revealed:  02/07/2023 A. RIGHT TONSIL, BIOPSY : - Squamous cell carcinoma, p16 positive.  Nutrition Status Yes No Comments  Weight changes? '[x]'$  '[]'$  Reports unintentional weight loss  Swallowing concerns? '[]'$  '[x]'$    PEG? '[]'$  '[x]'$     Referrals Yes No Comments  Social Work? '[x]'$  '[]'$    Dentistry? '[x]'$  '[]'$  Saw Dr. Arnetha Courser at [facility] on 03/01/2023  Swallowing therapy? '[x]'$  '[]'$  Scheduled for evaluation at [facility] on 03/28/2023  Nutrition? '[x]'$  '[]'$    Med/Onc? '[]'$  '[x]'$  No referral at this time   Safety Issues Yes No Comments  Prior radiation? '[]'$  '[x]'$    Pacemaker/ICD? '[]'$  '[x]'$    Possible current pregnancy? '[]'$  '[x]'$  N/A  Is the patient on methotrexate? '[]'$  '[x]'$     Tobacco/Marijuana/Snuff/ETOH use: Current smoker  Past/Anticipated interventions by otolaryngology, if any:  04/06/2023 --Dr. Francina Ames  Scheduled for:  TORS (Transoral robotic surgery)  PHARYNGECTOMY ROBOTIC XI  DISSECTION NECK MODIFIED   02/27/2023 --Dr. Francina Ames (office visit) Impression  Right tonsil squamous cell carcinoma , p16+ , with cervical lymph node involvement Treatment options were reviewed. These include:  Initiating treatment with surgery (followed by adjuvant therapy as indicated by pathology results) vs Beginning with chemoradiotherapy (with surgery reserved for salvage).  Pros and cons of these approaches were discussed.  Surgery in this case would be: TORS resection of the primary tumor, right neck dissection.  It is likely that RT will be recommended postoperatively, but at adjuvant doses rather  than treatment doses, and in about 2/3 of cases we can avoid chemotherapy.  He wishes to initiate therapy with surgery.  We will schedule this for the near future Will have him see Rad Onc and dentistry in anticipation of postop RT NavDx drawn today He is agreeable with this plan.  All his questions were answered.   02/07/2023 Dr. Melida Quitter --Procedures:  Biopsy of right tonsil  --Impression & Plans:  Ethan [name] is a 72 y.o. male with tonsil cancer. Symptoms and findings are consistent with tonsil cancer.  Biopsy was performed.  I will call with results.  In reviewing his imaging, he may be a candidate for TORS management that may allow reduced intensity of adjuvant therapy.  I will arrange [facility] to consider that treatment.  Otherwise, standard chemoradiation would be the treatment of choice, assuming that the biopsy confirms the diagnosis.   Past/Anticipated interventions by medical oncology, if any: none at this time  Current Complaints / other details:  Nothing else of note

## 2023-03-02 ENCOUNTER — Other Ambulatory Visit: Payer: Self-pay | Admitting: Radiation Oncology

## 2023-03-02 ENCOUNTER — Ambulatory Visit
Admission: RE | Admit: 2023-03-02 | Discharge: 2023-03-02 | Disposition: A | Discharge status: Home / Self Care | Payer: Self-pay | Source: Ambulatory Visit | Attending: Radiation Oncology | Admitting: Radiation Oncology

## 2023-03-02 DIAGNOSIS — C099 Malignant neoplasm of tonsil, unspecified: Secondary | ICD-10-CM

## 2023-03-05 NOTE — Progress Notes (Signed)
Oncology Nurse Navigator Documentation   Placed introductory call to new referral patient Ethan [name] Introduced myself as the H&N oncology nurse navigator that works with Dr. Isidore Moos to whom he has been referred by Dr. Nicolette Bang. He confirmed understanding of referral. Briefly explained my role as his navigator, provided my contact information.  Confirmed understanding of upcoming appts and [place] location, explained arrival and registration process.   I encouraged him to call with questions/concerns as he moves forward with appts and procedures.   He verbalized understanding of information provided, expressed appreciation for my call.   Navigator Initial Assessment Employment Status: he is retired Currently on [employer] / STD: no Living Situation: he lives in [place] with his wife Support System: wife, family PCP: [facility] PCD: Financial Concerns: no Transportation Needs: no Sensory Deficits: no Engineer, building services Needed:  no Ambulation Needs: no Psychosocial Needs:  no Concerns/Needs Understanding Cancer:  addressed/answered by navigator to best of ability Self-Expressed Needs: no   Clinical biochemist, BSN, OCN Head & Neck Oncology Nurse [place] at [facility] Phone # [phone]  Fax # [phone]

## 2023-03-05 NOTE — Progress Notes (Incomplete)
Radiation Oncology         [phone] ________________________________  Initial Outpatient Consultation  Name: Ethan [name] MRN: [id number]  Date: 03/06/2023  DOB: 03-24-1951  [id number], [facility]  Francina Ames, MD   REFERRING PHYSICIAN: Francina Ames, MD  DIAGNOSIS:    ICD-10-CM   1. Squamous cell carcinoma of right tonsil (HCC)  C09.9      Squamous cell carcinoma of the right tonsil, p16+, with cervical lymph node involvement   CHIEF COMPLAINT: Here to discuss management of tonsillar cancer  HISTORY OF PRESENT ILLNESS::Ethan [name] is a 72 y.o. male who presented to Dr. Redmond Baseman on 02/07/23 for evaluation of an increasing right tonsillar mass. The patient reported first noticing the mass approximately 1 month prior. He denied any associated pain but reported feeling as though something was stuck in his throat. Exam of the oropharynx performed during this visit showed an enlarged and irregular right tonsil, 1+ left tonsil, and a couple of enlarged right zone 2 lymph nodes. There were no masses or tenderness noted to the neck on palpation.   The patient had a CT of the neck performed prior to his evaluation by Dr. Redmond Baseman. Although this study is not currently available on EMR, Dr. Redmond Baseman was able to obtain a copy of the results and notes that it demonstrated a right tonsillar mass with about 3 enlarged zone 2 lymph nodes.   Biopsy of the right tonsil collected by Dr. Redmond Baseman on 02/07/23 revealed: squamous cell carcinoma, p16 positive.   Subsequently, the patient was referred to Dr. Nicolette Bang on 02/27/23 to discuss treatment options. Following a detailed discussion of the risks and benefits with Dr. Nicolette Bang, the patient has opted to proceed with TORS resection of the primary tumor followed by adjuvant therapy. His procedure has been scheduled for 04/06/23. (US of the neck performed during this confirmed at least 3 suspicious lymph nodes.   Dr. Nicolette Bang has  placed a referral to dentistry for radiation clearance (apt scheduled for this coming Thursday, 03/14).    Swallowing issues, if any: feels as though something is stuck in his throat  Weight Changes: none  Pain status: none  Other symptoms: none  Tobacco history, if any: smoker since age 70 (34+ years) averaging 1 ppd   ETOH abuse, if any: drinks on occasion   Prior cancers, if any: none  PREVIOUS RADIATION THERAPY: No  PAST MEDICAL HISTORY:  has a past medical history of AAA (abdominal aortic aneurysm) ([place]), Hyperlipidemia, and Renal disorder.    PAST SURGICAL HISTORY: Past Surgical History:  Procedure Laterality Date   ABDOMINAL AORTIC ANEURYSM REPAIR  06-15-11   EVAR   CYSTOSCOPY WITH RETROGRADE PYELOGRAM, URETEROSCOPY AND STENT PLACEMENT Right 03/11/2015   Procedure: CYSTOSCOPY WITH RETROGRADE PYELOGRAM, AND RIGHT URETERAL STENT PLACEMENT;  Surgeon: Rana Snare, MD;  Location: [facility];  Service: Urology;  Laterality: Right;   HERNIA REPAIR     LOWER BACK FUSION, L2-L3,AND L3-L4     RIGHT LOWER TIB-FEM FRACTURE     SHOULDER SURGERY Bilateral    SPINE SURGERY      FAMILY HISTORY: family history includes Alcohol abuse in his brother; Aneurysm in his father and paternal uncle; Cancer in his brother; Heart disease in his mother; Hypertension in his sister; Stroke in his mother.  SOCIAL HISTORY:  reports that he has been smoking cigarettes. He has been smoking an average of .5 packs per day. He has never used smokeless tobacco. He reports current alcohol use. He  reports that he does not use drugs.  ALLERGIES: Patient has no known allergies.  MEDICATIONS:  Current Outpatient Medications  Medication Sig Dispense Refill   augmented betamethasone dipropionate (DIPROLENE-AF) 0.05 % cream APPLY TO RASH ON THE SKIN TWICE DAILY FOR 2 3 WEEKS.     finasteride (PROSCAR) 5 MG tablet Take 5 mg by mouth daily.     HYDROcodone-acetaminophen (NORCO/VICODIN) 5-325 MG per tablet Take 1  tablet by mouth every 8 (eight) hours as needed (for pain).     meloxicam (MOBIC) 15 MG tablet      predniSONE (DELTASONE) 20 MG tablet Take 1 tablet (20 mg total) by mouth daily with breakfast. (Patient not taking: Reported on 02/10/2019) 5 tablet 0   tamsulosin (FLOMAX) 0.4 MG CAPS capsule      No current facility-administered medications for this encounter.    REVIEW OF SYSTEMS:  Notable for that above.   PHYSICAL EXAM:  vitals were not taken for this visit.   General: Alert and oriented, in no acute distress HEENT: Head is normocephalic. Extraocular movements are intact. Oropharynx is notable for ***. Neck: Neck is notable for *** Heart: Regular in rate and rhythm with no murmurs, rubs, or gallops. Chest: Clear to auscultation bilaterally, with no rhonchi, wheezes, or rales. Abdomen: Soft, nontender, nondistended, with no rigidity or guarding. Extremities: No cyanosis or edema. Lymphatics: see Neck Exam Skin: No concerning lesions. Musculoskeletal: symmetric strength and muscle tone throughout. Neurologic: Cranial nerves II through XII are grossly intact. No obvious focalities. Speech is fluent. Coordination is intact. Psychiatric: Judgment and insight are intact. Affect is appropriate.   ECOG = ***  0 - Asymptomatic (Fully active, able to carry on all predisease activities without restriction)  1 - Symptomatic but completely ambulatory (Restricted in physically strenuous activity but ambulatory and able to carry out work of a light or sedentary nature. For example, light housework, office work)  2 - Symptomatic, <50% in bed during the day (Ambulatory and capable of all self care but unable to carry out any work activities. Up and about more than 50% of waking hours)  3 - Symptomatic, >50% in bed, but not bedbound (Capable of only limited self-care, confined to bed or chair 50% or more of waking hours)  4 - Bedbound (Completely disabled. Cannot carry on any self-care. Totally  confined to bed or chair)  5 - Death   Eustace Pen MM, Creech RH, Tormey DC, et al. [phone]). "Toxicity and response criteria of the [facility] Group". Portal Oncol. 5 (6): 649-55   LABORATORY DATA:  Lab Results  Component Value Date   WBC 7.3 03/12/2015   HGB 14.0 03/12/2015   HCT 41.6 03/12/2015   MCV 96.3 03/12/2015   PLT 144 (L) 03/12/2015   CMP     Component Value Date/Time   NA 137 03/12/2015 0505   K 3.9 03/12/2015 0505   CL 104 03/12/2015 0505   CO2 24 03/12/2015 0505   GLUCOSE 115 (H) 03/12/2015 0505   BUN 19 03/12/2015 0505   CREATININE 1.65 (H) 03/12/2015 0505   CALCIUM 8.2 (L) 03/12/2015 0505   PROT 6.6 06/15/2011 0759   ALBUMIN 3.7 06/15/2011 0759   AST 19 06/15/2011 0759   ALT 10 06/15/2011 0759   ALKPHOS 72 06/15/2011 0759   BILITOT 0.3 06/15/2011 0759   GFRNONAA 42 (L) 03/12/2015 0505   GFRAA 49 (L) 03/12/2015 0505      No results found for: "TSH"   RADIOGRAPHY: No results  found.    IMPRESSION/PLAN:  This is a delightful patient with head and neck cancer. I *** recommend radiotherapy for this patient.  We discussed the potential risks, benefits, and side effects of radiotherapy. We talked in detail about acute and late effects. We discussed that some of the most bothersome acute effects may be mucositis, dysgeusia, salivary changes, skin irritation, hair loss, dehydration, weight loss and fatigue. We talked about late effects which include but are not necessarily limited to dysphagia, hypothyroidism, nerve injury, vascular injury, spinal cord injury, xerostomia, trismus, neck edema, and potential injury to any of the tissues in the head and neck region. No guarantees of treatment were given. A consent form was signed and placed in the patient's medical record. The patient is enthusiastic about proceeding with treatment. I look forward to participating in the patient's care.    Simulation (treatment planning) will take place ***  We also  discussed that the treatment of head and neck cancer is a multidisciplinary process to maximize treatment outcomes and quality of life. For this reason the following referrals have been or will be made:  *** Medical oncology to discuss chemotherapy   *** Dentistry for dental evaluation, possible extractions in the radiation fields, and /or advice on reducing risk of cavities, osteoradionecrosis, or other oral issues.  *** Nutritionist for nutrition support during and after treatment.  *** Speech language pathology for swallowing and/or speech therapy.  *** Social work for social support.   *** Physical therapy due to risk of lymphedema in neck and deconditioning.  *** Baseline labs including TSH.  On date of service, in total, I spent *** minutes on this encounter. Patient was seen in person.  __________________________________________   Eppie Gibson, MD  This document serves as a record of services personally performed by Eppie Gibson, MD. It was created on her behalf by Roney Mans, a trained medical scribe. The creation of this record is based on the scribe's personal observations and the provider's statements to them. This document has been checked and approved by the attending provider.

## 2023-03-06 ENCOUNTER — Telehealth: Payer: Self-pay | Admitting: Hematology and Oncology

## 2023-03-06 ENCOUNTER — Ambulatory Visit
Admission: RE | Admit: 2023-03-06 | Discharge: 2023-03-06 | Disposition: A | Discharge status: Home / Self Care | Payer: Medicare Other | Source: Ambulatory Visit | Attending: Radiation Oncology | Admitting: Radiation Oncology

## 2023-03-06 ENCOUNTER — Ambulatory Visit
Admission: RE | Admit: 2023-03-06 | Discharge: 2023-03-06 | Disposition: A | Discharge status: Home / Self Care | Payer: Medicare Other | Source: Ambulatory Visit | Attending: Hematology and Oncology | Admitting: Hematology and Oncology

## 2023-03-06 ENCOUNTER — Other Ambulatory Visit: Payer: Self-pay

## 2023-03-06 ENCOUNTER — Encounter: Payer: Self-pay | Admitting: Radiation Oncology

## 2023-03-06 VITALS — BP 131/81 | HR 76 | Temp 97.7°F | Resp 20 | Ht 75.0 in | Wt 196.6 lb

## 2023-03-06 DIAGNOSIS — R1310 Dysphagia, unspecified: Secondary | ICD-10-CM | POA: Diagnosis not present

## 2023-03-06 DIAGNOSIS — C090 Malignant neoplasm of tonsillar fossa: Secondary | ICD-10-CM

## 2023-03-06 DIAGNOSIS — C099 Malignant neoplasm of tonsil, unspecified: Secondary | ICD-10-CM

## 2023-03-06 DIAGNOSIS — F17210 Nicotine dependence, cigarettes, uncomplicated: Secondary | ICD-10-CM | POA: Diagnosis not present

## 2023-03-06 LAB — CBC WITH DIFFERENTIAL (CANCER CENTER ONLY)
Abs Immature Granulocytes: 0.04 10*3/uL (ref 0.00–0.07)
Basophils Absolute: 0.1 10*3/uL (ref 0.0–0.1)
Basophils Relative: 1 %
Eosinophils Absolute: 0.1 10*3/uL (ref 0.0–0.5)
Eosinophils Relative: 2 %
HCT: 48.7 % (ref 39.0–52.0)
Hemoglobin: 17.1 g/dL — ABNORMAL HIGH (ref 13.0–17.0)
Immature Granulocytes: 1 %
Lymphocytes Relative: 20 %
Lymphs Abs: 1.6 10*3/uL (ref 0.7–4.0)
MCH: 34.2 pg — ABNORMAL HIGH (ref 26.0–34.0)
MCHC: 35.1 g/dL (ref 30.0–36.0)
MCV: 97.4 fL (ref 80.0–100.0)
Monocytes Absolute: 0.6 10*3/uL (ref 0.1–1.0)
Monocytes Relative: 7 %
Neutro Abs: 5.9 10*3/uL (ref 1.7–7.7)
Neutrophils Relative %: 69 %
Platelet Count: 198 10*3/uL (ref 150–400)
RBC: 5 MIL/uL (ref 4.22–5.81)
RDW: 12.9 % (ref 11.5–15.5)
WBC Count: 8.4 10*3/uL (ref 4.0–10.5)
nRBC: 0 % (ref 0.0–0.2)

## 2023-03-06 LAB — CMP (CANCER CENTER ONLY)
ALT: 8 U/L (ref 0–44)
AST: 13 U/L — ABNORMAL LOW (ref 15–41)
Albumin: 4.3 g/dL (ref 3.5–5.0)
Alkaline Phosphatase: 63 U/L (ref 38–126)
Anion gap: 5 (ref 5–15)
BUN: 19 mg/dL (ref 8–23)
CO2: 29 mmol/L (ref 22–32)
Calcium: 9.3 mg/dL (ref 8.9–10.3)
Chloride: 107 mmol/L (ref 98–111)
Creatinine: 1.07 mg/dL (ref 0.61–1.24)
GFR, Estimated: >60 mL/min (ref >60)
Glucose, Bld: 93 mg/dL (ref 70–99)
Potassium: 4.2 mmol/L (ref 3.5–5.1)
Sodium: 141 mmol/L (ref 135–145)
Total Bilirubin: 0.5 mg/dL (ref 0.3–1.2)
Total Protein: 7.2 g/dL (ref 6.5–8.1)

## 2023-03-06 LAB — TSH: TSH: 0.853 u[IU]/mL (ref 0.350–4.500)

## 2023-03-06 MED ORDER — BUPROPION HCL ER (SR) 150 MG PO TB12: ORAL_TABLET | ORAL | 2 refills | Status: DC

## 2023-03-06 NOTE — Telephone Encounter (Signed)
scheduled per 3/12 referral, pt has been called and confirmed date and time. Pt is aware of location and to arrive early for check in

## 2023-03-06 NOTE — Progress Notes (Signed)
Oncology Nurse Navigator Documentation   Met with patient during initial consult with Dr. Isidore Moos.  He was accompanied by his wife and daughter.  Further introduced myself as his/their Navigator, explained my role as a member of the Care Team. Provided New Patient resource guide binder: Contact information for physicians, this navigator, other members of the Care Team Advance Directive information; provided [facility] AD booklet at their request,  Fall Prevention Patient [employer] Information sheet Symptom Management Clinic information [place] campus map with highlight of [place] [facility] Information sheet Head and Neck cancer basics Nutrition information Patient and family support information including Spiritual care/Chaplain information, Peer mentor program, health and wellness classes, and the survivorship program Community resources  Provided and discussed educational handouts for PEG and PAC. Assisted with post-consult appt scheduling. Referral placed to medical oncology and PET scan will be scheduled once authorized by insurance.  Dr. Isidore Moos will contact Dr. Peyton Najjar at [facility] to confer regarding possible dental work needed before radiation would begin.  They verbalized understanding of information provided. I encouraged them to call with questions/concerns moving forward.  Harlow Asa, RN, BSN, OCN Head & Neck Oncology Nurse [place] at [place] [phone]

## 2023-03-07 DIAGNOSIS — C090 Malignant neoplasm of tonsillar fossa: Secondary | ICD-10-CM | POA: Insufficient documentation

## 2023-03-07 NOTE — Progress Notes (Signed)
Oncology Nurse Navigator Documentation   At Dr. Pearlie Oyster request I have sent the following fax to Dr. Daneen Schick DDS: Hi, Dr. Peyton Najjar and / or Dr. Kalman Shan, I met Otis Brace, DOB 1951/07/18.  In my assessment, the dose to his tooth roots will be easily kept under 50.0Gy, pending his PET scan for better tumor delineation. If this assessment changes (which is unlikely), I'll let you know. He is leaning towards Chemo-RT over TORS and adjuvant RT, but has yet to see medical oncology. Please let me know if you need more info to determine if extractions are needed.  Thanks! Eppie Gibson MD Rad/onc; [facility] [phone] (cell) - best to text me with your name and I'll call you back   **Notice of successful fax transmission received.   Harlow Asa RN, BSN, OCN Head & Neck Oncology Nurse [place] at [facility] Phone # [phone]  Fax # [phone]

## 2023-03-12 ENCOUNTER — Ambulatory Visit
Admission: RE | Admit: 2023-03-12 | Discharge: 2023-03-12 | Disposition: A | Discharge status: Home / Self Care | Payer: Medicare Other | Source: Ambulatory Visit | Attending: Radiation Oncology | Admitting: Radiation Oncology

## 2023-03-12 DIAGNOSIS — I700 Atherosclerosis of aorta: Secondary | ICD-10-CM | POA: Diagnosis present

## 2023-03-12 DIAGNOSIS — C090 Malignant neoplasm of tonsillar fossa: Secondary | ICD-10-CM | POA: Diagnosis not present

## 2023-03-12 LAB — GLUCOSE, CAPILLARY: Glucose-Capillary: 93 mg/dL (ref 70–99)

## 2023-03-12 MED ORDER — FLUDEOXYGLUCOSE F - 18 (FDG) INJECTION
10.5700 | Freq: Once | INTRAVENOUS | Status: AC | PRN
  Administered 2023-03-12: 10.57 via INTRAVENOUS

## 2023-03-14 NOTE — Progress Notes (Signed)
Oncology Nurse Navigator Documentation   I spoke with Mr. Wendland today after ENT conference. Mr. Pietrangelo has decided to proceed with TORS surgery at [facility] with Dr. Nicolette Bang. He understands that he will most likely need radiation after surgery and possibly chemotherapy as well. I will follow his progress and schedule him for appointment here at [facility] as needed. Mr. Fudala voiced his appreciation for all the information received and knows to call me if he has any further questions or concerns. I will cancel his scheduled medical oncology appointment on 3/25 with Dr. Chryl Heck.  Harlow Asa RN, BSN, OCN Head & Neck Oncology Nurse [place] at [facility] Phone # [phone]  Fax # [phone]

## 2023-03-14 NOTE — Progress Notes (Signed)
Oncology Nurse Navigator Documentation   Dr. Isidore Moos would like to speak to Dr. Peyton Najjar DDS regarding Mr. Milillo possible dental procedure before beginning chemo/radiation treatment. I called Dr. Deniece Ree office and left a VM wit Dr. Isidore Moos cell phone number and my office number asking for a call back as soon as possible.   Harlow Asa RN, BSN, OCN Head & Neck Oncology Nurse [place] at [facility] Phone # [phone]  Fax # [phone]

## 2023-03-19 ENCOUNTER — Other Ambulatory Visit: Payer: 59

## 2023-03-19 ENCOUNTER — Ambulatory Visit: Payer: 59 | Admitting: Hematology and Oncology

## 2023-04-06 HISTORY — PX: THROAT SURGERY: SHX803

## 2023-04-18 ENCOUNTER — Telehealth: Payer: Self-pay | Admitting: Radiation Oncology

## 2023-04-18 NOTE — Telephone Encounter (Signed)
4/24 @ 2:40 pm Heather from Dr. Greggory Stallion office called while patient was in their office to get him schedule with Dr. Basilio Cairo.  Patient wanted to see Dr. Hezzie Bump prior to his f/u with Dr. Basilio Cairo.  Patient requested the week of 5/13. Marland Kitchen

## 2023-04-20 ENCOUNTER — Other Ambulatory Visit: Payer: Self-pay

## 2023-04-20 DIAGNOSIS — C090 Malignant neoplasm of tonsillar fossa: Secondary | ICD-10-CM

## 2023-04-20 NOTE — Progress Notes (Signed)
Head and Neck Cancer Location of Tumor / Histology: right tonsil and right neck lymph nodes  NM PET Image Initial (PI) Skull Base To Thigh 03-12-23 IMPRESSION: 1. The right tonsillar mass is tracer avid and is concerning for malignancy. 2. Hypermetabolic right level 2 and level 3 cervical lymph nodes are identified compatible with metastatic adenopathy. 3. The right upper lobe elongated area of increased soft tissue thickening within the medial right upper lobe is tracer avid within SUV max of 5.08. This is indeterminate and may be postinflammatory or infectious in etiology. Underlying tumor with endobronchial spread is not excluded. Consider more definitive characterization with bronchoscopy. 4. Mild tracer avid left hilar lymph nodes are nonspecific and may reflect reactive change. 5. (Aortic Atherosclerosis (ICD10-I70.0).  Patient presented symptoms of: (Per ENT note) About 3 months ago he noted some throat symptoms. Felt like a piece food stuck at the right tonsil. Worsened slowly over this time. Saw an enlarged right tonsil   Biopsies revealed:  04-06-23   Final Diagnosis   A. TONGUE BASE MARGIN, BIOPSY:               Squamous mucosa present.               Negative for high-grade dysplasia or carcinoma.    B. ANTERIOR MARGIN, BIOPSY:  Negative for  high-grade dysplasia or carcinoma.   C. POSTERIOR MARGIN, BIOPSY:  Negative for high-grade dysplasia or carcinoma.   D. SOFT PALATE MARGIN, BIOPSY:               Negative for high-grade dysplasia or carcinoma.    E. DEEP MARGIN, BIOPSY:               Negative for carcinoma.    F. SUBMANDIBULAR CONTENTS, EXCISION:               Salivary gland negative for neoplasm.    G. RIGHT, TONSIL, RADICAL TONSILLECTOMY:  Nonkeratinizing squamous cell carcinoma with focal maturation, HPV- associated .                           Tumor size: 2.7 cm in greatest dimension                            Lymphovascular invasion: identified.                             Perineural invasion: Identified. Surgical margins: Negative; the tumor is present at the deep edge of this specimen and additional margins are negative.                             Pathologic stage: pT2pN2                           See synoptic reports.   H. RIGHT NECK CONTENTS LEVEL 2A, 3 & 4, DISSECTION:               Eight of twelve lymph nodes positive for carcinoma (8/12). Largest tumor deposit: 2.6 cm in greatest dimension. Extranodal extension: Present.    Nutrition Status Yes No Comments  Weight changes? [x]  []  Lost 15lbs after surgery, gained 8lbs back  Swallowing concerns? [x]  []  Hurts to swallow after surgery, eating most foods now, modifying what he eats  PEG? []  [x]     Referrals Yes No Comments  Social Work? []  [x]    Dentistry? [x]  []  04-02-23, saw Dr. Vanessa Barbara, coiled two teeth, getting flouride trays  Swallowing therapy? [x]  []  Had MBS   Nutrition? [x]  []    Med/Onc? [x]  []  Has med onc appt   Safety Issues Yes No Comments  Prior radiation? []  [x]    Pacemaker/ICD? []  [x]    Possible current pregnancy? []  [x]  na  Is the patient on methotrexate? []  [x]     Tobacco/Marijuana/Snuff/ETOH use: yes, current smoker for over 50 years  Past/Anticipated interventions by otolaryngology, if any:  Dr. Hezzie Bump 04-06-23 Procedures  SURG TECHNIQUES REQ USE OF ROBOTIC SURG SYS  PR REMOVAL OF TONSILS,12+ Y/O  PR REMOVAL NODES, NECK,CERV MOD RAD  TORS (Transoral robotic surgery)  DISSECTION NECK MODIFIED   Dr. Bobette Mo consult note on 02-27-23 History of present illness: Mr. Antigua is a 72 y.o. presenting for evaluation of tonsil cancer. He has been referred by Christia Reading MD  About 3 months ago he noted some throat symptoms. Felt like a piece food stuck at the right tonsil. Worsened slowly over this time. Saw an enlarged right tonsil  Denies frank pain but uncomfortable. Some mild right otalgia. No sore throat. Some hoarsenss. No bleeding  Feels an  enlarged lymph node in right upper neck  He saw Dr Jenne Pane who noted an irregular right tonsil. Biopsy performed, showing SCCa p16+  CT done (report not available to me today)  PMH - denies Tobacco - 50+ yrs, about 1 ppd.  Impression Right tonsil squamous cell carcinoma , p16+ , with cervical lymph node involvement  Treatment options were reviewed. These include initiating treatment with surgery (followed by adjuvant therapy as indicated by pathology results) vs beginning with chemoradiotherapy (with surgery reserved for salvage). Pros and cons of these approaches were discussed.  Surgery in this case would be: TORS resection of the primary tumor, right neck dissection.  It is likely that RT will be recommended postoperatively, but at adjuvant doses rather than treatment doses, and in about 2/3 of cases we can avoid chemotherapy.  Risks of this procedure have been discussed. These risks include but are not limited to, risks of anesthesia such as heart attack, stroke, death; risks of any surgery such as bleeding, infection, blood clots, pneumonia; risks of this particular surgery including risk of the TORS such as pain, difficulty swallowing with need for feeding tube which may be prolonged, airway swelling with need for prolonged intubation or tracheotomy placement, changes in speech, injury to the teeth, gums, lips, or tongue, inability to remove the entire tumor; risks of the neck dissection such as numbness, poor cosmetic appearance, weakness of nerves to the shoulder, lip, tongue, or larynx, chyle leak; risks of any tumor surgery, such as recurrence, need for further procedures or other treatments.  Despite these risks, he wishes to initiate therapy with surgery. We will schedule this for the near future  Will have him see Rad Onc and dentistry in anticipation of postop RT  NavDx drawn today  Past/Anticipated interventions by medical oncology, if any: Pt to see Dr. Al Pimple on  04-27-23  Current Complaints / other details:  Biggest concern is will the treatment fully treat. Wants to know the plan will be after treatment. Overall he is nervous about treatment.

## 2023-04-26 ENCOUNTER — Telehealth: Payer: Self-pay

## 2023-04-26 ENCOUNTER — Encounter: Payer: Self-pay | Admitting: Radiation Oncology

## 2023-04-26 NOTE — Telephone Encounter (Signed)
RN called pt to obtain meaningful use and nurse evaluation information. Consult note completed and routed to Dr. Basilio Cairo.

## 2023-04-26 NOTE — Progress Notes (Signed)
Radiation Oncology         [phone] ________________________________  Outpatient Re-Consultation  Name: Ethan [name] MRN: [id number]  Date: 04/27/2023  DOB: [birth year]  WU:[id number], [facility]  Corey Skains, MD   REFERRING PHYSICIAN: Corey Skains, MD  DIAGNOSIS:    ICD-10-CM   1. 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       Squamous cell carcinoma of the right tonsil, p16+, with cervical lymph node involvement: s/p tonsillectomy and right neck dissection showing nonkeratinizing squamous cell carcinoma with focal maturation, HPV associated; positive for LVI and PNI; 8/12 lymph nodes positive    Cancer Staging  Cancer of tonsillar fossa (HCC) Staging form: Pharynx - HPV-Mediated Oropharynx, AJCC 8th Edition - Clinical stage from 03/06/2023: Stage I (cT1, cN1, cM0, p16+) - Unsigned - Pathologic stage from 04/27/2023: Stage II (pT2, pN2, cM0, p16+) - Signed by Lonie Peak, MD on 04/27/2023  CHIEF COMPLAINT: Here to discuss management of tonsillar cancer  Narrative/Interval History 04/27/2023 ::Ethan [name] is a 72 y.o. male who returns today to further discuss the role of radiation of therapy as a part of management for his diagnosed tonsillar cancer. To review from his initial consultation visit, I contacted Dr. Hezzie Bump who conveyed that the tumor should respond to either surgery with radiation therapy or concurrent chemoradiation. At the time, the patient expressed interest in proceeding with concurrent chemoradiation to prevent the possibility of having to receive both radiation and chemotherapy following his resection. In light of his preference, Dr. Hezzie Bump and I recommended proceeding with a referral to medical oncology and a PET scan for treatment planning. Upon further reflection after our visit, the patient decided to proceed with upfront surgery followed by adjuvant chemoradiation.   Subsequent PET scan on 03/12/23 showed: the right  tonsillar mass as tracer avid and concerning for malignancy; hypermetabolic right level 2 and level 3 cervical lymph nodes compatible with metastatic adenopathy; indeterminate tracer avid right upper lobe elongated area of increased soft tissue thickening within the medial right upper lobe (SUV max of 5.08); and nonspecific mild tracer avid left hilar lymph nodes.   The patient ultimately proceeded with a right tonsillectomy (TORS resection) and right neck dissection on 04/06/23 under the care of Dr. Hezzie Bump. Pathology from the procedure (right tonsillectomy) revealed: tumor the size of 2.7 cm; histology of nonkeratinizing squamous cell carcinoma with focal maturation, HPV associated; positive for LVI and PNI; all final margins negative for carcinoma (including tongue base margin). Nodal status of 08/12 right level 2A, 3, and 4 lymph nodes positive for carcinoma; extranodal extension present; largest tumor deposit of 2.6 cm. Submandibular contents were also excised and showed no evidence of malignancy. He also underwent pharyngoplasty repair of the soft palate defect at the time of his procedure.  He has been seen by dentistry and underwent extractions of teeth #18 and #19 on 03/20/23 under Dr. Azucena Cecil. He is healing well from this.   The patient will also be meeting with Dr. Al Pimple today to discuss the role of systemic treatment.  Nutrition Status Yes No Comments  Weight changes? [x]  []  Lost 15lbs after surgery, gained 8lbs back  Swallowing concerns? [x]  []  Hurts to swallow after surgery, eating most foods now, modifying what he eats.  He reports that sometimes when he swallows liquids come through his nose.  PEG? []  [x]     Referrals Yes No Comments  Social Work? []  [x]    Dentistry? [x]  []  04-02-23, saw Dr. Vanessa Barbara, coiled two teeth,  getting flouride trays  Swallowing therapy? [x]  []  Had MBS   Nutrition? [x]  []    Med/Onc? [x]  []  Has med onc appt   Safety Issues Yes No Comments  Prior radiation?  []  [x]    Pacemaker/ICD? []  [x]    Possible current pregnancy? []  [x]  na  Is the patient on methotrexate? []  [x]     Tobacco/Marijuana/Snuff/ETOH use: yes, current smoker for over 50 years   HPI Initial Consultation - 03/06/23 :Ethan [name] is a 72 y.o. male who presented to Dr. Jenne Pane on 02/07/23 for evaluation of an increasing right tonsillar mass. The patient reported first noticing the mass approximately 1 month prior. He denied any associated pain but reported feeling as though something was stuck in his throat. Exam of the oropharynx performed during this visit showed an enlarged and irregular right tonsil, 1+ left tonsil, and a couple of enlarged right zone 2 lymph nodes. There were no masses or tenderness noted to the neck on palpation.    The patient had a CT of the neck performed prior to his evaluation by Dr. Jenne Pane. Although this study is not currently available on EMR, Dr. Jenne Pane was able to obtain a copy of the results and notes that it demonstrated a right tonsillar mass with about 3 enlarged zone 2 lymph nodes.    Biopsy of the right tonsil collected by Dr. Jenne Pane on 02/07/23 revealed: squamous cell carcinoma, p16 positive.    Subsequently, the patient was referred to Dr. Hezzie Bump on 02/27/23 to discuss treatment options. Following a detailed discussion of the risks and benefits with Dr. Hezzie Bump, the patient has opted to proceed with TORS resection of the primary tumor followed by adjuvant therapy. His procedure has been scheduled for 04/06/23. (US of the neck performed during this confirmed at least 3 suspicious lymph nodes.  However, today the patient is unsure whether he wants to proceed with surgery.  He expresses desire to learn more about nonoperative options such as chemoradiation   Dr. Hezzie Bump has placed a referral to dentistry for radiation clearance (apt scheduled for this coming Thursday, 03/14).     Swallowing issues, if any: feels as though something is stuck in his  throat. Experiences some difficulty with swallowing certain foods.    Weight Changes: none    Wt Readings from Last 3 Encounters:  03/06/23 196 lb 9.6 oz (89.2 kg)  02/10/19 200 lb (90.7 kg)  08/29/18 192 lb (87.1 kg)    Pain status: none   Other symptoms: none   Tobacco history, if any: smoker since age 44 (50+ years) averaging 3/4 ppd.  Still smoking over half a pack a day   ETOH abuse, if any: drinks on occasion    Prior cancers, if any: none   He is here with his supportive wife and daughter   PREVIOUS RADIATION THERAPY: No PAST MEDICAL HISTORY:  has a past medical history of AAA (abdominal aortic aneurysm) (HCC), Hyperlipidemia, Kidney stones, and Renal disorder.    PAST SURGICAL HISTORY: Past Surgical History:  Procedure Laterality Date   ABDOMINAL AORTIC ANEURYSM REPAIR  06/15/2011   EVAR   CYSTOSCOPY WITH RETROGRADE PYELOGRAM, URETEROSCOPY AND STENT PLACEMENT Right 03/11/2015   Procedure: CYSTOSCOPY WITH RETROGRADE PYELOGRAM, AND RIGHT URETERAL STENT PLACEMENT;  Surgeon: Barron Alvine, MD;  Location: [facility];  Service: Urology;  Laterality: Right;   HERNIA REPAIR     LOWER BACK FUSION, L2-L3,AND L3-L4     RIGHT LOWER TIB-FEM FRACTURE     SHOULDER SURGERY Bilateral  SPINE SURGERY     THROAT SURGERY  04/06/2023    FAMILY HISTORY: family history includes Alcohol abuse in his brother; Aneurysm in his father and paternal uncle; Cancer in his brother; Heart disease in his mother; Hypertension in his sister; Stroke in his mother.  SOCIAL HISTORY:  reports that he has been smoking cigarettes. He has been smoking an average of .5 packs per day. He has never used smokeless tobacco. He reports current alcohol use. He reports that he does not use drugs.  ALLERGIES: Patient has no known allergies.  MEDICATIONS:  Current Outpatient Medications  Medication Sig Dispense Refill   augmented betamethasone dipropionate (DIPROLENE-AF) 0.05 % cream APPLY TO RASH ON THE SKIN TWICE  DAILY FOR 2 3 WEEKS.     cyanocobalamin (VITAMIN B12) 1000 MCG/ML injection Inject 1,000 mcg into the muscle every 30 (thirty) days.     tamsulosin (FLOMAX) 0.4 MG CAPS capsule      buPROPion (WELLBUTRIN SR) 150 MG 12 hr tablet Start one week before quit date. Take 1 tab daily x 3 days, then 1 tab BID thereafter. 60 tablet 2   No current facility-administered medications for this encounter.    REVIEW OF SYSTEMS:  Notable for that above.   PHYSICAL EXAM:  weight is 190 lb 4 oz (86.3 kg). His oral temperature is 98 F (36.7 C). His blood pressure is 144/79 (abnormal) and his pulse is 80. His respiration is 18 and oxygen saturation is 98%.   General: Alert and oriented, in no acute distress HEENT: Head is normocephalic. Extraocular movements are intact. Right tonsillar fossa notable for healing mucosa.  There is a partial deficit in the right soft palate.  No thrush Neck: Neck is notable for satisfactory healing of right neck dissection scar.  No palpable adenopathy. Skin: No concerning lesions.  No sign of skin recurrence over the neck Musculoskeletal: symmetric strength and muscle tone throughout. Neurologic: Cranial nerves II through XII are grossly intact. No obvious focalities. Speech is fluent. Coordination is intact. Psychiatric: Judgment and insight are intact. Affect is appropriate.   ECOG = 1  0 - Asymptomatic (Fully active, able to carry on all predisease activities without restriction)  1 - Symptomatic but completely ambulatory (Restricted in physically strenuous activity but ambulatory and able to carry out work of a light or sedentary nature. For example, light housework, office work)  2 - Symptomatic, <50% in bed during the day (Ambulatory and capable of all self care but unable to carry out any work activities. Up and about more than 50% of waking hours)  3 - Symptomatic, >50% in bed, but not bedbound (Capable of only limited self-care, confined to bed or chair 50% or more  of waking hours)  4 - Bedbound (Completely disabled. Cannot carry on any self-care. Totally confined to bed or chair)  5 - Death   Santiago Glad MM, Creech RH, Tormey DC, et al. [id number]). "Toxicity and response criteria of the [facility]". Am. Evlyn Clines. Oncol. 5 (6): 649-55   LABORATORY DATA:  Lab Results  Component Value Date   WBC 8.4 03/06/2023   HGB 17.1 (H) 03/06/2023   HCT 48.7 03/06/2023   MCV 97.4 03/06/2023   PLT 198 03/06/2023   CMP     Component Value Date/Time   NA 141 03/06/2023 1255   K 4.2 03/06/2023 1255   CL 107 03/06/2023 1255   CO2 29 03/06/2023 1255   GLUCOSE 93 03/06/2023 1255   BUN 19 03/06/2023 1255  CREATININE 1.07 03/06/2023 1255   CALCIUM 9.3 03/06/2023 1255   PROT 7.2 03/06/2023 1255   ALBUMIN 4.3 03/06/2023 1255   AST 13 (L) 03/06/2023 1255   ALT 8 03/06/2023 1255   ALKPHOS 63 03/06/2023 1255   BILITOT 0.5 03/06/2023 1255   GFRNONAA >60 03/06/2023 1255   GFRAA 49 (L) 03/12/2015 0505      Lab Results  Component Value Date   TSH 0.853 03/06/2023     RADIOGRAPHY: No results found.    IMPRESSION/PLAN:  This is a delightful patient with head and neck (tonsil) cancer.  He returns to me after TORS.  High risk factors associated with pathology as described above.  I recommend adjuvant radiotherapy for this patient.  He sees medical oncology later this morning to discuss concurrent chemotherapy.  He understands that adjuvant therapy would take place over approximately 6 weeks.  He is in agreement with the team's recommendations for Port-A-Cath and PEG feeding tube placement.   We again discussed the potential risks, benefits, and side effects of radiotherapy. We talked in detail about acute and late effects. We discussed that some of the most bothersome acute effects may be mucositis, dysgeusia, salivary changes, skin irritation, hair loss, dehydration, weight loss and fatigue. We talked about late effects which include but are not  necessarily limited to dysphagia, hypothyroidism, nerve injury, vascular injury, spinal cord injury, xerostomia, trismus, neck edema, and potential injury to any of the tissues in the head and neck region. No guarantees of treatment were given. A consent form was previously signed and placed in the patient's medical record. The patient is enthusiastic about proceeding with treatment. I look forward to participating in the patient's care.    Simulation (treatment planning) will take place after scatter protection devices are made as he has quite a bit of metal in his mouth.  We will refer him to a dentistry team at [facility] that provides this service, and ask for this device to be made as soon as possible.  We also discussed that the treatment of head and neck cancer is a multidisciplinary process to maximize treatment outcomes and quality of life. For this reason the following referrals have been or will be made:   Medical oncology to discuss chemotherapy    Nutritionist for nutrition support during and after treatment.   Speech language pathology for swallowing and/or speech therapy.   Social work for social support.    Physical therapy due to risk of lymphedema in neck and deconditioning.  He learned how to do trismus exercises with his dentist at [facility].  Encouraged him to continue to do these regularly  On date of service, in total, I spent 45 minutes on this encounter. Patient was seen in person.  __________________________________________   Lonie Peak, MD  This document serves as a record of services personally performed by Lonie Peak, MD. It was created on her behalf by Neena Rhymes, a trained medical scribe. The creation of this record is based on the scribe's personal observations and the provider's statements to them. This document has been checked and approved by the attending provider.

## 2023-04-27 ENCOUNTER — Inpatient Hospital Stay: Payer: Medicare Other | Attending: Hematology and Oncology | Admitting: Hematology and Oncology

## 2023-04-27 ENCOUNTER — Ambulatory Visit
Admission: RE | Admit: 2023-04-27 | Discharge: 2023-04-27 | Disposition: A | Discharge status: Home / Self Care | Payer: Medicare Other | Source: Ambulatory Visit | Attending: Radiation Oncology | Admitting: Radiation Oncology

## 2023-04-27 ENCOUNTER — Other Ambulatory Visit: Payer: Self-pay

## 2023-04-27 VITALS — BP 144/79 | HR 80 | Temp 98.0°F | Resp 18 | Wt 190.2 lb

## 2023-04-27 VITALS — BP 149/79 | HR 77 | Temp 97.5°F | Resp 18 | Wt 191.7 lb

## 2023-04-27 DIAGNOSIS — Z87442 Personal history of urinary calculi: Secondary | ICD-10-CM | POA: Diagnosis not present

## 2023-04-27 DIAGNOSIS — I714 Abdominal aortic aneurysm, without rupture, unspecified: Secondary | ICD-10-CM | POA: Insufficient documentation

## 2023-04-27 DIAGNOSIS — R918 Other nonspecific abnormal finding of lung field: Secondary | ICD-10-CM | POA: Insufficient documentation

## 2023-04-27 DIAGNOSIS — C090 Malignant neoplasm of tonsillar fossa: Secondary | ICD-10-CM

## 2023-04-27 DIAGNOSIS — R1310 Dysphagia, unspecified: Secondary | ICD-10-CM | POA: Diagnosis not present

## 2023-04-27 DIAGNOSIS — E785 Hyperlipidemia, unspecified: Secondary | ICD-10-CM | POA: Diagnosis not present

## 2023-04-27 DIAGNOSIS — F17210 Nicotine dependence, cigarettes, uncomplicated: Secondary | ICD-10-CM | POA: Diagnosis not present

## 2023-04-27 DIAGNOSIS — R599 Enlarged lymph nodes, unspecified: Secondary | ICD-10-CM | POA: Diagnosis not present

## 2023-04-27 DIAGNOSIS — Z5111 Encounter for antineoplastic chemotherapy: Secondary | ICD-10-CM | POA: Diagnosis present

## 2023-04-27 DIAGNOSIS — F17290 Nicotine dependence, other tobacco product, uncomplicated: Secondary | ICD-10-CM | POA: Diagnosis not present

## 2023-04-27 DIAGNOSIS — Z79899 Other long term (current) drug therapy: Secondary | ICD-10-CM | POA: Diagnosis not present

## 2023-04-27 HISTORY — DX: Calculus of kidney: N20.0

## 2023-04-27 NOTE — Progress Notes (Signed)
Oncology Nurse Navigator Documentation   Met with patient during initial consult with Dr. Al Pimple and follow up appointment with Dr. Basilio Cairo. He was accompanied by his wife and daughter.  Further introduced myself as his/their Navigator, explained my role as a member of the Care Team. Assisted with post-consult appt scheduling. I will contact [facility] to help with Scatter Protective devices. I will also work on getting a PAC and PEG scheduled closer to the date that he will start radiation.    They verbalized understanding of information provided. I encouraged them to call with questions/concerns moving forward.  Hedda Slade, RN, BSN, OCN Head & Neck Oncology Nurse Navigator [facility] [place] [phone]

## 2023-04-27 NOTE — Progress Notes (Signed)
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  We have discussed the following details about chemotherapy. We have discussed about role of cisplatin being a radiosensitizer in the treatment of head and neck cancer.  We have discussed about the curative intent of chemoradiation for this patient.  We have discussed about mechanism of action of cisplatin, adverse effects of cisplatin including but not limited to fatigue, nausea, vomiting, increased risk of infections, mucositis, ototoxicity, nephrotoxicity, peripheral neuropathy.  Patient understands that some of the side effects can be permanent and fatal.  We have discussed about role of Mediport and G-tube for chemotherapy administration and nutrition respectively since most of these patients have severe mucositis during the treatment.  I encouraged him to consider chemotherapy given his extranodal extension and high risk for recurrence.  He is on board.  Anticipate initiation of chemotherapy end of May.  He is agreeable to port and G-tube placement.  Our nurse navigator will coordinate his chemo education as well as his other appointments and he will come back and see [place] before initiating his first cycle of chemotherapy.  All his questions were answered to the best my knowledge.  Thank you for consulting [place] in the care of this patient.  Please not hesitate to contact us with any additional  questions or concerns.  HISTORY OF PRESENTING ILLNESS:  Ethan [name] 72 y.o. male is here because of SCC tongue, P16 positive.  This is a very pleasant 72 year old male patient with past medical history significant for abdominal aortic aneurysm status postrepair now diagnosed with squamous cell carcinoma of the base of the tongue at diagnosis had T1 N1 M0 status post definitive surgery had radical tonsillectomy right neck lymph node dissection with final pathology showing tumor size of 2.7 cm nonkeratinizing squamous cell carcinoma with focal maturation, HPV associated, lymphovascular invasion and perineural invasion identified, negative surgical margins, 8 out of 12 lymph nodes with carcinoma and the largest tumor deposit was 2.6 cm in greatest dimension extranodal extension present referred to medical oncology for adjuvant chemotherapy.  At baseline he is extremely healthy, very active, eats healthy and denies any major medical issues.  Initially when he saw Dr. Basilio Cairo before surgery, he was discussed the option for concurrent chemoradiation versus surgery but he chose to proceed with surgery.  He is here today with his wife and his daughter.  He denies any new health complaints today. He has some postop pain related to the surgery.  He otherwise stays active, mows his lawn regularly, he has over 20 acres of land.  Rest of the pertinent 10 point ROS reviewed and negative  MEDICAL HISTORY:  Past Medical History:  Diagnosis Date   AAA (abdominal aortic aneurysm) (HCC)    Hyperlipidemia    Kidney stones    Renal disorder     SURGICAL HISTORY: Past Surgical History:  Procedure Laterality Date   ABDOMINAL AORTIC ANEURYSM REPAIR  06/15/2011   EVAR   CYSTOSCOPY WITH RETROGRADE PYELOGRAM,  URETEROSCOPY AND STENT PLACEMENT Right 03/11/2015   Procedure: CYSTOSCOPY WITH RETROGRADE PYELOGRAM, AND RIGHT URETERAL STENT PLACEMENT;  Surgeon: Barron Alvine, MD;  Location: [facility];  Service: Urology;   Laterality: Right;   HERNIA REPAIR     LOWER BACK FUSION, L2-L3,AND L3-L4     RIGHT LOWER TIB-FEM FRACTURE     SHOULDER SURGERY Bilateral    SPINE SURGERY     THROAT SURGERY  04/06/2023    SOCIAL HISTORY: Social History   Socioeconomic History   Marital status: Married    Spouse name: Not on file   Number of children: Not on file   Years of education: Not on file   Highest education level: Not on file  Occupational History   Not on file  Tobacco Use   Smoking status: Some Days    Packs/day: .5    Types: Cigarettes   Smokeless tobacco: Never  Vaping Use   Vaping Use: Never used  Substance and Sexual Activity   Alcohol use: Yes    Alcohol/week: 0.0 standard drinks of alcohol    Comment: occ   Drug use: No   Sexual activity: Not Currently  Other Topics Concern   Not on file  Social History Narrative   Not on file   Social Determinants of Health   Financial Resource Strain: Not on file  Food Insecurity: No Food Insecurity (04/26/2023)   Hunger Vital Sign    Worried About Running Out of Food in the Last Year: Never true    Ran Out of Food in the Last Year: Never true  Transportation Needs: No Transportation Needs (04/26/2023)   PRAPARE - Administrator, Civil Service (Medical): No    Lack of Transportation (Non-Medical): No  Physical Activity: Not on file  Stress: Not on file  Social Connections: Not on file  Intimate Partner Violence: Not At Risk (04/26/2023)   Humiliation, Afraid, Rape, and Kick questionnaire    Fear of Current or Ex-Partner: No    Emotionally Abused: No    Physically Abused: No    Sexually Abused: No    FAMILY HISTORY: Family History  Problem Relation Age of Onset   Aneurysm Father    Stroke Mother    Heart disease Mother        Aneurysm   Hypertension Sister    Alcohol abuse Brother    Cancer Brother        mouth cancer   Aneurysm Paternal Uncle     ALLERGIES:  has No Known Allergies.  MEDICATIONS:  Current Outpatient  Medications  Medication Sig Dispense Refill   augmented betamethasone dipropionate (DIPROLENE-AF) 0.05 % cream APPLY TO RASH ON THE SKIN TWICE DAILY FOR 2 3 WEEKS.     buPROPion (WELLBUTRIN SR) 150 MG 12 hr tablet Start one week before quit date. Take 1 tab daily x 3 days, then 1 tab BID thereafter. 60 tablet 2   cyanocobalamin (VITAMIN B12) 1000 MCG/ML injection Inject 1,000 mcg into the muscle every 30 (thirty) days.     tamsulosin (FLOMAX) 0.4 MG CAPS capsule      No current facility-administered medications for this visit.     PHYSICAL EXAMINATION: ECOG PERFORMANCE STATUS: 0 - Asymptomatic  Vitals:   04/27/23 1027  BP: (!) 149/79  Pulse: 77  Resp: 18  Temp: (!) 97.5 F (36.4 C)  SpO2: 95%   Filed Weights   04/27/23 1027  Weight: 191 lb 11.2 oz (87 kg)    GENERAL:alert, no distress  and comfortable SKIN: skin color, texture, turgor are normal, no rashes or significant lesions EYES: normal, conjunctiva are pink and non-injected, sclera clear OROPHARYNX:no exudate, no erythema and lips, buccal mucosa, and tongue normal  NECK: Surgical scar appears to be healing well LYMPH:  no palpable lymphadenopathy in the cervical, axillary LUNGS: clear to auscultation and percussion with normal breathing effort HEART: regular rate & rhythm and no murmurs and no lower extremity edema ABDOMEN:abdomen soft, non-tender and normal bowel sounds Musculoskeletal:no cyanosis of digits and no clubbing  PSYCH: alert & oriented x 3 with fluent speech NEURO: no focal motor/sensory deficits  LABORATORY DATA:  I have reviewed the data as listed Lab Results  Component Value Date   WBC 8.4 03/06/2023   HGB 17.1 (H) 03/06/2023   HCT 48.7 03/06/2023   MCV 97.4 03/06/2023   PLT 198 03/06/2023     Chemistry      Component Value Date/Time   NA 141 03/06/2023 1255   K 4.2 03/06/2023 1255   CL 107 03/06/2023 1255   CO2 29 03/06/2023 1255   BUN 19 03/06/2023 1255   CREATININE 1.07 03/06/2023  1255      Component Value Date/Time   CALCIUM 9.3 03/06/2023 1255   ALKPHOS 63 03/06/2023 1255   AST 13 (L) 03/06/2023 1255   ALT 8 03/06/2023 1255   BILITOT 0.5 03/06/2023 1255       RADIOGRAPHIC STUDIES: I have personally reviewed the radiological images as listed and agreed with the findings in the report. No results found.  All questions were answered. The patient knows to call the clinic with any problems, questions or concerns.  I spent 45 minutes in the care of this patientincluding H and P, review of records, counseling and coordination of care.     Rachel Moulds, MD 04/27/2023 10:41 AM

## 2023-05-01 ENCOUNTER — Other Ambulatory Visit: Payer: Self-pay | Admitting: Hematology and Oncology

## 2023-05-01 ENCOUNTER — Encounter: Payer: Self-pay | Admitting: Hematology and Oncology

## 2023-05-01 DIAGNOSIS — C090 Malignant neoplasm of tonsillar fossa: Secondary | ICD-10-CM

## 2023-05-01 MED ORDER — DEXAMETHASONE 4 MG PO TABS: ORAL_TABLET | ORAL | 1 refills | Status: DC

## 2023-05-01 MED ORDER — ONDANSETRON HCL 8 MG PO TABS: 8.0000 mg | ORAL_TABLET | Freq: Three times a day (TID) | ORAL | 1 refills | Status: DC | PRN

## 2023-05-01 MED ORDER — PROCHLORPERAZINE MALEATE 10 MG PO TABS: 10.0000 mg | ORAL_TABLET | Freq: Four times a day (QID) | ORAL | 1 refills | Status: DC | PRN

## 2023-05-01 MED ORDER — LIDOCAINE-PRILOCAINE 2.5-2.5 % EX CREA: TOPICAL_CREAM | CUTANEOUS | 3 refills | Status: DC

## 2023-05-01 NOTE — Progress Notes (Signed)
START ON PATHWAY REGIMEN - Head and Neck     A cycle is every 7 days:     Cisplatin   **Always confirm dose/schedule in your pharmacy ordering system**  Patient Characteristics: Oropharynx, HPV Positive, Postoperative without Neoadjuvant Therapy (Pathologic Staging), pT0-4, pN1-2 or pT3-4, pN0, ENE or Positive Margins Disease Classification: Oropharynx HPV Status: Positive (+) Therapeutic Status: Postoperative without Neoadjuvant Therapy (Pathologic Staging) AJCC T Category: pT2 AJCC 8 Stage Grouping: II AJCC N Category: pN2 AJCC M Category: cM0 Extranodal Extension and Margin Status: ENE or Positive Margins Intent of Therapy: Curative Intent, Discussed with Patient

## 2023-05-02 ENCOUNTER — Other Ambulatory Visit: Payer: Self-pay

## 2023-05-02 DIAGNOSIS — C090 Malignant neoplasm of tonsillar fossa: Secondary | ICD-10-CM

## 2023-05-02 MED ORDER — PROCHLORPERAZINE MALEATE 10 MG PO TABS
10.0000 mg | ORAL_TABLET | Freq: Four times a day (QID) | ORAL | 1 refills | Status: DC | PRN
Start: 2023-05-02 — End: 2023-07-04

## 2023-05-02 MED ORDER — LIDOCAINE-PRILOCAINE 2.5-2.5 % EX CREA: TOPICAL_CREAM | CUTANEOUS | 3 refills | Status: DC

## 2023-05-02 MED ORDER — ONDANSETRON HCL 8 MG PO TABS
8.0000 mg | ORAL_TABLET | Freq: Three times a day (TID) | ORAL | 1 refills | Status: DC | PRN
Start: 2023-05-02 — End: 2023-07-04

## 2023-05-02 MED ORDER — DEXAMETHASONE 4 MG PO TABS: ORAL_TABLET | ORAL | 1 refills | Status: DC

## 2023-05-02 NOTE — Telephone Encounter (Signed)
Received message from pt's wife stating medications were sent to wrong phx.  Placed call to [facility] in [place] to cancel rx's sent by Dr Al Pimple and refilled to [facility] phx per MD.  Pt is aware and verbalized thanks.

## 2023-05-03 ENCOUNTER — Other Ambulatory Visit: Payer: Self-pay | Admitting: Hematology and Oncology

## 2023-05-03 DIAGNOSIS — C090 Malignant neoplasm of tonsillar fossa: Secondary | ICD-10-CM

## 2023-05-04 ENCOUNTER — Telehealth: Payer: Self-pay | Admitting: Hematology and Oncology

## 2023-05-04 NOTE — Telephone Encounter (Signed)
Spoke with patient confirming upcoming appointments  

## 2023-05-06 ENCOUNTER — Other Ambulatory Visit: Payer: Self-pay

## 2023-05-09 ENCOUNTER — Other Ambulatory Visit: Payer: Self-pay

## 2023-05-09 DIAGNOSIS — C090 Malignant neoplasm of tonsillar fossa: Secondary | ICD-10-CM

## 2023-05-09 NOTE — Progress Notes (Signed)
BUN and Crt ordered for iv start for ct sim per protocol.

## 2023-05-11 ENCOUNTER — Ambulatory Visit: Payer: Medicare Other | Admitting: Radiation Oncology

## 2023-05-11 ENCOUNTER — Ambulatory Visit: Payer: Medicare Other

## 2023-05-11 ENCOUNTER — Other Ambulatory Visit: Payer: Self-pay

## 2023-05-12 ENCOUNTER — Other Ambulatory Visit: Payer: Self-pay

## 2023-05-14 ENCOUNTER — Ambulatory Visit
Admission: RE | Admit: 2023-05-14 | Discharge: 2023-05-14 | Disposition: A | Discharge status: Home / Self Care | Payer: Medicare Other | Source: Ambulatory Visit | Attending: Hematology and Oncology | Admitting: Hematology and Oncology

## 2023-05-14 ENCOUNTER — Ambulatory Visit
Admission: RE | Admit: 2023-05-14 | Discharge: 2023-05-14 | Disposition: A | Discharge status: Home / Self Care | Payer: Medicare Other | Source: Ambulatory Visit | Attending: Radiation Oncology | Admitting: Radiation Oncology

## 2023-05-14 ENCOUNTER — Other Ambulatory Visit: Payer: Self-pay

## 2023-05-14 VITALS — BP 138/81 | HR 78 | Temp 97.6°F | Resp 19

## 2023-05-14 DIAGNOSIS — C090 Malignant neoplasm of tonsillar fossa: Secondary | ICD-10-CM

## 2023-05-14 LAB — BUN & CREATININE (CHCC)
BUN: 20 mg/dL (ref 8–23)
Creatinine: 1.11 mg/dL (ref 0.61–1.24)
GFR, Estimated: >60 mL/min (ref >60)

## 2023-05-14 MED ORDER — SODIUM CHLORIDE 0.9% FLUSH
10.0000 mL | INTRAVENOUS | Status: DC | PRN
  Administered 2023-05-14: 10 mL via INTRAVENOUS

## 2023-05-14 NOTE — Progress Notes (Signed)
Oncology Nurse Navigator Documentation   Met with Mr. Speciale and his wife to provide PEG education prior to his 05/28/23 placement appointment.   Using  PEG teaching device   and Teach Back, provided education for PEG use and care, including: hand hygiene, gravity bolus administration of daily water flushes and nutritional supplement, fluids and medications; care of tube insertion site including daily dressing change and cleaning; S&S of infection.   Mr. Giacomelli correctly verbalized procedures for gravity administration of water, dressing change and site care.  I provided written instructions for PEG flushing/dressing change in support of verbal instruction.   I provided/described contents of Start of Care Bolus Feeding Kit (3 60 cc syringes, 1 box 4x4 drainage sponges, 1 package mesh briefs, 1 roll paper tape, 1 case Osmolite 1.5).  He voiced understanding he is to start using Osmolite per guidance of Nutrition. He understands I will be available for ongoing PEG support. Provided barium sulfate prep which I obtained from [facility] and reviewed instructions.    Hedda Slade RN, BSN, OCN Head & Neck Oncology Nurse Navigator [facility] Phone # [phone]  Fax # [phone]

## 2023-05-14 NOTE — Progress Notes (Signed)
Pharmacist Chemotherapy Monitoring - Initial Assessment    Anticipated start date: 05/22/23   The following has been reviewed per standard work regarding the patient's treatment regimen: The patient's diagnosis, treatment plan and drug doses, and organ/hematologic function Lab orders and baseline tests specific to treatment regimen  The treatment plan start date, drug sequencing, and pre-medications Prior authorization status  Patient's documented medication list, including drug-drug interaction screen and prescriptions for anti-emetics and supportive care specific to the treatment regimen The drug concentrations, fluid compatibility, administration routes, and timing of the medications to be used The patient's access for treatment and lifetime cumulative dose history, if applicable  The patient's medication allergies and previous infusion related reactions, if applicable   Changes made to treatment plan:  N/A  Follow up needed:  N/A   Demetrius Charity, RPH, 05/14/2023  11:50 AM t

## 2023-05-14 NOTE — Progress Notes (Signed)
Has armband been applied?  Yes.    Does patient have an allergy to IV contrast dye?: No.   Has patient ever received premedication for IV contrast dye?: No.   Does patient take metformin?: No.  If patient does take metformin when was the last dose: N/A  Date of lab work: May 14, 2023 BUN: 20 CR: 1.11 eGfr: >60  IV site: antecubital right, condition patent and no redness  Has IV site been added to flowsheet?  Yes.    BP 138/81   Pulse 78   Temp 97.6 F (36.4 C)   Resp 19   SpO2 98%   This concludes the interview.   Ruel Favors, LPN

## 2023-05-14 NOTE — Progress Notes (Signed)
Oncology Nurse Navigator Documentation   To provide support, encouragement and care continuity, met with Ethan [name] and his wife during his CT SIM.  He tolerated procedure without difficulty, denied questions/concerns.    I encouraged him to call me prior to 5/28 /24 New Start.   Hedda Slade RN, BSN, OCN Head & Neck Oncology Nurse Navigator [facility] Phone # [phone]  Fax # [phone]

## 2023-05-16 ENCOUNTER — Other Ambulatory Visit: Payer: Medicare Other

## 2023-05-16 ENCOUNTER — Telehealth: Payer: Self-pay | Admitting: Hematology and Oncology

## 2023-05-16 ENCOUNTER — Ambulatory Visit (HOSPITAL_BASED_OUTPATIENT_CLINIC_OR_DEPARTMENT_OTHER): Payer: Medicare Other | Admitting: Hematology and Oncology

## 2023-05-16 ENCOUNTER — Ambulatory Visit: Payer: Medicare Other | Admitting: Radiation Oncology

## 2023-05-16 ENCOUNTER — Inpatient Hospital Stay: Payer: Medicare Other

## 2023-05-16 ENCOUNTER — Ambulatory Visit: Payer: Medicare Other

## 2023-05-16 DIAGNOSIS — C090 Malignant neoplasm of tonsillar fossa: Secondary | ICD-10-CM | POA: Diagnosis not present

## 2023-05-16 NOTE — Telephone Encounter (Signed)
Left patient a vm regarding upcoming appointment  

## 2023-05-16 NOTE — Progress Notes (Signed)
[facility] CONSULT NOTE  Patient Care Team: Practice, 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 We have discussed about adjuvant chemoradiation when we last met him. Recently we had a conversation that the RUL nodule is persistent and cavitary and hence is of concern that it could be malignant but Dr Basilio Cairo feels it could be primary lung and slow growing overall, so we should proceed with Chemoradiation and follow up on lung nodule. When I explained this to the patient, he would like to start with radiation as planned but at the same time was hoping we can do the bronch sooner than later. I did send a message to Dr Delton Coombes and Dr Tonia Brooms to see what's the timeline. I can always hold chemo for the first week and start with second week of radiation. He is on board with this.  HISTORY OF PRESENTING ILLNESS:  Ethan [name] 72 y.o. male is here because of SCC tongue, P16 positive.  This is a very pleasant 72 year old male patient with past medical history significant for abdominal aortic aneurysm status postrepair now diagnosed with squamous cell carcinoma of the base of the tongue at diagnosis had T1 N1 M0 status post definitive surgery had radical tonsillectomy right neck lymph node dissection with final pathology showing tumor size of 2.7 cm nonkeratinizing squamous cell carcinoma with focal maturation, HPV associated, lymphovascular invasion and perineural invasion identified, negative surgical  margins, 8 out of 12 lymph nodes with carcinoma and the largest tumor deposit was 2.6 cm in greatest dimension extranodal extension present referred to medical oncology for adjuvant chemotherapy.  At baseline he is extremely healthy, very active, eats healthy and denies any major medical issues.  Initially when he saw Dr. Basilio Cairo before surgery, he was discussed the option for concurrent chemoradiation versus surgery but he chose to proceed with surgery. When I last saw him, we discussed about concurrent CRT  Today's phone call was about persistent cavitary lung nodule in RUL.  MEDICAL HISTORY:  Past Medical History:  Diagnosis Date   AAA (abdominal aortic aneurysm) (HCC)    Hyperlipidemia    Kidney stones    Renal disorder     SURGICAL HISTORY: Past Surgical History:  Procedure Laterality Date   ABDOMINAL AORTIC ANEURYSM REPAIR  06/15/2011   EVAR   CYSTOSCOPY WITH RETROGRADE PYELOGRAM, URETEROSCOPY AND STENT PLACEMENT Right 03/11/2015   Procedure: CYSTOSCOPY WITH RETROGRADE PYELOGRAM, AND RIGHT URETERAL STENT PLACEMENT;  Surgeon: Barron Alvine, MD;  Location: [facility];  Service: Urology;  Laterality: Right;   HERNIA REPAIR     LOWER BACK FUSION, L2-L3,AND L3-L4     RIGHT LOWER TIB-FEM FRACTURE     SHOULDER SURGERY Bilateral    SPINE SURGERY     THROAT SURGERY  04/06/2023    SOCIAL HISTORY: Social History   Socioeconomic History   Marital status: Married    Spouse name: Not on file   Number of children: Not on file   Years of education: Not on file   Highest education level: Not on  file  Occupational History   Not on file  Tobacco Use   Smoking status: Some Days    Packs/day: .5    Types: Cigarettes   Smokeless tobacco: Never  Vaping Use   Vaping Use: Never used  Substance and Sexual Activity   Alcohol use: Yes    Alcohol/week: 0.0 standard drinks of alcohol    Comment: occ   Drug use: No   Sexual activity: Not Currently  Other Topics Concern   Not on file  Social  History Narrative   Not on file   Social Determinants of Health   Financial Resource Strain: Not on file  Food Insecurity: No Food Insecurity (04/26/2023)   Hunger Vital Sign    Worried About Running Out of Food in the Last Year: Never true    Ran Out of Food in the Last Year: Never true  Transportation Needs: No Transportation Needs (04/26/2023)   PRAPARE - Administrator, Civil Service (Medical): No    Lack of Transportation (Non-Medical): No  Physical Activity: Not on file  Stress: Not on file  Social Connections: Not on file  Intimate Partner Violence: Not At Risk (04/26/2023)   Humiliation, Afraid, Rape, and Kick questionnaire    Fear of Current or Ex-Partner: No    Emotionally Abused: No    Physically Abused: No    Sexually Abused: No    FAMILY HISTORY: Family History  Problem Relation Age of Onset   Aneurysm Father    Stroke Mother    Heart disease Mother        Aneurysm   Hypertension Sister    Alcohol abuse Brother    Cancer Brother        mouth cancer   Aneurysm Paternal Uncle     ALLERGIES:  has No Known Allergies.  MEDICATIONS:  Current Outpatient Medications  Medication Sig Dispense Refill   augmented betamethasone dipropionate (DIPROLENE-AF) 0.05 % cream APPLY TO RASH ON THE SKIN TWICE DAILY FOR 2 3 WEEKS.     buPROPion (WELLBUTRIN SR) 150 MG 12 hr tablet Start one week before quit date. Take 1 tab daily x 3 days, then 1 tab BID thereafter. 60 tablet 2   cyanocobalamin (VITAMIN B12) 1000 MCG/ML injection Inject 1,000 mcg into the muscle every 30 (thirty) days.     dexamethasone (DECADRON) 4 MG tablet Take 2 tablets (8 mg) by mouth daily x 3 days starting the day after cisplatin chemotherapy. Take with food. 30 tablet 1   lidocaine-prilocaine (EMLA) cream Apply to affected area once 30 g 3   ondansetron (ZOFRAN) 8 MG tablet Take 1 tablet (8 mg total) by mouth every 8 (eight) hours as needed for nausea or vomiting. Start on the third day after  cisplatin. 30 tablet 1   prochlorperazine (COMPAZINE) 10 MG tablet Take 1 tablet (10 mg total) by mouth every 6 (six) hours as needed (Nausea or vomiting). 30 tablet 1   tamsulosin (FLOMAX) 0.4 MG CAPS capsule      No current facility-administered medications for this visit.     PHYSICAL EXAMINATION: ECOG PERFORMANCE STATUS: 0 - Asymptomatic  There were no vitals filed for this visit.  There were no vitals filed for this visit.   VS and PE not done, telephone visit.  LABORATORY DATA:  I have reviewed the data as listed Lab Results  Component Value Date   WBC 8.4 03/06/2023   HGB 17.1 (H) 03/06/2023   HCT 48.7 03/06/2023   MCV 97.4  03/06/2023   PLT 198 03/06/2023     Chemistry      Component Value Date/Time   NA 141 03/06/2023 1255   K 4.2 03/06/2023 1255   CL 107 03/06/2023 1255   CO2 29 03/06/2023 1255   BUN 20 05/14/2023 1230   CREATININE 1.11 05/14/2023 1230      Component Value Date/Time   CALCIUM 9.3 03/06/2023 1255   ALKPHOS 63 03/06/2023 1255   AST 13 (L) 03/06/2023 1255   ALT 8 03/06/2023 1255   BILITOT 0.5 03/06/2023 1255       RADIOGRAPHIC STUDIES: I have personally reviewed the radiological images as listed and agreed with the findings in the report. No results found.  All questions were answered. The patient knows to call the clinic with any problems, questions or concerns.  I spent 20 minutes in the care of this patientincluding H and P, review of records, counseling and coordination of care.  I connected with  Ethan [name] on 05/16/23 by a telephone application and verified that I am speaking with the correct person using two identifiers.   I discussed the limitations of evaluation and management by telemedicine. The patient expressed understanding and agreed to proceed.     Rachel Moulds, MD 05/16/2023 3:12 PM

## 2023-05-17 ENCOUNTER — Telehealth: Payer: Self-pay | Admitting: *Deleted

## 2023-05-17 ENCOUNTER — Encounter: Payer: Self-pay | Admitting: Hematology and Oncology

## 2023-05-17 ENCOUNTER — Other Ambulatory Visit: Payer: Medicare Other

## 2023-05-17 ENCOUNTER — Telehealth: Payer: Self-pay

## 2023-05-17 ENCOUNTER — Other Ambulatory Visit: Payer: Self-pay

## 2023-05-17 DIAGNOSIS — R911 Solitary pulmonary nodule: Secondary | ICD-10-CM

## 2023-05-17 DIAGNOSIS — C090 Malignant neoplasm of tonsillar fossa: Secondary | ICD-10-CM

## 2023-05-17 NOTE — Telephone Encounter (Signed)
Called patient to inform of CT for 05-18-23- arrival time- 10:15 am @ [facility], no restrictions to test, spoke with patient and he is aware of this scan and the instructions, patient was previously set up for today [facility] [place], but he declined this appt.,

## 2023-05-18 ENCOUNTER — Ambulatory Visit: Admission: RE | Admit: 2023-05-18 | Payer: Medicare Other | Source: Ambulatory Visit

## 2023-05-18 ENCOUNTER — Other Ambulatory Visit: Payer: Self-pay

## 2023-05-18 ENCOUNTER — Ambulatory Visit (HOSPITAL_BASED_OUTPATIENT_CLINIC_OR_DEPARTMENT_OTHER)
Admission: RE | Admit: 2023-05-18 | Discharge: 2023-05-18 | Disposition: A | Discharge status: Home / Self Care | Payer: Medicare Other | Source: Ambulatory Visit | Attending: Radiation Oncology | Admitting: Radiation Oncology

## 2023-05-18 ENCOUNTER — Ambulatory Visit (HOSPITAL_BASED_OUTPATIENT_CLINIC_OR_DEPARTMENT_OTHER): Admission: RE | Admit: 2023-05-18 | Payer: Medicare Other | Source: Ambulatory Visit

## 2023-05-18 ENCOUNTER — Encounter (HOSPITAL_BASED_OUTPATIENT_CLINIC_OR_DEPARTMENT_OTHER): Payer: Self-pay

## 2023-05-18 DIAGNOSIS — R911 Solitary pulmonary nodule: Secondary | ICD-10-CM

## 2023-05-18 DIAGNOSIS — C090 Malignant neoplasm of tonsillar fossa: Secondary | ICD-10-CM

## 2023-05-18 MED FILL — Dexamethasone Sodium Phosphate Inj 100 MG/10ML: INTRAMUSCULAR | Qty: 1 | Status: AC

## 2023-05-18 MED FILL — Fosaprepitant Dimeglumine For IV Infusion 150 MG (Base Eq): INTRAVENOUS | Qty: 5 | Status: AC

## 2023-05-22 ENCOUNTER — Inpatient Hospital Stay: Payer: Medicare Other | Admitting: Dietician

## 2023-05-22 ENCOUNTER — Inpatient Hospital Stay (HOSPITAL_BASED_OUTPATIENT_CLINIC_OR_DEPARTMENT_OTHER): Payer: Medicare Other | Admitting: Adult Health

## 2023-05-22 ENCOUNTER — Inpatient Hospital Stay: Payer: Medicare Other

## 2023-05-22 ENCOUNTER — Encounter: Payer: Self-pay | Admitting: Adult Health

## 2023-05-22 ENCOUNTER — Other Ambulatory Visit: Payer: Medicare Other

## 2023-05-22 VITALS — BP 152/69 | HR 67 | Resp 18

## 2023-05-22 DIAGNOSIS — C090 Malignant neoplasm of tonsillar fossa: Secondary | ICD-10-CM

## 2023-05-22 DIAGNOSIS — Z5111 Encounter for antineoplastic chemotherapy: Secondary | ICD-10-CM | POA: Diagnosis not present

## 2023-05-22 LAB — CBC WITH DIFFERENTIAL (CANCER CENTER ONLY)
Abs Immature Granulocytes: 0.03 10*3/uL (ref 0.00–0.07)
Basophils Absolute: 0.1 10*3/uL (ref 0.0–0.1)
Basophils Relative: 1 %
Eosinophils Absolute: 0.2 10*3/uL (ref 0.0–0.5)
Eosinophils Relative: 3 %
HCT: 42.9 % (ref 39.0–52.0)
Hemoglobin: 15.2 g/dL (ref 13.0–17.0)
Immature Granulocytes: 1 %
Lymphocytes Relative: 15 %
Lymphs Abs: 1 10*3/uL (ref 0.7–4.0)
MCH: 34.2 pg — ABNORMAL HIGH (ref 26.0–34.0)
MCHC: 35.4 g/dL (ref 30.0–36.0)
MCV: 96.4 fL (ref 80.0–100.0)
Monocytes Absolute: 0.4 10*3/uL (ref 0.1–1.0)
Monocytes Relative: 6 %
Neutro Abs: 4.9 10*3/uL (ref 1.7–7.7)
Neutrophils Relative %: 74 %
Platelet Count: 163 10*3/uL (ref 150–400)
RBC: 4.45 MIL/uL (ref 4.22–5.81)
RDW: 12.5 % (ref 11.5–15.5)
WBC Count: 6.5 10*3/uL (ref 4.0–10.5)
nRBC: 0 % (ref 0.0–0.2)

## 2023-05-22 LAB — MAGNESIUM: Magnesium: 1.8 mg/dL (ref 1.7–2.4)

## 2023-05-22 LAB — BASIC METABOLIC PANEL - CANCER CENTER ONLY
Anion gap: 7 (ref 5–15)
BUN: 16 mg/dL (ref 8–23)
CO2: 23 mmol/L (ref 22–32)
Calcium: 8.5 mg/dL — ABNORMAL LOW (ref 8.9–10.3)
Chloride: 108 mmol/L (ref 98–111)
Creatinine: 0.97 mg/dL (ref 0.61–1.24)
GFR, Estimated: >60 mL/min (ref >60)
Glucose, Bld: 123 mg/dL — ABNORMAL HIGH (ref 70–99)
Potassium: 3.6 mmol/L (ref 3.5–5.1)
Sodium: 138 mmol/L (ref 135–145)

## 2023-05-22 MED ORDER — POTASSIUM CHLORIDE IN NACL 20-0.9 MEQ/L-% IV SOLN
Freq: Once | INTRAVENOUS | Status: AC
  Filled 2023-05-22: qty 1000

## 2023-05-22 MED ORDER — SODIUM CHLORIDE 0.9 % IV SOLN
40.0000 mg/m2 | Freq: Once | INTRAVENOUS | Status: AC
  Administered 2023-05-22: 86 mg via INTRAVENOUS
  Filled 2023-05-22: qty 86

## 2023-05-22 MED ORDER — SODIUM CHLORIDE 0.9 % IV SOLN
150.0000 mg | Freq: Once | INTRAVENOUS | Status: AC
  Administered 2023-05-22: 150 mg via INTRAVENOUS
  Filled 2023-05-22: qty 150

## 2023-05-22 MED ORDER — SODIUM CHLORIDE 0.9 % IV SOLN
10.0000 mg | Freq: Once | INTRAVENOUS | Status: AC
  Administered 2023-05-22: 10 mg via INTRAVENOUS
  Filled 2023-05-22: qty 10

## 2023-05-22 MED ORDER — MAGNESIUM SULFATE 2 GM/50ML IV SOLN
2.0000 g | Freq: Once | INTRAVENOUS | Status: AC
  Administered 2023-05-22: 2 g via INTRAVENOUS
  Filled 2023-05-22: qty 50

## 2023-05-22 MED ORDER — PALONOSETRON HCL INJECTION 0.25 MG/5ML
0.2500 mg | Freq: Once | INTRAVENOUS | Status: AC
  Administered 2023-05-22: 0.25 mg via INTRAVENOUS
  Filled 2023-05-22: qty 5

## 2023-05-22 MED ORDER — SODIUM CHLORIDE 0.9 % IV SOLN: Freq: Once | INTRAVENOUS | Status: AC

## 2023-05-22 NOTE — Progress Notes (Signed)
Nutrition Assessment   Reason for Assessment: HNC   ASSESSMENT: 72 year old male with SCC of right tonsil, p16 positive s/p TORS resection under the care of Dr. Hezzie Bump 04/06/23. He is receiving concurrent chemoradiation with weekly cisplatin (first 5/28). Patient is under the care of Dr. Al Pimple and Dr. Basilio Cairo.   No significant past medical history   PEG planned 6/3  Met with patient in infusion. His daughter is present at visit today. He reports only tolerating thin liquids for 3-4 weeks after having tonsils removed in April. Patient reports appetite has returned to baseline and eating 3 good meals. He is tolerating regular textures without difficulty. Recalls apple fritters, bacon, coffee for breakfast, 2 hotdogs (chili, mustard, slaw), ice cream for lunch, 2 slices veggie pizza, pimento sandwich for dinner. Patient does not like the taste of water, usually drinks one bottle/day. He did drink 40 ounces this morning before treatment. Patient has been drinking 2 Boost High protein. He likes the chocolate flavor. Patient does not like taste of Ensure. He denies nausea, vomiting, diarrhea, constipation.    Nutrition Focused Physical Exam: deferred    Medications: reviewed    Labs: glucose 123   Anthropometrics: Weights have increased from 191 lb 11.2 oz on 5/3  Height: 6'3" Weight: 196 lb 3.2 oz UBW: 205 lb (prior to TORS per pt) BMI: 24.52   Estimated Energy Needs  Kcals: 2300-2600 Protein: 107-130 Fluid: >/= 2.3 L   NUTRITION DIAGNOSIS: Predicted suboptimal intake related to [facility] and associated treatment as evidenced by side effects of radiation therapy to head/neck (dry mouth, thick saliva, diminished taste, sore throat) likely to affect patient ability to eat and drink as normal   INTERVENTION:  Educated on importance of adequate calorie and protein energy intake to maintain strength/weights during treatment Encouraged small frequent meals and snacks vs 3 larger meals/day  - snack ideas, soft moist high protein foods, shake recipes  Continue drinking 2 Boost Plus/equivalent - samples of Boost [employer] + coupons provided  Discussed importance of oral care. Educated on baking soda salt water rinses, encouraged rinses several times daily - handout with recipe provided  Patient will work to increase intake of water Pending PEG placement 6/3 - will complete bolus education as appropriate  Contact information given    MONITORING, EVALUATION, GOAL: Patient will tolerate increased calories and protein to minimize weight loss during treatment    Next Visit: Tuesday June 4 during infusion

## 2023-05-22 NOTE — Progress Notes (Signed)
[facility] Cancer Follow up:    Practice, [facility] [address] [place] [address]   DIAGNOSIS:  Cancer Staging  Cancer of tonsillar fossa [facility]) Staging form: Pharynx - HPV-Mediated Oropharynx, AJCC 8th Edition - Clinical stage from 03/06/2023: Stage I (cT1, cN1, cM0, p16+) - Unsigned Stage prefix: Initial diagnosis - Pathologic stage from 04/27/2023: Stage II (pT2, pN2, cM0, p16+) - Signed by Lonie Peak, MD on 04/27/2023 Stage prefix: Initial diagnosis  Patient Care Team: Practice, [facility] as PCP - General Lonie Peak, MD as Attending Physician (Radiation Oncology) Malmfelt, Lise Auer, RN as Registered Nurse Rachel Moulds, MD as Consulting Physician (Hematology and Oncology) Corey Skains, MD as Referring Physician (Otolaryngology) Noreene Larsson, RD as Dietitian (Dietician) Richrd Humbles (Dentistry)   SUMMARY OF ONCOLOGIC HISTORY: Oncology History  Cancer of tonsillar fossa (HCC)  02/07/2023 Initial Biopsy   Biopsy of the right tonsil  revealed: squamous cell carcinoma, p16 positive.    03/12/2023 PET scan   Right tonsillar mass as tracer avid and concerning for malignancy; hypermetabolic right level 2 and level 3 cervical lymph nodes compatible with metastatic adenopathy; indeterminate tracer avid right upper lobe elongated area of increased soft tissue thickening within the medial right upper lobe (SUV max of 5.08); and nonspecific mild tracer avid left hilar lymph nodes.    03/20/2023 Miscellaneous   Dentistry evaluation: Extractions of #18 and #19 with Dr. Azucena Cecil   04/06/2023 Surgery   TORS resection (Dr. Hezzie Bump): 2.7 cm tumor, histology of nonkeratinizing squamous cell carcinoma with focal maturation, HPV associated; + LVI and PNI; all margins negative.  8/12 + LN including right level 2A, 3, and 4 being +.  Extranodal extension present, largest tumor deposit 2.6cm.     04/27/2023 Cancer Staging   Staging form:  Pharynx - HPV-Mediated Oropharynx, AJCC 8th Edition - Pathologic stage from 04/27/2023: Stage II (pT2, pN2, cM0, p16+) - Signed by Lonie Peak, MD on 04/27/2023 Stage prefix: Initial diagnosis   05/22/2023 -  Chemotherapy   Patient is on Treatment Plan : HEAD/NECK Cisplatin (40) q7d     05/23/2023 - 07/04/2023 Radiation Therapy   Concurrent chemoradiation     CURRENT THERAPY: concurrent chemoradiation  INTERVAL HISTORY: Ethan [name] 72 y.o. male returns for f/u prior to beginning concurrent chemoradiation accompanied by his wife and daughter.  He is doing well today.    Since his last visit he underwent CT chest that demonstrated cavitary right upper lobe nodule similar in size comparing to March 12, 2023, borderline enlarged right lower paratracheal lymph node, aortic atherosclerosis, ascending thoracic aortic aneurysm 4.2 cm in diameter and emphysema.  Is concerned about the CT scans and the fact that the thoracic aneurysm has not previously been noted on any scans and also wants to know more about the plan for his lung nodule.    He says he is feeling well today.  He still has some numbness between his incision site on his right neck from his tonsillectomy up to his right mandible.  He denies any pain.  He has undergone his dental extractions and tells me that those are well-healed.  He has received dental fluoride trays from the dentist at [facility]   Patient Active Problem List   Diagnosis Date Noted   Cancer of tonsillar fossa (HCC) 03/07/2023   Arthralgia 08/29/2018   Weight loss 08/29/2018   Ureteral calculus, right 03/11/2015   Ureteral calculus 03/11/2015   Aftercare following surgery of the circulatory  system, NEC 01/12/2014   Abdominal aneurysm without mention of rupture 07/08/2012    has No Known Allergies.  MEDICAL HISTORY: Past Medical History:  Diagnosis Date   AAA (abdominal aortic aneurysm) (HCC)    Hyperlipidemia    Kidney stones    Renal disorder      SURGICAL HISTORY: Past Surgical History:  Procedure Laterality Date   ABDOMINAL AORTIC ANEURYSM REPAIR  06/15/2011   EVAR   CYSTOSCOPY WITH RETROGRADE PYELOGRAM, URETEROSCOPY AND STENT PLACEMENT Right 03/11/2015   Procedure: CYSTOSCOPY WITH RETROGRADE PYELOGRAM, AND RIGHT URETERAL STENT PLACEMENT;  Surgeon: Barron Alvine, MD;  Location: [facility];  Service: Urology;  Laterality: Right;   HERNIA REPAIR     LOWER BACK FUSION, L2-L3,AND L3-L4     RIGHT LOWER TIB-FEM FRACTURE     SHOULDER SURGERY Bilateral    SPINE SURGERY     THROAT SURGERY  04/06/2023    SOCIAL HISTORY: Social History   Socioeconomic History   Marital status: Married    Spouse name: Not on file   Number of children: Not on file   Years of education: Not on file   Highest education level: Not on file  Occupational History   Not on file  Tobacco Use   Smoking status: Some Days    Packs/day: .5    Types: Cigarettes   Smokeless tobacco: Never  Vaping Use   Vaping Use: Never used  Substance and Sexual Activity   Alcohol use: Yes    Alcohol/week: 0.0 standard drinks of alcohol    Comment: occ   Drug use: No   Sexual activity: Not Currently  Other Topics Concern   Not on file  Social History Narrative   Not on file   Social Determinants of Health   Financial Resource Strain: Not on file  Food Insecurity: No Food Insecurity (04/26/2023)   Hunger Vital Sign    Worried About Running Out of Food in the Last Year: Never true    Ran Out of Food in the Last Year: Never true  Transportation Needs: No Transportation Needs (04/26/2023)   PRAPARE - Administrator, Civil Service (Medical): No    Lack of Transportation (Non-Medical): No  Physical Activity: Not on file  Stress: Not on file  Social Connections: Not on file  Intimate Partner Violence: Not At Risk (04/26/2023)   Humiliation, Afraid, Rape, and Kick questionnaire    Fear of Current or Ex-Partner: No    Emotionally Abused: No    Physically  Abused: No    Sexually Abused: No    FAMILY HISTORY: Family History  Problem Relation Age of Onset   Aneurysm Father    Stroke Mother    Heart disease Mother        Aneurysm   Hypertension Sister    Alcohol abuse Brother    Cancer Brother        mouth cancer   Aneurysm Paternal Uncle     Review of Systems  Constitutional:  Negative for appetite change, chills, fatigue, fever and unexpected weight change.  HENT:   Negative for hearing loss, lump/mass and trouble swallowing.   Eyes:  Negative for eye problems and icterus.  Respiratory:  Negative for chest tightness, cough and shortness of breath.   Cardiovascular:  Negative for chest pain, leg swelling and palpitations.  Gastrointestinal:  Negative for abdominal distention, abdominal pain, constipation, diarrhea, nausea and vomiting.  Endocrine: Negative for hot flashes.  Genitourinary:  Negative for difficulty urinating.  Musculoskeletal:  Negative for arthralgias.  Skin:  Negative for itching and rash.  Neurological:  Negative for dizziness, extremity weakness, headaches and numbness.  Hematological:  Negative for adenopathy. Does not bruise/bleed easily.  Psychiatric/Behavioral:  Negative for depression. The patient is not nervous/anxious.       PHYSICAL EXAMINATION   Vitals:   05/22/23 0835  BP: (!) 144/73  Pulse: 76  Resp: 18  Temp: 97.9 F (36.6 C)  SpO2: 99%    Physical Exam Constitutional:      General: He is not in acute distress.    Appearance: Normal appearance. He is not toxic-appearing.  HENT:     Head: Normocephalic and atraumatic.     Mouth/Throat:     Mouth: Mucous membranes are moist.     Pharynx: Oropharynx is clear. No oropharyngeal exudate or posterior oropharyngeal erythema.  Eyes:     General: No scleral icterus. Cardiovascular:     Rate and Rhythm: Normal rate and regular rhythm.     Pulses: Normal pulses.     Heart sounds: Normal heart sounds.  Pulmonary:     Effort: Pulmonary  effort is normal.     Breath sounds: Normal breath sounds.  Abdominal:     General: Abdomen is flat. Bowel sounds are normal. There is no distension.     Palpations: Abdomen is soft.     Tenderness: There is no abdominal tenderness.  Musculoskeletal:        General: No swelling.     Cervical back: Neck supple.  Lymphadenopathy:     Cervical: No cervical adenopathy.  Skin:    General: Skin is warm and dry.     Findings: No rash.  Neurological:     General: No focal deficit present.     Mental Status: He is alert.  Psychiatric:        Mood and Affect: Mood normal.        Behavior: Behavior normal.     LABORATORY DATA:  CBC    Component Value Date/Time   WBC 6.5 05/22/2023 0803   WBC 7.3 03/12/2015 0505   RBC 4.45 05/22/2023 0803   HGB 15.2 05/22/2023 0803   HCT 42.9 05/22/2023 0803   PLT 163 05/22/2023 0803   MCV 96.4 05/22/2023 0803   MCH 34.2 (H) 05/22/2023 0803   MCHC 35.4 05/22/2023 0803   RDW 12.5 05/22/2023 0803   LYMPHSABS 1.0 05/22/2023 0803   MONOABS 0.4 05/22/2023 0803   EOSABS 0.2 05/22/2023 0803   BASOSABS 0.1 05/22/2023 0803    CMP     Component Value Date/Time   NA 138 05/22/2023 0803   K 3.6 05/22/2023 0803   CL 108 05/22/2023 0803   CO2 23 05/22/2023 0803   GLUCOSE 123 (H) 05/22/2023 0803   BUN 16 05/22/2023 0803   CREATININE 0.97 05/22/2023 0803   CALCIUM 8.5 (L) 05/22/2023 0803   PROT 7.2 03/06/2023 1255   ALBUMIN 4.3 03/06/2023 1255   AST 13 (L) 03/06/2023 1255   ALT 8 03/06/2023 1255   ALKPHOS 63 03/06/2023 1255   BILITOT 0.5 03/06/2023 1255   GFRNONAA >60 05/22/2023 0803   GFRAA 49 (L) 03/12/2015 0505        ASSESSMENT and THERAPY PLAN:   Cancer of tonsillar fossa (HCC) Ethan [name] is a 72 year old man with 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  He is here today for evaluation prior to receiving his first round of cisplatin chemoradiation.  His labs today are  stable.  He will proceed with treatment.   He did want to clarify a few things about his diagnosis and understand better the lung nodule and reasons around not biopsying it.  Dr. Al Pimple did visit with Ethan [name] and his wife and daughter to review this and answer any questions they had.  He had a question about his thoracic aortic aneurysm as it has not been visualized on previous scans.  I called the radiology reading room and asked that they reach out to the reading radiologist so they can review and addend this report as necessary.  In a lot of this visit I reviewed and updated his care team's along with his oncology history.  He will return weekly for chemo.  He knows to call for any questions or concerns prior to his next visit with [place].   All questions were answered. The patient knows to call the clinic with any problems, questions or concerns. We can certainly see the patient much sooner if necessary.   Lillard Anes, NP 05/22/23 12:46 PM Medical Oncology and Hematology [facility] [address] [place], [place] [address] Tel. [phone]    Fax. [phone]  I spoke to Mr. Kellough today, his daughter and his wife are also present at the time of the visit.  We have had a discussion with Dr. Basilio Cairo, Dr. Tonia Brooms about the lung nodule in the right lung.  Most recent super D CT suggests stability over the past 2 months hence after much discussion we believe this could be a slow-growing primary lung cancer.  I have explained this to Mr. Cubbison over the phone as well as in person.  He should continue planned adjuvant chemoradiation given his head and neck cancer with very locally advanced disease which is at high risk of recurrence.  Once he is done with chemoradiation, he can proceed with investigation of this lung nodule, may need a biopsy or SBRT depending on recommendations from Dr. Tonia Brooms and Dr. Basilio Cairo.  Total time spent: 30 min  *Total Encounter Time as defined by the Centers for Medicare and Medicaid Services includes, in  addition to the face-to-face time of a patient visit (documented in the note above) non-face-to-face time: obtaining and reviewing outside history, ordering and reviewing medications, tests or procedures, care coordination (communications with other health care professionals or caregivers) and documentation in the medical record.

## 2023-05-22 NOTE — Patient Instructions (Signed)
[facility]  Discharge Instructions: Thank you for choosing [facility] to provide your oncology and hematology care.   If you have a lab appointment with the Cancer Center, please go directly to the Cancer Center and check in at the registration area.   Wear comfortable clothing and clothing appropriate for easy access to any Portacath or PICC line.   We strive to give you quality time with your provider. You may need to reschedule your appointment if you arrive late (15 or more minutes).  Arriving late affects you and other patients whose appointments are after yours.  Also, if you miss three or more appointments without notifying the office, you may be dismissed from the clinic at the provider's discretion.      For prescription refill requests, have your pharmacy contact our office and allow 72 hours for refills to be completed.    Today you received the following chemotherapy and/or immunotherapy agents: Cisplatin      To help prevent nausea and vomiting after your treatment, we encourage you to take your nausea medication as directed.  BELOW ARE SYMPTOMS THAT SHOULD BE REPORTED IMMEDIATELY: *FEVER GREATER THAN 100.4 F (38 C) OR HIGHER *CHILLS OR SWEATING *NAUSEA AND VOMITING THAT IS NOT CONTROLLED WITH YOUR NAUSEA MEDICATION *UNUSUAL SHORTNESS OF BREATH *UNUSUAL BRUISING OR BLEEDING *URINARY PROBLEMS (pain or burning when urinating, or frequent urination) *BOWEL PROBLEMS (unusual diarrhea, constipation, pain near the anus) TENDERNESS IN MOUTH AND THROAT WITH OR WITHOUT PRESENCE OF ULCERS (sore throat, sores in mouth, or a toothache) UNUSUAL RASH, SWELLING OR PAIN  UNUSUAL VAGINAL DISCHARGE OR ITCHING   Items with * indicate a potential emergency and should be followed up as soon as possible or go to the Emergency Department if any problems should occur.  Please show the CHEMOTHERAPY ALERT CARD or IMMUNOTHERAPY ALERT CARD at  check-in to the Emergency Department and triage nurse.  Should you have questions after your visit or need to cancel or reschedule your appointment, please contact [facility]  Dept: [phone]  and follow the prompts.  Office hours are 8:00 a.m. to 4:30 p.m. Monday - Friday. Please note that voicemails left after 4:00 p.m. may not be returned until the following business day.  We are closed weekends and major holidays. You have access to a nurse at all times for urgent questions. Please call the main number to the clinic Dept: [phone] and follow the prompts.   For any non-urgent questions, you may also contact your provider using MyChart. We now offer e-Visits for anyone 35 and older to request care online for non-urgent symptoms. For details visit [email or web address].   Also download the MyChart app! Go to the app store, search "MyChart", open the app, select [place], and log in with your MyChart username and password.   Cisplatin Injection What is this medication? CISPLATIN (SIS pla tin) treats some types of cancer. It works by slowing down the growth of cancer cells. This medicine may be used for other purposes; ask your health care provider or pharmacist if you have questions. COMMON BRAND NAME(S): Platinol, Platinol -AQ What should I tell my care team before I take this medication? They need to know if you have any of these conditions: Eye disease, vision problems Hearing problems Kidney disease Low blood counts, such as low white cells, platelets, or red blood cells Tingling of the fingers or toes, or other nerve disorder An  unusual or allergic reaction to cisplatin, carboplatin, oxaliplatin, other medications, foods, dyes, or preservatives If you or your partner are pregnant or trying to get pregnant Breast-feeding How should I use this medication? This medication is injected into a vein. It is given by your care team in a hospital  or clinic setting. Talk to your care team about the use of this medication in children. Special care may be needed. Overdosage: If you think you have taken too much of this medicine contact a poison control center or emergency room at once. NOTE: This medicine is only for you. Do not share this medicine with others. What if I miss a dose? Keep appointments for follow-up doses. It is important not to miss your dose. Call your care team if you are unable to keep an appointment. What may interact with this medication? Do not take this medication with any of the following: Live virus vaccines This medication may also interact with the following: Certain antibiotics, such as amikacin, gentamicin, neomycin, polymyxin B, streptomycin, tobramycin, vancomycin Foscarnet This list may not describe all possible interactions. Give your health care provider a list of all the medicines, herbs, non-prescription drugs, or dietary supplements you use. Also tell them if you smoke, drink alcohol, or use illegal drugs. Some items may interact with your medicine. What should I watch for while using this medication? Your condition will be monitored carefully while you are receiving this medication. You may need blood work done while taking this medication. This medication may make you feel generally unwell. This is not uncommon, as chemotherapy can affect healthy cells as well as cancer cells. Report any side effects. Continue your course of treatment even though you feel ill unless your care team tells you to stop. This medication may increase your risk of getting an infection. Call your care team for advice if you get a fever, chills, sore throat, or other symptoms of a cold or flu. Do not treat yourself. Try to avoid being around people who are sick. Avoid taking medications that contain aspirin, acetaminophen, ibuprofen, naproxen, or ketoprofen unless instructed by your care team. These medications may hide a  fever. This medication may increase your risk to bruise or bleed. Call your care team if you notice any unusual bleeding. Be careful brushing or flossing your teeth or using a toothpick because you may get an infection or bleed more easily. If you have any dental work done, tell your dentist you are receiving this medication. Drink fluids as directed while you are taking this medication. This will help protect your kidneys. Call your care team if you get diarrhea. Do not treat yourself. Talk to your care team if you or your partner wish to become pregnant or think you might be pregnant. This medication can cause serious birth defects if taken during pregnancy and for 14 months after the last dose. A negative pregnancy test is required before starting this medication. A reliable form of contraception is recommended while taking this medication and for 14 months after the last dose. Talk to your care team about effective forms of contraception. Do not father a child while taking this medication and for 11 months after the last dose. Use a condom during sex during this time period. Do not breast-feed while taking this medication. This medication may cause infertility. Talk to your care team if you are concerned about your fertility. What side effects may I notice from receiving this medication? Side effects that you should report to your  care team as soon as possible: Allergic reactions--skin rash, itching, hives, swelling of the face, lips, tongue, or throat Eye pain, change in vision, vision loss Hearing loss, ringing in ears Infection--fever, chills, cough, sore throat, wounds that don't heal, pain or trouble when passing urine, general feeling of discomfort or being unwell Kidney injury--decrease in the amount of urine, swelling of the ankles, hands, or feet Low red blood cell level--unusual weakness or fatigue, dizziness, headache, trouble breathing Painful swelling, warmth, or redness of the skin,  blisters or sores at the infusion site Pain, tingling, or numbness in the hands or feet Unusual bruising or bleeding Side effects that usually do not require medical attention (report to your care team if they continue or are bothersome): Hair loss Nausea Vomiting This list may not describe all possible side effects. Call your doctor for medical advice about side effects. You may report side effects to FDA at 1-800-FDA-1088. Where should I keep my medication? This medication is given in a hospital or clinic. It will not be stored at home. NOTE: This sheet is a summary. It may not cover all possible information. If you have questions about this medicine, talk to your doctor, pharmacist, or health care provider.  2024 Elsevier/Gold Standard (2022-04-14 00:00:00)

## 2023-05-22 NOTE — Assessment & Plan Note (Signed)
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  He is here today for evaluation prior to receiving his first round of cisplatin chemoradiation.  His labs today are stable.  He will proceed with treatment.  He did want to clarify a few things about his diagnosis and understand better the lung nodule and reasons around not biopsying it.  Dr. Al Pimple did visit with Ethan [name] and his wife and daughter to review this and answer any questions they had.  He had a question about his thoracic aortic aneurysm as it has not been visualized on previous scans.  I called the radiology reading room and asked that they reach out to the reading radiologist so they can review and addend this report as necessary.  In a lot of this visit I reviewed and updated his care team's along with his oncology history.  He will return weekly for chemo.  He knows to call for any questions or concerns prior to his next visit with [place].

## 2023-05-23 ENCOUNTER — Ambulatory Visit
Admission: RE | Admit: 2023-05-23 | Discharge: 2023-05-23 | Disposition: A | Discharge status: Home / Self Care | Payer: Medicare Other | Source: Ambulatory Visit | Attending: Radiation Oncology | Admitting: Radiation Oncology

## 2023-05-23 ENCOUNTER — Other Ambulatory Visit: Payer: Self-pay

## 2023-05-23 ENCOUNTER — Telehealth: Payer: Self-pay

## 2023-05-23 DIAGNOSIS — C090 Malignant neoplasm of tonsillar fossa: Secondary | ICD-10-CM | POA: Diagnosis not present

## 2023-05-23 LAB — RAD ONC ARIA SESSION SUMMARY
Course Elapsed Days: 0
Plan Fractions Treated to Date: 1
Plan Prescribed Dose Per Fraction: 2 Gy
Plan Total Fractions Prescribed: 30
Plan Total Prescribed Dose: 60 Gy
Reference Point Dosage Given to Date: 2 Gy
Reference Point Session Dosage Given: 2 Gy
Session Number: 1

## 2023-05-23 NOTE — Telephone Encounter (Signed)
Pt stes that he is doing fairly well. He is forcing himself to eat. He is drinking fluids and urinating well. He is trying to take in 64 ounces today. He knows the office number [phone] to call if he has any questions or concerns.

## 2023-05-23 NOTE — Telephone Encounter (Signed)
-----   Message from Pauletta Browns, RN sent at 05/22/2023  4:10 PM EDT ----- Regarding: [place]- first chemo follow up Please follow up with patient. He received first time cisplatin 5/28. Radiation to start 5/29.

## 2023-05-24 ENCOUNTER — Other Ambulatory Visit: Payer: Self-pay

## 2023-05-24 ENCOUNTER — Ambulatory Visit
Admission: RE | Admit: 2023-05-24 | Discharge: 2023-05-24 | Disposition: A | Discharge status: Home / Self Care | Payer: Medicare Other | Source: Ambulatory Visit | Attending: Radiation Oncology | Admitting: Radiation Oncology

## 2023-05-24 ENCOUNTER — Encounter: Payer: Self-pay | Admitting: Hematology and Oncology

## 2023-05-24 DIAGNOSIS — C090 Malignant neoplasm of tonsillar fossa: Secondary | ICD-10-CM

## 2023-05-24 LAB — RAD ONC ARIA SESSION SUMMARY
Course Elapsed Days: 1
Plan Fractions Treated to Date: 2
Plan Prescribed Dose Per Fraction: 2 Gy
Plan Total Fractions Prescribed: 30
Plan Total Prescribed Dose: 60 Gy
Reference Point Dosage Given to Date: 4 Gy
Reference Point Session Dosage Given: 2 Gy
Session Number: 2

## 2023-05-25 ENCOUNTER — Other Ambulatory Visit: Payer: Self-pay | Admitting: Radiology

## 2023-05-25 ENCOUNTER — Ambulatory Visit
Admission: RE | Admit: 2023-05-25 | Discharge: 2023-05-25 | Disposition: A | Discharge status: Home / Self Care | Payer: Medicare Other | Source: Ambulatory Visit | Attending: Radiation Oncology | Admitting: Radiation Oncology

## 2023-05-25 ENCOUNTER — Other Ambulatory Visit: Payer: Self-pay

## 2023-05-25 DIAGNOSIS — C090 Malignant neoplasm of tonsillar fossa: Secondary | ICD-10-CM | POA: Diagnosis not present

## 2023-05-25 DIAGNOSIS — C760 Malignant neoplasm of head, face and neck: Secondary | ICD-10-CM

## 2023-05-25 LAB — RAD ONC ARIA SESSION SUMMARY
Course Elapsed Days: 2
Plan Fractions Treated to Date: 3
Plan Prescribed Dose Per Fraction: 2 Gy
Plan Total Fractions Prescribed: 30
Plan Total Prescribed Dose: 60 Gy
Reference Point Dosage Given to Date: 6 Gy
Reference Point Session Dosage Given: 2 Gy
Session Number: 3

## 2023-05-27 NOTE — Consult Note (Signed)
Chief Complaint: Patient was seen in consultation today for port a cath and percutaneous gastrostomy tube placements   Referring Physician(s): Iruku,Praveena  Supervising Physician: Pernell Dupre  Patient Status: [facility] - Out-pt  History of Present Illness: Ethan [name] is a 72 y.o. male with PMH sig for AAA with repair 2012, HLD, renal stones who presents now with newly diagnosed SCC base of tongue,status post radical tonsillectomy and right level 2A3 and 4 lymph node dissection 04/06/23 with final pathology showing 2.7 cm tumor size in greatest dimension, 8 out of 12 lymph nodes with carcinoma and extranodal extension present. He is scheduled today for port a cath and gastrostomy tube placements while undergoing chemoradiation.  Past Medical History:  Diagnosis Date   AAA (abdominal aortic aneurysm) (HCC)    Hyperlipidemia    Kidney stones    Renal disorder     Past Surgical History:  Procedure Laterality Date   ABDOMINAL AORTIC ANEURYSM REPAIR  06/15/2011   EVAR   CYSTOSCOPY WITH RETROGRADE PYELOGRAM, URETEROSCOPY AND STENT PLACEMENT Right 03/11/2015   Procedure: CYSTOSCOPY WITH RETROGRADE PYELOGRAM, AND RIGHT URETERAL STENT PLACEMENT;  Surgeon: Barron Alvine, MD;  Location: [facility];  Service: Urology;  Laterality: Right;   HERNIA REPAIR     LOWER BACK FUSION, L2-L3,AND L3-L4     RIGHT LOWER TIB-FEM FRACTURE     SHOULDER SURGERY Bilateral    SPINE SURGERY     THROAT SURGERY  04/06/2023    Allergies: Patient has no known allergies.  Medications: Prior to Admission medications   Medication Sig Start Date End Date Taking? Authorizing Provider  augmented betamethasone dipropionate (DIPROLENE-AF) 0.05 % cream APPLY TO RASH ON THE SKIN TWICE DAILY FOR 2 3 WEEKS. 10/01/18   [provider]  dexamethasone (DECADRON) 4 MG tablet Take 2 tablets (8 mg) by mouth daily x 3 days starting the day after cisplatin chemotherapy. Take with food. Patient not taking:  Reported on 05/22/2023 05/02/23   Rachel Moulds, MD  lidocaine-prilocaine (EMLA) cream Apply to affected area once Patient not taking: Reported on 05/22/2023 05/02/23   Rachel Moulds, MD  ondansetron (ZOFRAN) 8 MG tablet Take 1 tablet (8 mg total) by mouth every 8 (eight) hours as needed for nausea or vomiting. Start on the third day after cisplatin. Patient not taking: Reported on 05/22/2023 05/02/23   Rachel Moulds, MD  prochlorperazine (COMPAZINE) 10 MG tablet Take 1 tablet (10 mg total) by mouth every 6 (six) hours as needed (Nausea or vomiting). Patient not taking: Reported on 05/22/2023 05/02/23   Rachel Moulds, MD  tamsulosin [facility]) 0.4 MG CAPS capsule  11/11/18   [provider]     Family History  Problem Relation Age of Onset   Aneurysm Father    Stroke Mother    Heart disease Mother        Aneurysm   Hypertension Sister    Alcohol abuse Brother    Cancer Brother        mouth cancer   Aneurysm Paternal Uncle     Social History   Socioeconomic History   Marital status: Married    Spouse name: Not on file   Number of children: Not on file   Years of education: Not on file   Highest education level: Not on file  Occupational History   Not on file  Tobacco Use   Smoking status: Some Days    Packs/day: .5    Types: Cigarettes   Smokeless tobacco: Never  Vaping Use  Vaping Use: Never used  Substance and Sexual Activity   Alcohol use: Yes    Alcohol/week: 0.0 standard drinks of alcohol    Comment: occ   Drug use: No   Sexual activity: Not Currently  Other Topics Concern   Not on file  Social History Narrative   Not on file   Social Determinants of Health   Financial Resource Strain: Not on file  Food Insecurity: No Food Insecurity (04/26/2023)   Hunger Vital Sign    Worried About Running Out of Food in the Last Year: Never true    Ran Out of Food in the Last Year: Never true  Transportation Needs: No Transportation Needs (04/26/2023)   PRAPARE -  Administrator, Civil Service (Medical): No    Lack of Transportation (Non-Medical): No  Physical Activity: Not on file  Stress: Not on file  Social Connections: Not on file      Review of Systems  Vital Signs:   Code Status:   Advance Care Plan: {Advance Care ZOXW:[id number]}    Physical Exam  Imaging: CT Super D Chest Wo Contrast  Addendum Date: 05/22/2023   ADDENDUM REPORT: 05/22/2023 14:43 ADDENDUM: The original report was by Dr. Gaylyn Rong. The following addendum is by Dr. Gaylyn Rong: For my original exam, I compared to the CT images PET-CT from 03/12/2023. There is also an outside exam from [facility] dated 02/02/2023, I measure the ascending thoracic aortic aneurysm at 4.3 cm on that exam which is essentially stable from the current measurement. This can likely be surveilled in the context of the patient's surveillance oncology imaging. If the chest would not be imaged on surveillance oncology imaging, then I recommend annual imaging followup by CTA or MRA. This recommendation follows 2010 ACCF/AHA/AATS/ACR/ASA/SCA/SCAI/SIR/STS/SVM Guidelines for the Diagnosis and Management of Patients with Thoracic Aortic Disease. Circulation. 2010; 121: W098-J191. Aortic aneurysm NOS (ICD10-I71.9) Electronically Signed   By: Gaylyn Rong M.D.   On: 05/22/2023 14:43   Result Date: 05/22/2023 CLINICAL DATA:  Tonsillar cancer.  Lung nodule. EXAM: CT CHEST WITHOUT CONTRAST TECHNIQUE: Multidetector CT imaging of the chest was performed using thin slice collimation for electromagnetic bronchoscopy planning purposes, without intravenous contrast. RADIATION DOSE REDUCTION: This exam was performed according to the departmental dose-optimization program which includes automated exposure control, adjustment of the mA and/or kV according to patient size and/or use of iterative reconstruction technique. COMPARISON:  PET-CT 03/12/2023 FINDINGS: Cardiovascular: Coronary, aortic  arch, and branch vessel atherosclerotic vascular disease. Ascending thoracic aortic aneurysm 4.2 cm in diameter. Mediastinum/Nodes: 1.0 cm right lower paratracheal node, image 76 series 2. Lungs/Pleura: Previously hypermetabolic and cavitary right upper lobe nodule 1.4 by 0.8 cm on image 53 series 4, previously 1.3 by 0.8 cm by my measurement on 03/12/2023. Prominent centrilobular emphysema. Upper Abdomen: Hepatic and left kidney upper pole cysts. Musculoskeletal: Unremarkable IMPRESSION: 1. Previously hypermetabolic and cavitary right upper lobe nodule is similar in size to the prior PET-CT from 03/12/2023. 2. Borderline enlarged right lower paratracheal lymph node. 3. Aortic atherosclerosis. Ascending thoracic aortic aneurysm 4.2 cm in diameter. This can likely be followed in the context of the patient's anticipated follow up thoracic oncology imaging. 4. Emphysema. Aortic Atherosclerosis (ICD10-I70.0) and Emphysema (ICD10-J43.9). Electronically Signed: By: Gaylyn Rong M.D. On: 05/18/2023 16:34    Labs:  CBC: Recent Labs    03/06/23 1255 05/22/23 0803  WBC 8.4 6.5  HGB 17.1* 15.2  HCT 48.7 42.9  PLT 198 163    COAGS: No results  for input(s): "INR", "APTT" in the last 8760 hours.  BMP: Recent Labs    03/06/23 1255 05/14/23 1230 05/22/23 0803  NA 141  --  138  K 4.2  --  3.6  CL 107  --  108  CO2 29  --  23  GLUCOSE 93  --  123*  BUN 19 20 16   CALCIUM 9.3  --  8.5*  CREATININE 1.07 1.11 0.97  GFRNONAA >60 >60 >60    LIVER FUNCTION TESTS: Recent Labs    03/06/23 1255  BILITOT 0.5  AST 13*  ALT 8  ALKPHOS 63  PROT 7.2  ALBUMIN 4.3    TUMOR MARKERS: No results for input(s): "AFPTM", "CEA", "CA199", "CHROMGRNA" in the last 8760 hours.  Assessment and Plan: 72 y.o. male with PMH sig for AAA with repair 2012, HLD, renal stones who presents now with newly diagnosed SCC base of tongue,status post radical tonsillectomy and right level 2A3 and 4 lymph node dissection  04/06/23 with final pathology showing 2.7 cm tumor size in greatest dimension, 8 out of 12 lymph nodes with carcinoma and extranodal extension present. He is scheduled today for port a cath and gastrostomy tube placements while undergoing chemoradiation.Risks and benefits image guided port a cath and gastrostomy tube placements were discussed with the patient including, but not limited to the need for a barium enema during the procedure, bleeding, infection, peritonitis, venous thrombosis, pneumothorax and/or damage to adjacent structures.  All of the patient's questions were answered, patient is agreeable to proceed.  Consent signed and in chart.    Thank you for this interesting consult.  I greatly enjoyed meeting Ethan [name] and look forward to participating in their care.  A copy of this report was sent to the requesting provider on this date.  Electronically Signed: D. Jeananne Rama, PA-C 05/27/2023, 3:53 PM   I spent a total of  30 minutes  in face to face in clinical consultation, greater than 50% of which was counseling/coordinating care for port a cath and gastrostomy tube placements

## 2023-05-28 ENCOUNTER — Ambulatory Visit: Payer: Medicare Other

## 2023-05-28 ENCOUNTER — Ambulatory Visit (HOSPITAL_COMMUNITY)
Admission: RE | Admit: 2023-05-28 | Discharge: 2023-05-28 | Disposition: A | Discharge status: Home / Self Care | Payer: Medicare Other | Source: Ambulatory Visit | Attending: Hematology and Oncology | Admitting: Hematology and Oncology

## 2023-05-28 ENCOUNTER — Other Ambulatory Visit: Payer: Self-pay | Admitting: Hematology and Oncology

## 2023-05-28 ENCOUNTER — Encounter (HOSPITAL_COMMUNITY): Payer: Self-pay

## 2023-05-28 DIAGNOSIS — C090 Malignant neoplasm of tonsillar fossa: Secondary | ICD-10-CM | POA: Insufficient documentation

## 2023-05-28 DIAGNOSIS — E785 Hyperlipidemia, unspecified: Secondary | ICD-10-CM | POA: Insufficient documentation

## 2023-05-28 DIAGNOSIS — Z923 Personal history of irradiation: Secondary | ICD-10-CM | POA: Insufficient documentation

## 2023-05-28 DIAGNOSIS — F17210 Nicotine dependence, cigarettes, uncomplicated: Secondary | ICD-10-CM | POA: Diagnosis not present

## 2023-05-28 DIAGNOSIS — Z9221 Personal history of antineoplastic chemotherapy: Secondary | ICD-10-CM | POA: Insufficient documentation

## 2023-05-28 DIAGNOSIS — C760 Malignant neoplasm of head, face and neck: Secondary | ICD-10-CM

## 2023-05-28 DIAGNOSIS — Z8679 Personal history of other diseases of the circulatory system: Secondary | ICD-10-CM | POA: Diagnosis not present

## 2023-05-28 HISTORY — PX: IR IMAGING GUIDED PORT INSERTION: IMG5740

## 2023-05-28 HISTORY — PX: IR GASTROSTOMY TUBE MOD SED: IMG625

## 2023-05-28 HISTORY — PX: IR NASO G TUBE PLC W/FL-NO RAD: IMG2322

## 2023-05-28 LAB — CBC WITH DIFFERENTIAL/PLATELET
Abs Immature Granulocytes: 0.07 10*3/uL (ref 0.00–0.07)
Basophils Absolute: 0 10*3/uL (ref 0.0–0.1)
Basophils Relative: 0 %
Eosinophils Absolute: 0.1 10*3/uL (ref 0.0–0.5)
Eosinophils Relative: 1 %
HCT: 43.4 % (ref 39.0–52.0)
Hemoglobin: 14.8 g/dL (ref 13.0–17.0)
Immature Granulocytes: 1 %
Lymphocytes Relative: 9 %
Lymphs Abs: 0.8 10*3/uL (ref 0.7–4.0)
MCH: 33.9 pg (ref 26.0–34.0)
MCHC: 34.1 g/dL (ref 30.0–36.0)
MCV: 99.3 fL (ref 80.0–100.0)
Monocytes Absolute: 0.6 10*3/uL (ref 0.1–1.0)
Monocytes Relative: 7 %
Neutro Abs: 7.6 10*3/uL (ref 1.7–7.7)
Neutrophils Relative %: 82 %
Platelets: 158 10*3/uL (ref 150–400)
RBC: 4.37 MIL/uL (ref 4.22–5.81)
RDW: 12.4 % (ref 11.5–15.5)
WBC: 9.2 10*3/uL (ref 4.0–10.5)
nRBC: 0 % (ref 0.0–0.2)

## 2023-05-28 LAB — BASIC METABOLIC PANEL
Anion gap: 6 (ref 5–15)
BUN: 25 mg/dL — ABNORMAL HIGH (ref 8–23)
CO2: 27 mmol/L (ref 22–32)
Calcium: 8.9 mg/dL (ref 8.9–10.3)
Chloride: 104 mmol/L (ref 98–111)
Creatinine, Ser: 1.19 mg/dL (ref 0.61–1.24)
GFR, Estimated: >60 mL/min (ref >60)
Glucose, Bld: 90 mg/dL (ref 70–99)
Potassium: 3.7 mmol/L (ref 3.5–5.1)
Sodium: 137 mmol/L (ref 135–145)

## 2023-05-28 LAB — PROTIME-INR
INR: 1.1 (ref 0.8–1.2)
Prothrombin Time: 14.2 seconds (ref 11.4–15.2)

## 2023-05-28 MED ORDER — HEPARIN SOD (PORK) LOCK FLUSH 100 UNIT/ML IV SOLN
INTRAVENOUS | Status: AC
  Filled 2023-05-28: qty 5

## 2023-05-28 MED ORDER — MIDAZOLAM HCL 2 MG/2ML IJ SOLN
INTRAMUSCULAR | Status: AC
  Filled 2023-05-28: qty 4

## 2023-05-28 MED ORDER — GLUCAGON HCL RDNA (DIAGNOSTIC) 1 MG IJ SOLR
INTRAMUSCULAR | Status: AC | PRN
  Administered 2023-05-28: 1 mg via INTRAVENOUS

## 2023-05-28 MED ORDER — NALOXONE HCL 0.4 MG/ML IJ SOLN
INTRAMUSCULAR | Status: AC
  Filled 2023-05-28: qty 1

## 2023-05-28 MED ORDER — FENTANYL CITRATE (PF) 100 MCG/2ML IJ SOLN
INTRAMUSCULAR | Status: AC
  Filled 2023-05-28: qty 2

## 2023-05-28 MED ORDER — GLUCAGON HCL RDNA (DIAGNOSTIC) 1 MG IJ SOLR
INTRAMUSCULAR | Status: AC
  Filled 2023-05-28: qty 1

## 2023-05-28 MED ORDER — MIDAZOLAM HCL 2 MG/2ML IJ SOLN
INTRAMUSCULAR | Status: AC | PRN
  Administered 2023-05-28 (×4): 1 mg via INTRAVENOUS

## 2023-05-28 MED ORDER — LIDOCAINE-EPINEPHRINE 1 %-1:100000 IJ SOLN
INTRAMUSCULAR | Status: AC
  Filled 2023-05-28: qty 2

## 2023-05-28 MED ORDER — HEPARIN SOD (PORK) LOCK FLUSH 100 UNIT/ML IV SOLN
500.0000 [IU] | Freq: Once | INTRAVENOUS | Status: AC
  Administered 2023-05-28: 500 [IU] via INTRAVENOUS

## 2023-05-28 MED ORDER — CEFAZOLIN SODIUM-DEXTROSE 2-4 GM/100ML-% IV SOLN
INTRAVENOUS | Status: AC
  Filled 2023-05-28: qty 100

## 2023-05-28 MED ORDER — FENTANYL CITRATE (PF) 100 MCG/2ML IJ SOLN
INTRAMUSCULAR | Status: AC | PRN
  Administered 2023-05-28 (×4): 50 ug via INTRAVENOUS

## 2023-05-28 MED ORDER — DIPHENHYDRAMINE HCL 50 MG/ML IJ SOLN
INTRAMUSCULAR | Status: AC
  Filled 2023-05-28: qty 1

## 2023-05-28 MED ORDER — CEFAZOLIN SODIUM-DEXTROSE 2-4 GM/100ML-% IV SOLN
2.0000 g | INTRAVENOUS | Status: AC
  Administered 2023-05-28: 2 g via INTRAVENOUS
  Filled 2023-05-28: qty 100

## 2023-05-28 MED ORDER — IOHEXOL 300 MG/ML  SOLN
50.0000 mL | Freq: Once | INTRAMUSCULAR | Status: AC | PRN
  Administered 2023-05-28: 30 mL

## 2023-05-28 MED ORDER — LIDOCAINE VISCOUS HCL 2 % MT SOLN
OROMUCOSAL | Status: AC
  Filled 2023-05-28: qty 15

## 2023-05-28 MED ORDER — FLUMAZENIL 0.5 MG/5ML IV SOLN
INTRAVENOUS | Status: AC
  Filled 2023-05-28: qty 5

## 2023-05-28 MED ORDER — SODIUM CHLORIDE 0.9 % IV SOLN: INTRAVENOUS | Status: DC

## 2023-05-28 MED ORDER — FENTANYL CITRATE (PF) 100 MCG/2ML IJ SOLN
INTRAMUSCULAR | Status: AC
  Filled 2023-05-28: qty 4

## 2023-05-28 MED ORDER — DIPHENHYDRAMINE HCL 50 MG/ML IJ SOLN
INTRAMUSCULAR | Status: AC | PRN
  Administered 2023-05-28: 25 mg via INTRAVENOUS

## 2023-05-28 MED ORDER — LIDOCAINE-EPINEPHRINE 1 %-1:100000 IJ SOLN
20.0000 mL | Freq: Once | INTRAMUSCULAR | Status: AC
  Administered 2023-05-28: 15 mL via INTRADERMAL

## 2023-05-28 MED ORDER — LIDOCAINE-EPINEPHRINE 1 %-1:100000 IJ SOLN
20.0000 mL | Freq: Once | INTRAMUSCULAR | Status: AC
  Administered 2023-05-28: 13 mL via INTRADERMAL

## 2023-05-28 MED ORDER — LIDOCAINE VISCOUS HCL 2 % MT SOLN
15.0000 mL | OROMUCOSAL | Status: DC | PRN
  Administered 2023-05-28: 8 mL via OROMUCOSAL

## 2023-05-28 MED FILL — Fosaprepitant Dimeglumine For IV Infusion 150 MG (Base Eq): INTRAVENOUS | Qty: 5 | Status: AC

## 2023-05-28 MED FILL — Dexamethasone Sodium Phosphate Inj 100 MG/10ML: INTRAMUSCULAR | Qty: 1 | Status: AC

## 2023-05-28 NOTE — Sedation Documentation (Signed)
Gastrostomy tube started

## 2023-05-28 NOTE — Sedation Documentation (Signed)
Port a cath end.

## 2023-05-28 NOTE — Discharge Instructions (Addendum)

## 2023-05-28 NOTE — Procedures (Signed)
Interventional Radiology Procedure Note  Date of Procedure: 05/28/2023  Procedure: Port placement, G tube placement   Findings:  1. Right chest port placement  2. G tube placement, 20 Fr balloon retention    Complications: No immediate complications noted.   Estimated Blood Loss: minimal  Follow-up and Recommendations: 1. Port ready for immediate use  2. G tube may be used after 4 hours    Olive Bass, MD  Vascular & Interventional Radiology  05/28/2023 11:17 AM

## 2023-05-29 ENCOUNTER — Inpatient Hospital Stay: Payer: Medicare Other

## 2023-05-29 ENCOUNTER — Ambulatory Visit
Admission: RE | Admit: 2023-05-29 | Discharge: 2023-05-29 | Disposition: A | Discharge status: Home / Self Care | Payer: Medicare Other | Source: Ambulatory Visit | Attending: Hematology and Oncology | Admitting: Hematology and Oncology

## 2023-05-29 ENCOUNTER — Ambulatory Visit
Admission: RE | Admit: 2023-05-29 | Discharge: 2023-05-29 | Disposition: A | Discharge status: Home / Self Care | Payer: Medicare Other | Source: Ambulatory Visit | Attending: Radiation Oncology | Admitting: Radiation Oncology

## 2023-05-29 ENCOUNTER — Inpatient Hospital Stay: Payer: Medicare Other | Admitting: Licensed Clinical Social Worker

## 2023-05-29 ENCOUNTER — Inpatient Hospital Stay: Payer: Medicare Other | Admitting: Dietician

## 2023-05-29 ENCOUNTER — Other Ambulatory Visit: Payer: Self-pay

## 2023-05-29 ENCOUNTER — Inpatient Hospital Stay: Payer: Medicare Other | Admitting: Hematology and Oncology

## 2023-05-29 VITALS — BP 143/74 | HR 72 | Resp 16

## 2023-05-29 VITALS — BP 135/69 | HR 72 | Temp 97.2°F | Resp 18 | Wt 194.3 lb

## 2023-05-29 DIAGNOSIS — R5383 Other fatigue: Secondary | ICD-10-CM | POA: Insufficient documentation

## 2023-05-29 DIAGNOSIS — F17210 Nicotine dependence, cigarettes, uncomplicated: Secondary | ICD-10-CM | POA: Insufficient documentation

## 2023-05-29 DIAGNOSIS — Z931 Gastrostomy status: Secondary | ICD-10-CM | POA: Insufficient documentation

## 2023-05-29 DIAGNOSIS — K5900 Constipation, unspecified: Secondary | ICD-10-CM | POA: Insufficient documentation

## 2023-05-29 DIAGNOSIS — C090 Malignant neoplasm of tonsillar fossa: Secondary | ICD-10-CM | POA: Insufficient documentation

## 2023-05-29 DIAGNOSIS — R232 Flushing: Secondary | ICD-10-CM | POA: Insufficient documentation

## 2023-05-29 DIAGNOSIS — Z5111 Encounter for antineoplastic chemotherapy: Secondary | ICD-10-CM | POA: Insufficient documentation

## 2023-05-29 DIAGNOSIS — C01 Malignant neoplasm of base of tongue: Secondary | ICD-10-CM | POA: Insufficient documentation

## 2023-05-29 DIAGNOSIS — Z95828 Presence of other vascular implants and grafts: Secondary | ICD-10-CM

## 2023-05-29 DIAGNOSIS — R911 Solitary pulmonary nodule: Secondary | ICD-10-CM

## 2023-05-29 DIAGNOSIS — R110 Nausea: Secondary | ICD-10-CM

## 2023-05-29 DIAGNOSIS — T85848A Pain due to other internal prosthetic devices, implants and grafts, initial encounter: Secondary | ICD-10-CM

## 2023-05-29 DIAGNOSIS — R350 Frequency of micturition: Secondary | ICD-10-CM | POA: Diagnosis not present

## 2023-05-29 LAB — CBC WITH DIFFERENTIAL (CANCER CENTER ONLY)
Abs Immature Granulocytes: 0.08 10*3/uL — ABNORMAL HIGH (ref 0.00–0.07)
Basophils Absolute: 0 10*3/uL (ref 0.0–0.1)
Basophils Relative: 0 %
Eosinophils Absolute: 0.1 10*3/uL (ref 0.0–0.5)
Eosinophils Relative: 1 %
HCT: 42.1 % (ref 39.0–52.0)
Hemoglobin: 14.8 g/dL (ref 13.0–17.0)
Immature Granulocytes: 1 %
Lymphocytes Relative: 9 %
Lymphs Abs: 0.7 10*3/uL (ref 0.7–4.0)
MCH: 34 pg (ref 26.0–34.0)
MCHC: 35.2 g/dL (ref 30.0–36.0)
MCV: 96.8 fL (ref 80.0–100.0)
Monocytes Absolute: 0.5 10*3/uL (ref 0.1–1.0)
Monocytes Relative: 6 %
Neutro Abs: 7 10*3/uL (ref 1.7–7.7)
Neutrophils Relative %: 83 %
Platelet Count: 181 10*3/uL (ref 150–400)
RBC: 4.35 MIL/uL (ref 4.22–5.81)
RDW: 12.1 % (ref 11.5–15.5)
WBC Count: 8.4 10*3/uL (ref 4.0–10.5)
nRBC: 0 % (ref 0.0–0.2)

## 2023-05-29 LAB — BASIC METABOLIC PANEL - CANCER CENTER ONLY
Anion gap: 4 — ABNORMAL LOW (ref 5–15)
BUN: 20 mg/dL (ref 8–23)
CO2: 27 mmol/L (ref 22–32)
Calcium: 8.6 mg/dL — ABNORMAL LOW (ref 8.9–10.3)
Chloride: 105 mmol/L (ref 98–111)
Creatinine: 0.9 mg/dL (ref 0.61–1.24)
GFR, Estimated: >60 mL/min (ref >60)
Glucose, Bld: 100 mg/dL — ABNORMAL HIGH (ref 70–99)
Potassium: 3.8 mmol/L (ref 3.5–5.1)
Sodium: 136 mmol/L (ref 135–145)

## 2023-05-29 LAB — RAD ONC ARIA SESSION SUMMARY
Course Elapsed Days: 6
Plan Fractions Treated to Date: 4
Plan Prescribed Dose Per Fraction: 2 Gy
Plan Total Fractions Prescribed: 30
Plan Total Prescribed Dose: 60 Gy
Reference Point Dosage Given to Date: 8 Gy
Reference Point Session Dosage Given: 2 Gy
Session Number: 4

## 2023-05-29 LAB — MAGNESIUM: Magnesium: 2 mg/dL (ref 1.7–2.4)

## 2023-05-29 MED ORDER — SODIUM CHLORIDE 0.9 % IV SOLN
40.0000 mg/m2 | Freq: Once | INTRAVENOUS | Status: AC
  Administered 2023-05-29: 86 mg via INTRAVENOUS
  Filled 2023-05-29: qty 86

## 2023-05-29 MED ORDER — SODIUM CHLORIDE 0.9% FLUSH
10.0000 mL | Freq: Once | INTRAVENOUS | Status: AC
  Administered 2023-05-29: 10 mL

## 2023-05-29 MED ORDER — SODIUM CHLORIDE 0.9 % IV SOLN
10.0000 mg | Freq: Once | INTRAVENOUS | Status: AC
  Administered 2023-05-29: 10 mg via INTRAVENOUS
  Filled 2023-05-29: qty 10

## 2023-05-29 MED ORDER — SODIUM CHLORIDE 0.9 % IV SOLN
150.0000 mg | Freq: Once | INTRAVENOUS | Status: AC
  Administered 2023-05-29: 150 mg via INTRAVENOUS
  Filled 2023-05-29: qty 150

## 2023-05-29 MED ORDER — SODIUM CHLORIDE 0.9% FLUSH
10.0000 mL | INTRAVENOUS | Status: DC | PRN
  Administered 2023-05-29: 10 mL

## 2023-05-29 MED ORDER — POTASSIUM CHLORIDE IN NACL 20-0.9 MEQ/L-% IV SOLN
Freq: Once | INTRAVENOUS | Status: AC
  Filled 2023-05-29: qty 1000

## 2023-05-29 MED ORDER — HEPARIN SOD (PORK) LOCK FLUSH 100 UNIT/ML IV SOLN
500.0000 [IU] | Freq: Once | INTRAVENOUS | Status: AC | PRN
  Administered 2023-05-29: 500 [IU]

## 2023-05-29 MED ORDER — SODIUM CHLORIDE 0.9 % IV SOLN: Freq: Once | INTRAVENOUS | Status: AC

## 2023-05-29 MED ORDER — OXYCODONE HCL 5 MG PO TABS: 5.0000 mg | ORAL_TABLET | Freq: Two times a day (BID) | ORAL | 0 refills | Status: DC | PRN

## 2023-05-29 MED ORDER — PALONOSETRON HCL INJECTION 0.25 MG/5ML
0.2500 mg | Freq: Once | INTRAVENOUS | Status: AC
  Administered 2023-05-29: 0.25 mg via INTRAVENOUS
  Filled 2023-05-29: qty 5

## 2023-05-29 MED ORDER — MAGNESIUM SULFATE 2 GM/50ML IV SOLN
2.0000 g | Freq: Once | INTRAVENOUS | Status: AC
  Administered 2023-05-29: 2 g via INTRAVENOUS
  Filled 2023-05-29: qty 50

## 2023-05-29 NOTE — Progress Notes (Signed)
[facility] Clinical Social Work  Clinical Social Work was referred by Statistician for assessment of psychosocial needs.  Clinical Social Worker met with patient and wife, Selena Batten, to offer support and assess for needs in infusion room.    CSW and patient discussed the importance of support during treatment. Pt and wife report excellent support from their 3 daughters, sons in law, and grandkids who all live nearby. They denied any SDOH needs.  CSW informed patient of the support team and support services at [facility].  CSW provided contact information and encouraged patient to call with any questions or concerns.     Mysha Peeler E Daneshia Tavano, LCSW  Clinical Social Worker [facility]

## 2023-05-29 NOTE — Patient Instructions (Signed)
[facility]  Discharge Instructions: Thank you for choosing [facility] to provide your oncology and hematology care.   If you have a lab appointment with the Cancer Center, please go directly to the Cancer Center and check in at the registration area.   Wear comfortable clothing and clothing appropriate for easy access to any Portacath or PICC line.   We strive to give you quality time with your provider. You may need to reschedule your appointment if you arrive late (15 or more minutes).  Arriving late affects you and other patients whose appointments are after yours.  Also, if you miss three or more appointments without notifying the office, you may be dismissed from the clinic at the provider's discretion.      For prescription refill requests, have your pharmacy contact our office and allow 72 hours for refills to be completed.    Today you received the following chemotherapy and/or immunotherapy agents: Cisplatin      To help prevent nausea and vomiting after your treatment, we encourage you to take your nausea medication as directed.  BELOW ARE SYMPTOMS THAT SHOULD BE REPORTED IMMEDIATELY: *FEVER GREATER THAN 100.4 F (38 C) OR HIGHER *CHILLS OR SWEATING *NAUSEA AND VOMITING THAT IS NOT CONTROLLED WITH YOUR NAUSEA MEDICATION *UNUSUAL SHORTNESS OF BREATH *UNUSUAL BRUISING OR BLEEDING *URINARY PROBLEMS (pain or burning when urinating, or frequent urination) *BOWEL PROBLEMS (unusual diarrhea, constipation, pain near the anus) TENDERNESS IN MOUTH AND THROAT WITH OR WITHOUT PRESENCE OF ULCERS (sore throat, sores in mouth, or a toothache) UNUSUAL RASH, SWELLING OR PAIN  UNUSUAL VAGINAL DISCHARGE OR ITCHING   Items with * indicate a potential emergency and should be followed up as soon as possible or go to the Emergency Department if any problems should occur.  Please show the CHEMOTHERAPY ALERT CARD or IMMUNOTHERAPY ALERT CARD at  check-in to the Emergency Department and triage nurse.  Should you have questions after your visit or need to cancel or reschedule your appointment, please contact [facility]  Dept: [phone]  and follow the prompts.  Office hours are 8:00 a.m. to 4:30 p.m. Monday - Friday. Please note that voicemails left after 4:00 p.m. may not be returned until the following business day.  We are closed weekends and major holidays. You have access to a nurse at all times for urgent questions. Please call the main number to the clinic Dept: [phone] and follow the prompts.   For any non-urgent questions, you may also contact your provider using MyChart. We now offer e-Visits for anyone 18 and older to request care online for non-urgent symptoms. For details visit [email or web address].   Also download the MyChart app! Go to the app store, search "MyChart", open the app, select [place], and log in with your MyChart username and password.   Cisplatin Injection What is this medication? CISPLATIN (SIS pla tin) treats some types of cancer. It works by slowing down the growth of cancer cells. This medicine may be used for other purposes; ask your health care provider or pharmacist if you have questions. COMMON BRAND NAME(S): Platinol, Platinol -AQ What should I tell my care team before I take this medication? They need to know if you have any of these conditions: Eye disease, vision problems Hearing problems Kidney disease Low blood counts, such as low white cells, platelets, or red blood cells Tingling of the fingers or toes, or other nerve disorder An   unusual or allergic reaction to cisplatin, carboplatin, oxaliplatin, other medications, foods, dyes, or preservatives If you or your partner are pregnant or trying to get pregnant Breast-feeding How should I use this medication? This medication is injected into a vein. It is given by your care team in a hospital  or clinic setting. Talk to your care team about the use of this medication in children. Special care may be needed. Overdosage: If you think you have taken too much of this medicine contact a poison control center or emergency room at once. NOTE: This medicine is only for you. Do not share this medicine with others. What if I miss a dose? Keep appointments for follow-up doses. It is important not to miss your dose. Call your care team if you are unable to keep an appointment. What may interact with this medication? Do not take this medication with any of the following: Live virus vaccines This medication may also interact with the following: Certain antibiotics, such as amikacin, gentamicin, neomycin, polymyxin B, streptomycin, tobramycin, vancomycin Foscarnet This list may not describe all possible interactions. Give your health care provider a list of all the medicines, herbs, non-prescription drugs, or dietary supplements you use. Also tell them if you smoke, drink alcohol, or use illegal drugs. Some items may interact with your medicine. What should I watch for while using this medication? Your condition will be monitored carefully while you are receiving this medication. You may need blood work done while taking this medication. This medication may make you feel generally unwell. This is not uncommon, as chemotherapy can affect healthy cells as well as cancer cells. Report any side effects. Continue your course of treatment even though you feel ill unless your care team tells you to stop. This medication may increase your risk of getting an infection. Call your care team for advice if you get a fever, chills, sore throat, or other symptoms of a cold or flu. Do not treat yourself. Try to avoid being around people who are sick. Avoid taking medications that contain aspirin, acetaminophen, ibuprofen, naproxen, or ketoprofen unless instructed by your care team. These medications may hide a  fever. This medication may increase your risk to bruise or bleed. Call your care team if you notice any unusual bleeding. Be careful brushing or flossing your teeth or using a toothpick because you may get an infection or bleed more easily. If you have any dental work done, tell your dentist you are receiving this medication. Drink fluids as directed while you are taking this medication. This will help protect your kidneys. Call your care team if you get diarrhea. Do not treat yourself. Talk to your care team if you or your partner wish to become pregnant or think you might be pregnant. This medication can cause serious birth defects if taken during pregnancy and for 14 months after the last dose. A negative pregnancy test is required before starting this medication. A reliable form of contraception is recommended while taking this medication and for 14 months after the last dose. Talk to your care team about effective forms of contraception. Do not father a child while taking this medication and for 11 months after the last dose. Use a condom during sex during this time period. Do not breast-feed while taking this medication. This medication may cause infertility. Talk to your care team if you are concerned about your fertility. What side effects may I notice from receiving this medication? Side effects that you should report to your   care team as soon as possible: Allergic reactions--skin rash, itching, hives, swelling of the face, lips, tongue, or throat Eye pain, change in vision, vision loss Hearing loss, ringing in ears Infection--fever, chills, cough, sore throat, wounds that don't heal, pain or trouble when passing urine, general feeling of discomfort or being unwell Kidney injury--decrease in the amount of urine, swelling of the ankles, hands, or feet Low red blood cell level--unusual weakness or fatigue, dizziness, headache, trouble breathing Painful swelling, warmth, or redness of the skin,  blisters or sores at the infusion site Pain, tingling, or numbness in the hands or feet Unusual bruising or bleeding Side effects that usually do not require medical attention (report to your care team if they continue or are bothersome): Hair loss Nausea Vomiting This list may not describe all possible side effects. Call your doctor for medical advice about side effects. You may report side effects to FDA at 1-800-FDA-1088. Where should I keep my medication? This medication is given in a hospital or clinic. It will not be stored at home. NOTE: This sheet is a summary. It may not cover all possible information. If you have questions about this medicine, talk to your doctor, pharmacist, or health care provider.  2024 Elsevier/Gold Standard (2022-04-14 00:00:00)  

## 2023-05-29 NOTE — Progress Notes (Signed)
Nutrition Follow-up:  with SCC of right tonsil, p16 positive s/p TORS resection under the care of Dr. Hezzie Bump 04/06/23. He is receiving concurrent chemoradiation with weekly cisplatin (first 5/28).   S/p PEG 6/3  Met with patient in infusion. Wife is present at visit today. Patient reports significant pain overnight s/p PEG. He says he is having "lots of discomfort" today. Patient reports mild nausea as well as a few days of fatigue after first chemotherapy. His stools were loose for a couple days, "but not bad." Patient liked Boost VHC samples provided last week. Wife states she has been unable to find these. Patient denies sore throat, thick saliva/dry mouth. Reports his breakfast tasted burnt as well as the chocolate ice cream eaten earlier in infusion. He has not started doing baking soda salt water rinses.    Medications: reviewed  Labs: reviewed   Anthropometrics: Wt 194 lb 4.8 oz today decreased from 196 lb 3.2 oz on 5/28   Estimated Energy Needs  Kcals: 2300-2600 Protein: 107-130 Fluid: >/=2.3 L  NUTRITION DIAGNOSIS: Predicted suboptimal intake continues     INTERVENTION:  Reinforced importance of increased calorie and protein energy intake to maintain strength/weights Discussed strategies for altered taste. Encouraged pt to start baking soda salt water rinses several times daily and before meals - pt has handout Continue drinking Boost VHC/equivalent daily - additional samples + coupons provided Continue daily water flushes    MONITORING, EVALUATION, GOAL: weight trends, intake   NEXT VISIT: Tuesday June 11 during infusion

## 2023-05-29 NOTE — Progress Notes (Signed)
Oncology Nurse Navigator Documentation   I went to visit Ethan [name] in infusion during his chemotherapy to provide post insertion PEG education. I changed the dressing to the PEG site which had dried blood present form yesterday's procedure, the site was clean, dry, and intact. I demonstrated to him and his wife how to clamp the tube and flush and instructed them to do both daily. He will be seen by nutrition today but also knows to call me for any questions or concerns.   Hedda Slade RN, BSN, OCN Head & Neck Oncology Nurse Navigator  [facility] Phone # [phone]  Fax # [phone]

## 2023-05-29 NOTE — Progress Notes (Signed)
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 We have discussed about adjuvant chemoradiation when we last met him. Recently we had a conversation that the RUL nodule is persistent and cavitary and hence is of concern that it could be malignant but Dr Basilio Cairo feels it could be primary lung and slow growing overall, so we should proceed with Chemoradiation and follow up on lung nodule especially since there is no significant growth on the most recent scans. He is now post first weekly cycle of cisplatin. He says last week was rough, had port and G tube placed. He had mild nausea, no vomiting. No concerns on exam today. Labs satisfactory. Will send a refill for oxycodone for pain management.  Encouraged adequate nutrition. Ok to proceed with treatment today. RTC in one week.  HISTORY OF PRESENTING ILLNESS:  Ethan [name] 72 y.o. male is here because of SCC tongue, P16 positive.  This is a very pleasant 72 year old male patient with past medical history significant for abdominal aortic aneurysm status postrepair now diagnosed with  squamous cell carcinoma of the base of the tongue at diagnosis had T1 N1 M0 status post definitive surgery had radical tonsillectomy right neck lymph node dissection with final pathology showing tumor size of 2.7 cm nonkeratinizing squamous cell carcinoma with focal maturation, HPV associated, lymphovascular invasion and perineural invasion identified, negative surgical margins, 8 out of 12 lymph nodes with carcinoma and the largest tumor deposit was 2.6 cm in greatest dimension extranodal extension present referred to medical oncology for adjuvant chemotherapy.  At baseline he is extremely healthy, very active, eats healthy and denies any major medical issues.  Initially when he saw Dr. Basilio Cairo before surgery, he was discussed the option for concurrent chemoradiation versus surgery but he chose to proceed with surgery. When I last saw him, we discussed about concurrent CRT. He also has a slow growing RUL which is likely a second primary lung malignancy. Group discussion was to continue with planned chemo RT and evaluate this RUL nodule after completing adjuvant chemotherapy.  He is here after his first week of chemotherapy. He has been very fatigued. He had hisn G tube and port placed. He complains of nausea, no vomiting. He felt a bit of hot flash the day after chemo. No diarrhea. No change in hearing. No neuropathy.  MEDICAL HISTORY:  Past Medical History:  Diagnosis Date   AAA (abdominal aortic aneurysm) (HCC)    Hyperlipidemia    Kidney stones    Renal disorder     SURGICAL HISTORY: Past Surgical History:  Procedure Laterality Date   ABDOMINAL AORTIC ANEURYSM REPAIR  06/15/2011   EVAR   CYSTOSCOPY WITH RETROGRADE PYELOGRAM, URETEROSCOPY AND STENT PLACEMENT Right 03/11/2015   Procedure: CYSTOSCOPY WITH  RETROGRADE PYELOGRAM, AND RIGHT URETERAL STENT PLACEMENT;  Surgeon: Barron Alvine, MD;  Location: [facility];  Service: Urology;  Laterality: Right;   HERNIA REPAIR     IR GASTROSTOMY TUBE MOD SED   05/28/2023   IR IMAGING GUIDED PORT INSERTION  05/28/2023   LOWER BACK FUSION, L2-L3,AND L3-L4     RIGHT LOWER TIB-FEM FRACTURE     SHOULDER SURGERY Bilateral    SPINE SURGERY     THROAT SURGERY  04/06/2023    SOCIAL HISTORY: Social History   Socioeconomic History   Marital status: Married    Spouse name: Not on file   Number of children: Not on file   Years of education: Not on file   Highest education level: Not on file  Occupational History   Not on file  Tobacco Use   Smoking status: Some Days    Packs/day: .5    Types: Cigarettes   Smokeless tobacco: Never  Vaping Use   Vaping Use: Never used  Substance and Sexual Activity   Alcohol use: Yes    Alcohol/week: 0.0 standard drinks of alcohol    Comment: occ   Drug use: No   Sexual activity: Not Currently  Other Topics Concern   Not on file  Social History Narrative   Not on file   Social Determinants of Health   Financial Resource Strain: Not on file  Food Insecurity: No Food Insecurity (04/26/2023)   Hunger Vital Sign    Worried About Running Out of Food in the Last Year: Never true    Ran Out of Food in the Last Year: Never true  Transportation Needs: No Transportation Needs (04/26/2023)   PRAPARE - Administrator, Civil Service (Medical): No    Lack of Transportation (Non-Medical): No  Physical Activity: Not on file  Stress: Not on file  Social Connections: Not on file  Intimate Partner Violence: Not At Risk (04/26/2023)   Humiliation, Afraid, Rape, and Kick questionnaire    Fear of Current or Ex-Partner: No    Emotionally Abused: No    Physically Abused: No    Sexually Abused: No    FAMILY HISTORY: Family History  Problem Relation Age of Onset   Aneurysm Father    Stroke Mother    Heart disease Mother        Aneurysm   Hypertension Sister    Alcohol abuse Brother    Cancer Brother        mouth cancer   Aneurysm Paternal Uncle     ALLERGIES:  has No Known Allergies.  MEDICATIONS:   Current Outpatient Medications  Medication Sig Dispense Refill   oxyCODONE (OXY IR/ROXICODONE) 5 MG immediate release tablet Take 1 tablet (5 mg total) by mouth every 12 (twelve) hours as needed for severe pain. 20 tablet 0   augmented betamethasone dipropionate (DIPROLENE-AF) 0.05 % cream APPLY TO RASH ON THE SKIN TWICE DAILY FOR 2 3 WEEKS.     dexamethasone (DECADRON) 4 MG tablet Take 2 tablets (8 mg) by mouth daily x 3 days starting the day after cisplatin chemotherapy. Take with food. (Patient not taking: Reported on 05/22/2023) 30 tablet 1   lidocaine-prilocaine (EMLA) cream Apply to affected area once (Patient not taking: Reported on 05/22/2023) 30 g 3   ondansetron (ZOFRAN) 8 MG tablet Take 1 tablet (8 mg total) by mouth every 8 (eight) hours as needed for nausea or vomiting. Start on the third day after cisplatin. (Patient not taking: Reported on 05/22/2023) 30  tablet 1   prochlorperazine (COMPAZINE) 10 MG tablet Take 1 tablet (10 mg total) by mouth every 6 (six) hours as needed (Nausea or vomiting). (Patient not taking: Reported on 05/22/2023) 30 tablet 1   tamsulosin (FLOMAX) 0.4 MG CAPS capsule      No current facility-administered medications for this visit.   Facility-Administered Medications Ordered in Other Visits  Medication Dose Route Frequency Provider Last Rate Last Admin   0.9 % NaCl with KCl 20 mEq/ L  infusion   Intravenous Once Rhianne Soman, MD       heparin lock flush 100 unit/mL  500 Units Intracatheter Once PRN Finley Chevez, MD       magnesium sulfate IVPB 2 g 50 mL  2 g Intravenous Once Averill Pons, MD       sodium chloride flush (NS) 0.9 % injection 10 mL  10 mL Intracatheter PRN Drew Lips, Burnice Logan, MD         PHYSICAL EXAMINATION: ECOG PERFORMANCE STATUS: 0 - Asymptomatic  Vitals:   05/29/23 0922  BP: 135/69  Pulse: 72  Resp: 18  Temp: (!) 97.2 F (36.2 C)  SpO2: 96%    Filed Weights   05/29/23 0922  Weight: 194 lb 4.8 oz (88.1 kg)    Physical  Exam Constitutional:      Appearance: Normal appearance.  Cardiovascular:     Rate and Rhythm: Normal rate and regular rhythm.     Pulses: Normal pulses.     Heart sounds: Normal heart sounds.  Pulmonary:     Effort: Pulmonary effort is normal.     Breath sounds: Normal breath sounds.  Musculoskeletal:        General: Normal range of motion.     Cervical back: Normal range of motion. No rigidity.  Lymphadenopathy:     Cervical: No cervical adenopathy.  Skin:    General: Skin is warm and dry.  Neurological:     General: No focal deficit present.     Mental Status: He is alert.      LABORATORY DATA:  I have reviewed the data as listed Lab Results  Component Value Date   WBC 8.4 05/29/2023   HGB 14.8 05/29/2023   HCT 42.1 05/29/2023   MCV 96.8 05/29/2023   PLT 181 05/29/2023     Chemistry      Component Value Date/Time   NA 136 05/29/2023 0835   K 3.8 05/29/2023 0835   CL 105 05/29/2023 0835   CO2 27 05/29/2023 0835   BUN 20 05/29/2023 0835   CREATININE 0.90 05/29/2023 0835      Component Value Date/Time   CALCIUM 8.6 (L) 05/29/2023 0835   ALKPHOS 63 03/06/2023 1255   AST 13 (L) 03/06/2023 1255   ALT 8 03/06/2023 1255   BILITOT 0.5 03/06/2023 1255       RADIOGRAPHIC STUDIES: I have personally reviewed the radiological images as listed and agreed with the findings in the report. IR Gastrostomy Tube  Result Date: 05/28/2023 INDICATION: Head and neck cancer, malnutrition need for long-term enteric access EXAM: Placement of percutaneous gastrostomy tube using fluoroscopic guidance MEDICATIONS: Documented in the EMR ANESTHESIA/SEDATION: Moderate (conscious) sedation was employed during this procedure. A total of Versed 1 mg and Fentanyl 50 mcg was administered intravenously by the radiology nurse. Total intra-service moderate Sedation Time: 19 minutes. The patient's level of consciousness and vital signs were monitored continuously by radiology nursing throughout the  procedure under my direct supervision. CONTRAST:  30 mL Omnipaque 300-administered into  the gastric lumen. FLUOROSCOPY: Radiation Exposure Index (as provided by the fluoroscopic device): 3.8 minutes (42 mGy) COMPLICATIONS: None immediate. PROCEDURE: Informed written consent was obtained from the patient after a thorough discussion of the procedural risks, benefits and alternatives. All questions were addressed. Maximal Sterile Barrier Technique was utilized including caps, mask, sterile gowns, sterile gloves, sterile drape, hand hygiene and skin antiseptic. A timeout was performed prior to the initiation of the procedure. Review of pre-procedure CT scan demonstrates an adequate window for percutaneous placement of a gastrostomy tube. The patient was placed supine on the exam table. Using fluoroscopic guidance, an angled 5 French catheter was passed through the nares into the stomach. It was secured to the nose, and the stomach was insufflated with air. The abdomen was prepped and draped in the standard sterile fashion. After insufflating the stomach with air, puncture sites were selected and local analgesia was obtained with 1% lidocaine. Using fluoroscopic guidance, a gastropexy needle was advanced into the stomach and the T-bar suture was released. Entry into the stomach was confirmed with fluoroscopy, aspiration of air, and injection of contrast material. This was repeated with an additional gastropexy suture (for a total of 2 fasteners). At the center of these gastropexy sutures, a dermatotomy was performed. An 18 gauge needle was then passed into the stomach, and position within the gastric lumen again confirmed under fluoroscopy using aspiration of air and injection of contrast material. An Amplatz guidewire was passed through this needle and intraluminal placement was confirmed with fluoroscopy. The needle was removed, and over the guidewire, a 20 French balloon gastrostomy tube with a coaxial 10 mm balloon  was advanced into the percutaneous tract. Dilation of the percutaneous tract was then performed using the balloon, followed by advancement of the gastrostomy tube into the gastric lumen. The retention balloon was then inflated with 20 mL of sterile water, and the tube was brought back to the gastric wall. The wire and balloon were removed. The external bumper was brought to the skin. Location of the gastrostomy tube within the stomach was then confirmed with injection of contrast material, opacifying the gastric lumen. The gastrostomy tube was flushed with sterile water, and secured to the skin using a dressing. The patient tolerated the procedure well without immediate complication, and was transferred to recovery in stable condition. IMPRESSION: Successful placement of a percutaneous 20 French balloon retention gastrostomy tube using fluoroscopic guidance. Gastrostomy tube may be used after 4 hours. Electronically Signed   By: Olive Bass M.D.   On: 05/28/2023 12:02   IR IMAGING GUIDED PORT INSERTION  Result Date: 05/28/2023 INDICATION: Head and neck cancer, need for chemotherapy access EXAM: Chest port placement using ultrasound and fluoroscopic guidance MEDICATIONS: Documented in the EMR ANESTHESIA/SEDATION: Moderate (conscious) sedation was employed during this procedure. A total of Versed 3 mg and Fentanyl 150 mcg was administered intravenously. Moderate Sedation Time: 30 minutes. The patient's level of consciousness and vital signs were monitored continuously by radiology nursing throughout the procedure under my direct supervision. FLUOROSCOPY TIME:  Fluoroscopy Time: 0.5 minutes (1 mGy) COMPLICATIONS: None immediate. PROCEDURE: Informed written consent was obtained from the patient after a thorough discussion of the procedural risks, benefits and alternatives. All questions were addressed. Maximal Sterile Barrier Technique was utilized including caps, mask, sterile gowns, sterile gloves, sterile  drape, hand hygiene and skin antiseptic. A timeout was performed prior to the initiation of the procedure. The patient was placed supine on the exam table. The right neck and chest was  prepped and draped in the standard sterile fashion. A preliminary ultrasound of the right neck was performed and demonstrates a patent right internal jugular vein. A permanent ultrasound image was stored in the electronic medical record. The overlying skin was anesthetized with 1% Lidocaine. Using ultrasound guidance, access was obtained into the right internal jugular vein using a 21 gauge micropuncture set. A wire was advanced into the SVC, a short incision was made at the puncture site, and serial dilatation performed. Next, in an ipsilateral infraclavicular location, an incision was made at the site of the subcutaneous reservoir. Blunt dissection was used to open a pocket to contain the reservoir. A subcutaneous tunnel was then created from the port site to the puncture site. A(n) 8 Fr single lumen catheter was advanced through the tunnel. The catheter was attached to the port and this was placed in the subcutaneous pocket. Under fluoroscopic guidance, a peel away sheath was placed, and the catheter was trimmed to the appropriate length and was advanced into the central veins. The catheter length is 27 cm. The tip of the catheter lies near the superior cavoatrial junction. The port flushes and aspirates appropriately. The port was flushed and locked with heparinized saline. The port pocket was closed in 2 layers using 3-0 and 4-0 Vicryl/absorbable suture. Dermabond was also applied to both incisions. The patient tolerated the procedure well and was transferred to recovery in stable condition. IMPRESSION: Successful placement of a right-sided chest port via the right internal jugular vein. The port is ready for immediate use. Electronically Signed   By: Olive Bass M.D.   On: 05/28/2023 11:55   CT Super D Chest Wo  Contrast  Addendum Date: 05/22/2023   ADDENDUM REPORT: 05/22/2023 14:43 ADDENDUM: The original report was by Dr. Gaylyn Rong. The following addendum is by Dr. Gaylyn Rong: For my original exam, I compared to the CT images PET-CT from 03/12/2023. There is also an outside exam from [facility] dated 02/02/2023, I measure the ascending thoracic aortic aneurysm at 4.3 cm on that exam which is essentially stable from the current measurement. This can likely be surveilled in the context of the patient's surveillance oncology imaging. If the chest would not be imaged on surveillance oncology imaging, then I recommend annual imaging followup by CTA or MRA. This recommendation follows 2010 ACCF/AHA/AATS/ACR/ASA/SCA/SCAI/SIR/STS/SVM Guidelines for the Diagnosis and Management of Patients with Thoracic Aortic Disease. Circulation. 2010; 121: B147-W295. Aortic aneurysm NOS (ICD10-I71.9) Electronically Signed   By: Gaylyn Rong M.D.   On: 05/22/2023 14:43   Result Date: 05/22/2023 CLINICAL DATA:  Tonsillar cancer.  Lung nodule. EXAM: CT CHEST WITHOUT CONTRAST TECHNIQUE: Multidetector CT imaging of the chest was performed using thin slice collimation for electromagnetic bronchoscopy planning purposes, without intravenous contrast. RADIATION DOSE REDUCTION: This exam was performed according to the departmental dose-optimization program which includes automated exposure control, adjustment of the mA and/or kV according to patient size and/or use of iterative reconstruction technique. COMPARISON:  PET-CT 03/12/2023 FINDINGS: Cardiovascular: Coronary, aortic arch, and branch vessel atherosclerotic vascular disease. Ascending thoracic aortic aneurysm 4.2 cm in diameter. Mediastinum/Nodes: 1.0 cm right lower paratracheal node, image 76 series 2. Lungs/Pleura: Previously hypermetabolic and cavitary right upper lobe nodule 1.4 by 0.8 cm on image 53 series 4, previously 1.3 by 0.8 cm by my measurement on  03/12/2023. Prominent centrilobular emphysema. Upper Abdomen: Hepatic and left kidney upper pole cysts. Musculoskeletal: Unremarkable IMPRESSION: 1. Previously hypermetabolic and cavitary right upper lobe nodule is similar in size to the prior  PET-CT from 03/12/2023. 2. Borderline enlarged right lower paratracheal lymph node. 3. Aortic atherosclerosis. Ascending thoracic aortic aneurysm 4.2 cm in diameter. This can likely be followed in the context of the patient's anticipated follow up thoracic oncology imaging. 4. Emphysema. Aortic Atherosclerosis (ICD10-I70.0) and Emphysema (ICD10-J43.9). Electronically Signed: By: Gaylyn Rong M.D. On: 05/18/2023 16:34    All questions were answered. The patient knows to call the clinic with any problems, questions or concerns.  I spent 30 minutes in the care of this patientincluding H and P, review of records, counseling and coordination of care.     Rachel Moulds, MD 05/29/2023 10:12 AM

## 2023-05-30 ENCOUNTER — Other Ambulatory Visit: Payer: Self-pay

## 2023-05-30 ENCOUNTER — Ambulatory Visit
Admission: RE | Admit: 2023-05-30 | Discharge: 2023-05-30 | Disposition: A | Discharge status: Home / Self Care | Payer: Medicare Other | Source: Ambulatory Visit | Attending: Radiation Oncology | Admitting: Radiation Oncology

## 2023-05-30 DIAGNOSIS — R350 Frequency of micturition: Secondary | ICD-10-CM | POA: Diagnosis not present

## 2023-05-30 LAB — RAD ONC ARIA SESSION SUMMARY
Course Elapsed Days: 7
Plan Fractions Treated to Date: 5
Plan Prescribed Dose Per Fraction: 2 Gy
Plan Total Fractions Prescribed: 30
Plan Total Prescribed Dose: 60 Gy
Reference Point Dosage Given to Date: 10 Gy
Reference Point Session Dosage Given: 2 Gy
Session Number: 5

## 2023-05-30 NOTE — Therapy (Signed)
OUTPATIENT PHYSICAL THERAPY HEAD AND NECK BASELINE EVALUATION   Patient Name: Ethan [name] MRN: [id number] DOB:02/25/1951, 72 y.o., male Today's Date: 05/31/2023  END OF SESSION:  PT End of Session - 05/31/23 1009     Visit Number 1    Number of Visits 2    Date for PT Re-Evaluation 07/26/23    PT Start Time 0940    PT Stop Time 1008    PT Time Calculation (min) 28 min    Activity Tolerance Patient tolerated treatment well    Behavior During Therapy [facility] for tasks assessed/performed             Past Medical History:  Diagnosis Date   AAA (abdominal aortic aneurysm) (HCC)    Hyperlipidemia    Kidney stones    Renal disorder    Past Surgical History:  Procedure Laterality Date   ABDOMINAL AORTIC ANEURYSM REPAIR  06/15/2011   EVAR   CYSTOSCOPY WITH RETROGRADE PYELOGRAM, URETEROSCOPY AND STENT PLACEMENT Right 03/11/2015   Procedure: CYSTOSCOPY WITH RETROGRADE PYELOGRAM, AND RIGHT URETERAL STENT PLACEMENT;  Surgeon: Barron Alvine, MD;  Location: [facility];  Service: Urology;  Laterality: Right;   HERNIA REPAIR     IR GASTROSTOMY TUBE MOD SED  05/28/2023   IR IMAGING GUIDED PORT INSERTION  05/28/2023   LOWER BACK FUSION, L2-L3,AND L3-L4     RIGHT LOWER TIB-FEM FRACTURE     SHOULDER SURGERY Bilateral    SPINE SURGERY     THROAT SURGERY  04/06/2023   Patient Active Problem List   Diagnosis Date Noted   Port-A-Cath in place 05/29/2023   Cancer of tonsillar fossa (HCC) 03/07/2023   Arthralgia 08/29/2018   Weight loss 08/29/2018   Ureteral calculus, right 03/11/2015   Ureteral calculus 03/11/2015   Aftercare following surgery of the circulatory system, NEC 01/12/2014   Abdominal aneurysm without mention of rupture 07/08/2012    PCP: [name], NP  REFERRING PROVIDER: Lonie Peak, MD  REFERRING DIAG: C09.0 (ICD-10-CM) - Cancer of tonsillar fossa (HCC)   THERAPY DIAG:  Abnormal posture  Malignant neoplasm of tonsillar fossa (HCC)  Rationale for Evaluation and  Treatment: Rehabilitation  ONSET DATE: 04/06/23  SUBJECTIVE:     SUBJECTIVE STATEMENT: Patient reports they are here today to be seen by their medical team for newly diagnosed cancer of R tonsil.    PERTINENT HISTORY:  SCC of the right tonsil, stage II (T2, N2, M0, p 16 +), presented to Dr. Jenne Pane on 02/07/23 for evaluation of an increasing right tonsillar mass that he had first noticed 1 month prior. Exam of the oropharynx revealed an enlarged and irregular right tonsil, 1 + left tonsil and a couple of enlarged right zone 2 lymph nodes. 02/07/23 Biopsy in office by Dr. Jenne Pane revealed SCC, p 16 +. Dr. Jenne Pane referred him to Dr. Hezzie Bump at [facility] to discuss surgical options. 02/27/23 Consult with Dr. Hezzie Bump and following discussion he had opted for surgery at that time but then expressed desire to learn about non surgical options. 03/06/23 Consult with Dr. Basilio Cairo. After this consult he then made the decision to proceed with surgery and his scheduled appointment with Dr. Al Pimple was cancelled. 03/12/23 PET showed the right tonsillar mass as tracer avid and concerning for malignancy; hypermetabolic right level 2 and level 3 cervical lymph nodes compatible with metastatic adenopathy; indeterminate tracer avid right upper lobe elongated area of increased soft tissue thickening within the medial right upper lobe (SUV max of 5.08); and nonspecific mild tracer avid  left hilar lymph nodes. 04/06/23 He underwent TORS, right tonsillectomy and right neck dissection with Dr. Hezzie Bump. 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 Nodal status of 08/12 right level 2A, 3, and 4 lymph nodes positive for carcinoma; extranodal extension present; largest tumor deposit of 2.6 cm. Submandibular contents were also excised and  showed no evidence of malignancy. He also underwent pharyngoplasty repair of the soft palate defect at the time of his procedure. Chemo/radiation were recommended after surgery and he saw Dr. Basilio Cairo on 04/27/23 for reconsult and Dr. Al Pimple on 05/01/23 for consult. Chemotherapy/Radiation are planned as recommended. He will receive 30 fractions of radiation to his Oropharynx and bilateral neck along with weekly cisplatin which started on 05/24/23 and will complete 07/05/23. 03/20/23 extractions of teeth #18 and #19; 04/06/23 TORS, right neck dissection; 05/28/23 PEG/PAC placed  PATIENT GOALS:   to be educated about the signs and symptoms of lymphedema and learn post op HEP.   PAIN:  Are you having pain? Yes: NPRS scale: 3/10 Pain location: G tube Pain description: dull ache Aggravating factors: moving Relieving factors: being still  PRECAUTIONS: Active CA  WEIGHT BEARING RESTRICTIONS: No  FALLS:  Has patient fallen in last 6 months? No Does the patient have a fear of falling that limits activity? No Is the patient reluctant to leave the house due to a fear of falling?No  LIVING ENVIRONMENT: Patient lives with: wife Lives in: House/apartment Has following equipment at home: None  OCCUPATION: retired  LEISURE: does not exercise  PRIOR LEVEL OF FUNCTION: Independent   OBJECTIVE:  COGNITION: Overall cognitive status: Within functional limits for tasks assessed                  POSTURE:  Forward head and rounded shoulders posture  30 SEC SIT TO STAND: Unable today secondary to pain due to G tube placement  SHOULDER AROM:   WFL   CERVICAL AROM:   Percent limited  Flexion WFL  Extension 50% limited with pulling at incision  Right lateral flexion WFL  Left lateral flexion WFL  Right rotation WFL  Left rotation WFL    (Blank rows=not tested)  GAIT: Assessed: Yes Assistance needed: Independent Ambulation Distance: 10 feet Assistive Device: none Gait pattern: WFL Ambulation  surface: Level  PATIENT EDUCATION:  Education details: Neck ROM, importance of posture when sitting, standing and lying down, deep breathing, walking program and importance of staying active throughout treatment, CURE article on staying active, "Why exercise?" flyer, lymphedema and PT info Person educated: Patient Education method: Explanation, Demonstration, Handout Education comprehension: Patient verbalized understanding and returned demonstration  HOME EXERCISE PROGRAM: Patient was instructed today in a home exercise program today for head and neck range of motion exercises. These included active cervical flexion, active cervical extension, active cervical rotation to each direction, upper trap stretch, and shoulder retraction. Patient was encouraged to do these 2-3 times a day, holding for 5 sec each and completing for 5 reps. Pt was educated that once this becomes easier then hold the stretches for 30-60 seconds.    ASSESSMENT:  CLINICAL IMPRESSION: Pt arrives to PT with recently diagnosed R tonsillar cancer. He will receive 30 fractions of radiation to his Oropharynx and bilateral neck along with weekly cisplatin which started on 05/24/23 and will complete 07/05/23.Pt's cervical ROM was [facility] except  for extension which was limited by neck dissection scar tightness. Educated pt about signs and symptoms of lymphedema as well as anatomy and physiology of lymphatic system. Educated pt in importance of staying as active as possible throughout treatment to decrease fatigue as well as head and neck ROM exercises to decrease loss of ROM. Will see pt after completion of radiation to reassess ROM and assess for lymphedema and to determine therapy needs at that time.  Pt will benefit from skilled therapeutic intervention to improve on the following deficits: Decreased knowledge of precautions and postural dysfunction.   PT treatment/interventions: ADL/self-care home management, pt/family education,  therapeutic exercise.   REHAB POTENTIAL: Good  CLINICAL DECISION MAKING: Stable/uncomplicated  EVALUATION COMPLEXITY: Low   GOALS: Goals reviewed with patient? YES  LONG TERM GOALS: (STG=LTG)   Name Target Date  Goal status  1 Patient will be able to verbalize understanding of a home exercise program for cervical range of motion, posture, and walking.   Baseline:  No knowledge 05/31/2023 Achieved at eval  2 Patient will be able to verbalize understanding of proper sitting and standing posture. Baseline:  No knowledge 05/31/2023 Achieved at eval  3 Patient will be able to verbalize understanding of lymphedema risk and availability of treatment for this condition Baseline:  No knowledge 05/31/2023 Achieved at eval  4 Pt will demonstrate a return to full cervical ROM and function post operatively compared to baselines and not demonstrate any signs or symptoms of lymphedema.  Baseline: See objective measurements taken today. 07/26/23 New    PLAN:  PT FREQUENCY/DURATION: EVAL and 1 follow up appointment.   PLAN FOR NEXT SESSION: will reassess 2 weeks after completion of radiation to determine needs.  Patient will follow up at outpatient cancer rehab 2 weeks after completion of radiation.  If the patient requires physical therapy at that time, a specific plan will be dictated and sent to the referring physician for approval. The patient was educated today on appropriate basic range of motion exercises to begin now and continue throughout radiation and educated on the signs and symptoms of lymphedema. Patient verbalized good understanding.     Physical Therapy Information for During and After Head/Neck Cancer Treatment: Lymphedema is a swelling condition that you may be at risk for in your neck and/or face if you have radiation treatment to the area and/or if you have surgery that includes removing lymph nodes.  There is treatment available for this condition and it is not life-threatening.   Contact your physician or physical therapist with concerns. An excellent resource for those seeking information on lymphedema is the National Lymphedema Network's website.  It can be accessed at www.lymphnet.org If you notice swelling in your neck or face at any time following surgery (even if it is many years from now), please contact your doctor or physical therapist to discuss this.  Lymphedema can be treated at any time but it is easier for you if it is treated early on. If you have had surgery to your neck, please check with your surgeon about how soon to start doing neck range of motion exercises.  If you are not having surgery, I encourage you to start doing neck range of motion exercises today and continue these while undergoing treatment, UNLESS you have irritation of your skin or soft tissue that is aggravated by doing them.  These exercises are intended to help you prevent loss of range of motion and/or to gain range of motion in your neck (which can  be limited by tightening effects of radiation), and NOT to aggravate these tissues if they develop sensitivities from treatment. Neck range of motion exercises should be done to the point of feeling a GENTLE, TOLERABLE stretch only.  You are encouraged to start a walking or other exercise program tomorrow and continue this as much as you are able through and after treatment.  Please feel free to call me with any questions. Leonette Most, PT, CLT Physical Therapist and Certified Lymphedema Therapist [facility] [address], [place], [place] [address] [phone] [email or web address]  WALKING  Walking is a great form of exercise to increase your strength, endurance and overall fitness.  A walking program can help you start slowly and gradually build endurance as you go.  Everyone's ability is different, so each person's starting point will be different.  You do not have to follow them  exactly.  The are just samples. You should simply find out what's right for you and stick to that program.   In the beginning, you'll start off walking 2-3 times a day for short distances.  As you get stronger, you'll be walking further at just 1-2 times per day.  A. You Can Walk For A Certain Length Of Time Each Day    Walk 5 minutes 3 times per day.  Increase 2 minutes every 2 days (3 times per day).  Work up to 25-30 minutes (1-2 times per day).   Example:   Day 1-2 5 minutes 3 times per day   Day 7-8 12 minutes 2-3 times per day   Day 13-14 25 minutes 1-2 times per day  B. You Can Walk For a Certain Distance Each Day     Distance can be substituted for time.    Example:   3 trips to mailbox (at road)   3 trips to corner of block   3 trips around the block  C. Go to local high school and use the track.    Walk for distance ____ around track  Or time ____ minutes  D. Walk ____ Jog ____ Run ___   Why exercise?  So many benefits! Here are SOME of them: Heart health, including raising your good cholesterol level and reducing heart rate and blood pressure Lung health, including improved lung capacity It burns fats, and most of [place] can stand to be leaner, whether or not we are overweight. It increases the body's natural painkillers and mood elevators, so makes you feel better. Not only makes you feel better, but look better too Improves sleep Takes a bite out of stress May decrease your risk of many types of cancer If you are currently undergoing cancer treatment, exercise may improve your ability to tolerate treatments including chemotherapy. For everybody, it can improve your energy level. Those with cancer-related fatigue report a 40-50% reduction in this symptom when exercising regularly. If you are a survivor of breast, colon, or prostate cancer, it may decrease your risk of a recurrence. (This may hold for other cancers too, but so far we have data just for these three  types.)  How to exercise: Get your doctor's okay. Pick something you enjoy doing, like walking, Zumba, biking, swimming, or whatever. Start at low intensity and time, then gradually increase.  (See walking program handout.) Set a goal to achieve over time.  The American Cancer Society, American Heart Association, and U.S. Dept. of Health and Human Services recommend 150 minutes of moderate exercise, 75 minutes of vigorous  exercise, or a combination of both per week. This should be done in episodes at least 10 minutes long, spread throughout the week.  Need help being motivated? Pick something you enjoy doing, because you'll be more inclined to stick with that activity than something that feels like a chore. Do it with a friend so that you are accountable to each other. Schedule it into your day. Place it on your calendar and keep that appointment just like you do any appointment that you make. Join an exercise group that meets at a specific time.  That way, you have to show up on time, and that makes it harder to procrastinate about doing your workout.  It also keeps you accountable--people begin to expect you to be there. Join a gym where you feel comfortable and not intimidated, at the right cost. Sign up for something that you'll need to be in shape for on a specific date, like a 1K or a 5K to walk or run, a 20 or 30 mile bike ride, a mud run or something like that. If the date is looming, you know you'll need to train to be ready for it.  An added benefit is that many of these are fundraisers for good causes. If you've already paid for a gym membership, group exercise class or event, you might as well work out, so you haven't wasted your money!    [employer], PT 05/31/2023, 10:13 AM

## 2023-05-31 ENCOUNTER — Other Ambulatory Visit: Payer: Self-pay

## 2023-05-31 ENCOUNTER — Ambulatory Visit: Payer: Medicare Other | Attending: Radiation Oncology

## 2023-05-31 ENCOUNTER — Encounter: Payer: Self-pay | Admitting: Physical Therapy

## 2023-05-31 ENCOUNTER — Ambulatory Visit: Payer: Medicare Other | Attending: Radiation Oncology | Admitting: Physical Therapy

## 2023-05-31 ENCOUNTER — Ambulatory Visit
Admission: RE | Admit: 2023-05-31 | Discharge: 2023-05-31 | Disposition: A | Discharge status: Home / Self Care | Payer: Medicare Other | Source: Ambulatory Visit | Attending: Radiation Oncology | Admitting: Radiation Oncology

## 2023-05-31 DIAGNOSIS — C090 Malignant neoplasm of tonsillar fossa: Secondary | ICD-10-CM | POA: Insufficient documentation

## 2023-05-31 DIAGNOSIS — R293 Abnormal posture: Secondary | ICD-10-CM | POA: Diagnosis present

## 2023-05-31 DIAGNOSIS — R350 Frequency of micturition: Secondary | ICD-10-CM | POA: Diagnosis not present

## 2023-05-31 DIAGNOSIS — R1310 Dysphagia, unspecified: Secondary | ICD-10-CM | POA: Insufficient documentation

## 2023-05-31 LAB — RAD ONC ARIA SESSION SUMMARY
Course Elapsed Days: 8
Plan Fractions Treated to Date: 6
Plan Prescribed Dose Per Fraction: 2 Gy
Plan Total Fractions Prescribed: 30
Plan Total Prescribed Dose: 60 Gy
Reference Point Dosage Given to Date: 12 Gy
Reference Point Session Dosage Given: 2 Gy
Session Number: 6

## 2023-05-31 NOTE — Patient Instructions (Addendum)
SWALLOWING EXERCISES ?Do these until 6 months after your last day of radiation, then 2 times per week afterwards ? ?Effortful Swallows ?- Press your tongue against the roof of your mouth for 3 seconds, then squeeze the muscles in your neck while you swallow your saliva or a sip of water ?- Repeat 10-15 times, 2-3 times a day, and use whenever you eat or drink ? ?[place] Swallow - swallow with your tongue sticking out ?- Stick tongue out past your lips and gently bite tongue with your teeth ?- Swallow, while holding your tongue with your teeth ?- Repeat 10-15 times, 2-3 times a day ?*use a wet spoon if your mouth gets dry* ? ?Shaker Exercise - head lift ?- Lie flat on your back in your bed or on a couch without pillows ?- Raise your head and look at your feet - KEEP YOUR SHOULDERS DOWN ?- HOLD FOR 45-60 SECONDS, then lower your head back down ?- Repeat 3 times, 2-3 times a day ? ?Mendelsohn Maneuver - ?half swallow? exercise ?- Start to swallow, and keep your Adam?s apple up by squeezing hard with the muscles of the throat ?- Hold the squeeze for 5-7 seconds and then relax ?- Repeat 10-15 times, 2-3 times a day ?*use a wet spoon if your mouth gets dry* ? ?      5.   "Super Swallow" ? - Take a breath and hold it ? - Swallow then IMMEDIATELY cough ? - Repeat 10 times, 2-3 times a day ? ?

## 2023-05-31 NOTE — Progress Notes (Signed)
Oncology Nurse Navigator Documentation   I met with Ethan [name] before and after his [facility] appointments today. He is tolerating treatment well. He knows to call me if he has any questions or concerns as treatment progresses.  Hedda Slade RN, BSN, OCN Head & Neck Oncology Nurse Navigator [facility] Phone # [phone]  Fax # [phone]

## 2023-05-31 NOTE — Therapy (Signed)
OUTPATIENT SPEECH LANGUAGE PATHOLOGY ONCOLOGY EVALUATION   Patient Name: Ethan [name] MRN: [id number] DOB:Oct 21, 1951, 72 y.o., male Today's Date: 05/31/2023  PCP: [facility] REFERRING PROVIDER: Lonie Peak, MD  END OF SESSION:  End of Session - 05/31/23 1323     Visit Number 1    Number of Visits 7    Date for SLP Re-Evaluation 08/29/23    SLP Start Time 0852    SLP Stop Time  0935    SLP Time Calculation (min) 43 min    Activity Tolerance Patient tolerated treatment well             Past Medical History:  Diagnosis Date   AAA (abdominal aortic aneurysm) (HCC)    Hyperlipidemia    Kidney stones    Renal disorder    Past Surgical History:  Procedure Laterality Date   ABDOMINAL AORTIC ANEURYSM REPAIR  06/15/2011   EVAR   CYSTOSCOPY WITH RETROGRADE PYELOGRAM, URETEROSCOPY AND STENT PLACEMENT Right 03/11/2015   Procedure: CYSTOSCOPY WITH RETROGRADE PYELOGRAM, AND RIGHT URETERAL STENT PLACEMENT;  Surgeon: Barron Alvine, MD;  Location: [facility];  Service: Urology;  Laterality: Right;   HERNIA REPAIR     IR GASTROSTOMY TUBE MOD SED  05/28/2023   IR IMAGING GUIDED PORT INSERTION  05/28/2023   LOWER BACK FUSION, L2-L3,AND L3-L4     RIGHT LOWER TIB-FEM FRACTURE     SHOULDER SURGERY Bilateral    SPINE SURGERY     THROAT SURGERY  04/06/2023   Patient Active Problem List   Diagnosis Date Noted   Port-A-Cath in place 05/29/2023   Cancer of tonsillar fossa (HCC) 03/07/2023   Arthralgia 08/29/2018   Weight loss 08/29/2018   Ureteral calculus, right 03/11/2015   Ureteral calculus 03/11/2015   Aftercare following surgery of the circulatory system, NEC 01/12/2014   Abdominal aneurysm without mention of rupture 07/08/2012    ONSET DATE: See "pertinent history" below   REFERRING DIAG: Cancer of tonsillar fossa  THERAPY DIAG:  Dysphagia, unspecified type  Rationale for Evaluation and Treatment: Rehabilitation  SUBJECTIVE:   SUBJECTIVE STATEMENT: Denies  overt s/sx dysphagia, after getting over effects of TORS Pt accompanied by: self  PERTINENT HISTORY:  SCC of the right tonsil, stage II. He presented to Dr. Jenne Pane on 02/07/23 for evaluation of an increasing right tonsillar mass that he had first noticed 1 month prior. Exam of the oropharynx revealed an enlarged and irregular right tonsil, 1 + left tonsil and a couple of enlarged right zone 2 lymph nodes. 02/07/23 Biopsy in office by Dr. Jenne Pane revealed SCC, p 16 +. Dr. Jenne Pane referred him to Dr. Hezzie Bump at [facility] to discuss surgical options. 02/27/23 Consult with Dr. Hezzie Bump and following discussion he had opted for surgery at that time but then expressed desire to learn about non surgical options. 03/06/23 Consult with Dr. Basilio Cairo. After this consult he then made the decision to proceed with surgery and his scheduled appointment with Dr. Al Pimple was cancelled.03/12/23 PET showed the right tonsillar mass as tracer avid and concerning for malignancy; hypermetabolic right level 2 and level 3 cervical lymph nodes compatible with metastatic adenopathy; indeterminate tracer avid right upper lobe elongated area of increased soft tissue thickening within the medial right upper lobe (SUV max of 5.08); and nonspecific mild tracer avid left hilar lymph nodes. 04/06/23 He underwent TORS, right tonsillectomy and right neck dissection with Dr. Hezzie Bump. 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 Nodal status of 08/12 right level 2A, 3, and 4 lymph nodes positive for carcinoma; extranodal extension present; largest tumor deposit of 2.6 cm. Submandibular contents were also excised and showed no evidence of malignancy. He also underwent pharyngoplasty repair of the soft palate defect at the time of his procedure. Unfortunately  chemo/radiation were recommended after surgery and he saw Dr. Basilio Cairo on 04/27/23 for reconsult and Dr. Al Pimple on 05/01/23 for consult. Chemotherapy/Radiation are planned as recommended. Treatment plan:  He will receive 30 fractions of radiation to his Oropharynx and bilateral neck along with weekly cisplatin which started on 05/24/23 and will complete 07/05/23.  PAIN:  Are you having pain? No  FALLS: Has patient fallen in last 6 months?  No  LIVING ENVIRONMENT: Lives with: lives with their spouse Lives in: House/apartment  PLOF:  Level of assistance: Independent with ADLs, Independent with IADLs Employment: Retired  PATIENT GOALS: Maintain WNL swallowing  OBJECTIVE:   COGNITION: Overall cognitive status: Within functional limits for tasks assessed  LANGUAGE: Receptive and Expressive language appeared WNL.  ORAL MOTOR EXAMINATION: Overall status: WFL  MOTOR SPEECH: Overall motor speech: Appears intact Phonation: normal Resonance: WFL Articulation: Appears intact Intelligibility: Intelligible  CLINICAL SWALLOW ASSESSMENT:   Current diet: regular and thin liquids Objective swallow impairments: none Objective recommended compensations: none Dentition: adequate natural dentition Patient directly observed with POs: Yes: dysphagia 3 (soft) and thin liquids  Feeding: able to feed self Liquids provided by: cup Oral phase signs and symptoms:  none noted Pharyngeal phase signs and symptoms:  none noted  TODAY'S TREATMENT:                                                                                                                                         DATE:  05/31/23 (Eval): Research states the risk for dysphagia increases due to radiation and/or chemotherapy treatment due to a variety of factors, so SLP educated the pt about the possibility of reduced/limited ability for PO intake during rad tx. SLP also educated pt regarding possible changes to swallowing musculature after rad tx,  and why adherence to dysphagia HEP provided today and PO consumption was necessary to inhibit muscle fibrosis following rad tx and to mitigate muscle disuse atrophy. SLP informed pt why this would be detrimental to their swallowing status and to their pulmonary health. Pt demonstrated understanding of these things to SLP. SLP encouraged pt to safely eat and drink as deep into their radiation/chemotherapy as possible to provide the best possible long-term swallowing outcome for pt.  SLP then developed an individualized HEP for pt involving oral and pharyngeal strengthening and ROM and pt was instructed how to perform these exercises, including SLP demonstration. After SLP demonstration, pt return demonstrated each exercise. SLP ensured pt performance was correct prior to educating pt on  next exercise. Pt required occasional min-mod cues faded to modified independent to perform HEP. Pt was instructed to complete this program 6-7 days/week, at least 2 times a day until 6 months after his or her last day of rad tx, and then x2 a week after that, indefinitely. Among other modifications for days when pt cannot functionally swallow, SLP also suggested pt to perform only non-swallowing tasks on the handout/HEP, and if necessary to cycle through the swallowing portion so the full program of exercises can be completed instead of fatiguing on one of the swallowing exercises and being unable to perform the other swallowing exercises. SLP instructed that swallowing exercises should then be added back into the regimen as pt is able to do so.   PATIENT EDUCATION: Education details: late effects head/neck radiation on swallow function, HEP procedure, and modification to HEP when difficulty experienced with swallowing during and after radiation course Person educated: Patient and Child(ren) Education method: Explanation, Demonstration, Verbal cues, and Handouts Education comprehension: verbalized understanding, returned  demonstration, verbal cues required, and needs further education   ASSESSMENT:  CLINICAL IMPRESSION: Patient is a 72 y.o. M who was seen today for assessment of swallowing as they undergo radiation/chemoradiation therapy. Today pt ate [place] sandwich and drank thin liquids without overt s/s oral or pharyngeal difficulty. At this time pt swallowing is deemed WNL/WFL with these POs. No oral or overt s/sx pharyngeal deficits, including aspiration were observed. There are no overt s/s aspiration PNA observed by SLP nor any reported by pt at this time. Data indicate that pt's swallow ability will likely decrease over the course of radiation/chemoradiation therapy and could very well decline over time following the conclusion of that therapy due to muscle disuse atrophy and/or muscle fibrosis. Pt will cont to need to be seen by SLP in order to assess safety of PO intake, assess the need for recommending any objective swallow assessment, and ensuring pt is correctly completing the individualized HEP.  OBJECTIVE IMPAIRMENTS: include dysphagia. These impairments are limiting patient from safety when swallowing. Factors affecting potential to achieve goals and functional outcome are  none noted . Patient will benefit from skilled SLP services to address above impairments and improve overall function.  REHAB POTENTIAL: Good  SHORT TERM GOALS: Target: 3rd total session   Pt will complete HEP with modified independence in 2 sessions Baseline: Goal status: Initial   2.  pt will tell SLP why pt is completing HEP with modified independence Baseline:  Goal status: Initial   3.  pt will describe 3 overt s/s aspiration PNA with modified independence Baseline:  Goal status: Initial   4.  pt will tell SLP how a food journal could hasten return to a more normalized diet Baseline:  Goal status: Initial     LONG TERM GOALS: Target: 7th total session   1.  pt will complete HEP with independence over two  visits Baseline:  Goal status: Initial   2.  pt will describe how to modify HEP over time, and the timeline associated with reduction in HEP frequency with modified independence over two sessions Baseline:  Goal status: Initial   PLAN:   SLP FREQUENCY:  once approx every 4 weeks   SLP DURATION:  6 sessions   PLANNED INTERVENTIONS: Aspiration precaution training, Pharyngeal strengthening exercises, Diet toleration management , Environmental controls, Trials of upgraded texture/liquids, Cueing hierachy, Internal/external aids, SLP instruction and feedback, Compensatory strategies, and Patient/family education    [facility], CCC-SLP 05/31/2023, 1:23 PM

## 2023-06-01 ENCOUNTER — Ambulatory Visit: Admission: RE | Admit: 2023-06-01 | Payer: Medicare Other | Source: Ambulatory Visit

## 2023-06-01 ENCOUNTER — Other Ambulatory Visit: Payer: Self-pay

## 2023-06-01 ENCOUNTER — Ambulatory Visit
Admission: RE | Admit: 2023-06-01 | Discharge: 2023-06-01 | Disposition: A | Discharge status: Home / Self Care | Payer: Medicare Other | Source: Ambulatory Visit | Attending: Radiation Oncology | Admitting: Radiation Oncology

## 2023-06-01 DIAGNOSIS — R350 Frequency of micturition: Secondary | ICD-10-CM | POA: Diagnosis not present

## 2023-06-01 LAB — RAD ONC ARIA SESSION SUMMARY
Course Elapsed Days: 9
Plan Fractions Treated to Date: 7
Plan Prescribed Dose Per Fraction: 2 Gy
Plan Total Fractions Prescribed: 30
Plan Total Prescribed Dose: 60 Gy
Reference Point Dosage Given to Date: 14 Gy
Reference Point Session Dosage Given: 2 Gy
Session Number: 7

## 2023-06-04 ENCOUNTER — Ambulatory Visit
Admission: RE | Admit: 2023-06-04 | Discharge: 2023-06-04 | Disposition: A | Discharge status: Home / Self Care | Payer: Medicare Other | Source: Ambulatory Visit | Attending: Radiation Oncology | Admitting: Radiation Oncology

## 2023-06-04 ENCOUNTER — Other Ambulatory Visit: Payer: Self-pay

## 2023-06-04 DIAGNOSIS — R350 Frequency of micturition: Secondary | ICD-10-CM | POA: Diagnosis not present

## 2023-06-04 LAB — RAD ONC ARIA SESSION SUMMARY
Course Elapsed Days: 12
Plan Fractions Treated to Date: 8
Plan Prescribed Dose Per Fraction: 2 Gy
Plan Total Fractions Prescribed: 30
Plan Total Prescribed Dose: 60 Gy
Reference Point Dosage Given to Date: 16 Gy
Reference Point Session Dosage Given: 2 Gy
Session Number: 8

## 2023-06-04 MED FILL — Fosaprepitant Dimeglumine For IV Infusion 150 MG (Base Eq): INTRAVENOUS | Qty: 5 | Status: AC

## 2023-06-04 MED FILL — Dexamethasone Sodium Phosphate Inj 100 MG/10ML: INTRAMUSCULAR | Qty: 1 | Status: AC

## 2023-06-05 ENCOUNTER — Inpatient Hospital Stay: Payer: Medicare Other

## 2023-06-05 ENCOUNTER — Inpatient Hospital Stay: Payer: Medicare Other | Admitting: Hematology and Oncology

## 2023-06-05 ENCOUNTER — Other Ambulatory Visit: Payer: Self-pay

## 2023-06-05 ENCOUNTER — Inpatient Hospital Stay: Payer: Medicare Other | Admitting: Dietician

## 2023-06-05 ENCOUNTER — Ambulatory Visit
Admission: RE | Admit: 2023-06-05 | Discharge: 2023-06-05 | Disposition: A | Discharge status: Home / Self Care | Payer: Medicare Other | Source: Ambulatory Visit | Attending: Radiation Oncology | Admitting: Radiation Oncology

## 2023-06-05 VITALS — BP 143/79 | HR 69 | Temp 97.7°F | Resp 20

## 2023-06-05 DIAGNOSIS — Z95828 Presence of other vascular implants and grafts: Secondary | ICD-10-CM

## 2023-06-05 DIAGNOSIS — C090 Malignant neoplasm of tonsillar fossa: Secondary | ICD-10-CM

## 2023-06-05 DIAGNOSIS — R350 Frequency of micturition: Secondary | ICD-10-CM | POA: Diagnosis not present

## 2023-06-05 LAB — CBC WITH DIFFERENTIAL (CANCER CENTER ONLY)
Abs Immature Granulocytes: 0.04 10*3/uL (ref 0.00–0.07)
Basophils Absolute: 0 10*3/uL (ref 0.0–0.1)
Basophils Relative: 0 %
Eosinophils Absolute: 0.1 10*3/uL (ref 0.0–0.5)
Eosinophils Relative: 1 %
HCT: 40.5 % (ref 39.0–52.0)
Hemoglobin: 14 g/dL (ref 13.0–17.0)
Immature Granulocytes: 1 %
Lymphocytes Relative: 8 %
Lymphs Abs: 0.6 10*3/uL — ABNORMAL LOW (ref 0.7–4.0)
MCH: 33.4 pg (ref 26.0–34.0)
MCHC: 34.6 g/dL (ref 30.0–36.0)
MCV: 96.7 fL (ref 80.0–100.0)
Monocytes Absolute: 0.4 10*3/uL (ref 0.1–1.0)
Monocytes Relative: 6 %
Neutro Abs: 6.5 10*3/uL (ref 1.7–7.7)
Neutrophils Relative %: 84 %
Platelet Count: 161 10*3/uL (ref 150–400)
RBC: 4.19 MIL/uL — ABNORMAL LOW (ref 4.22–5.81)
RDW: 12.3 % (ref 11.5–15.5)
WBC Count: 7.7 10*3/uL (ref 4.0–10.5)
nRBC: 0 % (ref 0.0–0.2)

## 2023-06-05 LAB — RAD ONC ARIA SESSION SUMMARY
Course Elapsed Days: 13
Plan Fractions Treated to Date: 9
Plan Prescribed Dose Per Fraction: 2 Gy
Plan Total Fractions Prescribed: 30
Plan Total Prescribed Dose: 60 Gy
Reference Point Dosage Given to Date: 18 Gy
Reference Point Session Dosage Given: 2 Gy
Session Number: 9

## 2023-06-05 LAB — BASIC METABOLIC PANEL - CANCER CENTER ONLY
Anion gap: 6 (ref 5–15)
BUN: 25 mg/dL — ABNORMAL HIGH (ref 8–23)
CO2: 27 mmol/L (ref 22–32)
Calcium: 8.8 mg/dL — ABNORMAL LOW (ref 8.9–10.3)
Chloride: 104 mmol/L (ref 98–111)
Creatinine: 0.99 mg/dL (ref 0.61–1.24)
GFR, Estimated: >60 mL/min (ref >60)
Glucose, Bld: 86 mg/dL (ref 70–99)
Potassium: 3.9 mmol/L (ref 3.5–5.1)
Sodium: 137 mmol/L (ref 135–145)

## 2023-06-05 LAB — MAGNESIUM: Magnesium: 2 mg/dL (ref 1.7–2.4)

## 2023-06-05 MED ORDER — POTASSIUM CHLORIDE IN NACL 20-0.9 MEQ/L-% IV SOLN
Freq: Once | INTRAVENOUS | Status: AC
  Filled 2023-06-05: qty 1000

## 2023-06-05 MED ORDER — HEPARIN SOD (PORK) LOCK FLUSH 100 UNIT/ML IV SOLN
500.0000 [IU] | Freq: Once | INTRAVENOUS | Status: AC | PRN
  Administered 2023-06-05: 500 [IU]

## 2023-06-05 MED ORDER — SODIUM CHLORIDE 0.9% FLUSH
10.0000 mL | Freq: Once | INTRAVENOUS | Status: AC
  Administered 2023-06-05: 10 mL

## 2023-06-05 MED ORDER — SODIUM CHLORIDE 0.9 % IV SOLN
150.0000 mg | Freq: Once | INTRAVENOUS | Status: AC
  Administered 2023-06-05: 150 mg via INTRAVENOUS
  Filled 2023-06-05: qty 150

## 2023-06-05 MED ORDER — LIDOCAINE VISCOUS HCL 2 % MT SOLN: 15.0000 mL | OROMUCOSAL | 0 refills | Status: DC | PRN

## 2023-06-05 MED ORDER — SODIUM CHLORIDE 0.9% FLUSH
10.0000 mL | INTRAVENOUS | Status: DC | PRN
  Administered 2023-06-05: 10 mL

## 2023-06-05 MED ORDER — SODIUM CHLORIDE 0.9 % IV SOLN: Freq: Once | INTRAVENOUS | Status: AC

## 2023-06-05 MED ORDER — PALONOSETRON HCL INJECTION 0.25 MG/5ML
0.2500 mg | Freq: Once | INTRAVENOUS | Status: AC
  Administered 2023-06-05: 0.25 mg via INTRAVENOUS
  Filled 2023-06-05: qty 5

## 2023-06-05 MED ORDER — SODIUM CHLORIDE 0.9 % IV SOLN
10.0000 mg | Freq: Once | INTRAVENOUS | Status: AC
  Administered 2023-06-05: 10 mg via INTRAVENOUS
  Filled 2023-06-05: qty 10

## 2023-06-05 MED ORDER — SODIUM CHLORIDE 0.9 % IV SOLN
40.0000 mg/m2 | Freq: Once | INTRAVENOUS | Status: AC
  Administered 2023-06-05: 86 mg via INTRAVENOUS
  Filled 2023-06-05: qty 86

## 2023-06-05 MED ORDER — MAGNESIUM SULFATE 2 GM/50ML IV SOLN
2.0000 g | Freq: Once | INTRAVENOUS | Status: AC
  Administered 2023-06-05: 2 g via INTRAVENOUS
  Filled 2023-06-05: qty 50

## 2023-06-05 NOTE — Progress Notes (Signed)
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 We have discussed about adjuvant chemoradiation when we last met him. Recently we had a conversation that the RUL nodule is persistent and cavitary and hence is of concern that it could be malignant but Dr Basilio Cairo feels it could be primary lung and slow growing overall, so we should proceed with Chemoradiation and follow up on lung nodule especially since there is no significant growth on the most recent scans. He is now post second weekly cycle of cisplatin.  Since his last visit, he has noticed some sore throat, he says the pain is not intense to use oxycodone. He also has constipation, taking laxatives. On exam, some mucositis noted, G tube site with mild erythema but no infection. Encouraged him to use viscous lidocaine and oxycodone as needed for pain. No other concerns, ok to proceed with week 3 of cisplatin as planned if all labs are within parameters. Encouraged adequate nutrition. RTC in one week.  HISTORY OF PRESENTING ILLNESS:  Ethan [name] 72 y.o. male is here because of  SCC tongue, P16 positive.  This is a very pleasant 72 year old male patient with past medical history significant for abdominal aortic aneurysm status postrepair now diagnosed with squamous cell carcinoma of the base of the tongue at diagnosis had T1 N1 M0 status post definitive surgery had radical tonsillectomy right neck lymph node dissection with final pathology showing tumor size of 2.7 cm nonkeratinizing squamous cell carcinoma with focal maturation, HPV associated, lymphovascular invasion and perineural invasion identified, negative surgical margins, 8 out of 12 lymph nodes with carcinoma and the largest tumor deposit was 2.6 cm in greatest dimension extranodal extension present referred to medical oncology for adjuvant chemotherapy.  At baseline he is extremely healthy, very active, eats healthy and denies any major medical issues.  Initially when he saw Dr. Basilio Cairo before surgery, he was discussed the option for concurrent chemoradiation versus surgery but he chose to proceed with surgery. When I last saw him, we discussed about concurrent CRT. He also has a slow growing RUL which is likely a second primary lung malignancy. Group discussion was to continue with planned chemo RT and evaluate this RUL nodule after completing adjuvant chemotherapy.  He is here after his second week of chemotherapy. Since yesterday, he has some sore throat but he says pain is not intense enough to take oxycodone. He also had to deal with constipation, taking miralax. He is eating well, lack of taste. No hearing changes, no neuropathy.  MEDICAL HISTORY:  Past Medical History:  Diagnosis Date   AAA (abdominal aortic aneurysm) (HCC)    Hyperlipidemia    Kidney stones    Renal disorder     SURGICAL HISTORY: Past Surgical History:  Procedure Laterality Date   ABDOMINAL AORTIC ANEURYSM REPAIR  06/15/2011   EVAR   CYSTOSCOPY WITH RETROGRADE PYELOGRAM, URETEROSCOPY AND STENT PLACEMENT Right 03/11/2015   Procedure:  CYSTOSCOPY WITH RETROGRADE PYELOGRAM, AND RIGHT URETERAL STENT PLACEMENT;  Surgeon: Barron Alvine, MD;  Location: [facility];  Service: Urology;  Laterality: Right;   HERNIA REPAIR     IR GASTROSTOMY TUBE MOD SED  05/28/2023   IR IMAGING GUIDED PORT INSERTION  05/28/2023   LOWER BACK FUSION, L2-L3,AND L3-L4     RIGHT LOWER TIB-FEM FRACTURE     SHOULDER SURGERY Bilateral    SPINE SURGERY     THROAT SURGERY  04/06/2023    SOCIAL HISTORY: Social History   Socioeconomic History   Marital status: Married    Spouse name: Not on file   Number of children: Not on file   Years of education: Not on file   Highest education level: Not on file  Occupational History   Not on file  Tobacco Use   Smoking status: Some Days    Packs/day: .5    Types: Cigarettes   Smokeless tobacco: Never  Vaping Use   Vaping Use: Never used  Substance and Sexual Activity   Alcohol use: Yes    Alcohol/week: 0.0 standard drinks of alcohol    Comment: occ   Drug use: No   Sexual activity: Not Currently  Other Topics Concern   Not on file  Social History Narrative   Not on file   Social Determinants of Health   Financial Resource Strain: Not on file  Food Insecurity: No Food Insecurity (04/26/2023)   Hunger Vital Sign    Worried About Running Out of Food in the Last Year: Never true    Ran Out of Food in the Last Year: Never true  Transportation Needs: No Transportation Needs (04/26/2023)   PRAPARE - Administrator, Civil Service (Medical): No    Lack of Transportation (Non-Medical): No  Physical Activity: Not on file  Stress: Not on file  Social Connections: Not on file  Intimate Partner Violence: Not At Risk (04/26/2023)   Humiliation, Afraid, Rape, and Kick questionnaire    Fear of Current or Ex-Partner: No    Emotionally Abused: No    Physically Abused: No    Sexually Abused: No    FAMILY HISTORY: Family History  Problem Relation Age of Onset   Aneurysm Father    Stroke Mother    Heart  disease Mother        Aneurysm   Hypertension Sister    Alcohol abuse Brother    Cancer Brother        mouth cancer   Aneurysm Paternal Uncle     ALLERGIES:  has No Known Allergies.  MEDICATIONS:  Current Outpatient Medications  Medication Sig Dispense Refill   augmented betamethasone dipropionate (DIPROLENE-AF) 0.05 % cream APPLY TO RASH ON THE SKIN TWICE DAILY FOR 2 3 WEEKS.     dexamethasone (DECADRON) 4 MG tablet Take 2 tablets (8 mg) by mouth daily x 3 days starting the day after cisplatin chemotherapy. Take with food. (Patient not taking: Reported on 05/22/2023) 30 tablet 1   lidocaine-prilocaine (EMLA) cream Apply to affected area once (Patient not taking: Reported on 05/22/2023) 30 g 3   ondansetron (ZOFRAN) 8 MG tablet Take 1 tablet (8 mg total) by mouth every 8 (eight) hours as needed for nausea or vomiting. Start on the third day after cisplatin. (Patient not taking: Reported on 05/22/2023)  30 tablet 1   oxyCODONE (OXY IR/ROXICODONE) 5 MG immediate release tablet Take 1 tablet (5 mg total) by mouth every 12 (twelve) hours as needed for severe pain. 20 tablet 0   prochlorperazine (COMPAZINE) 10 MG tablet Take 1 tablet (10 mg total) by mouth every 6 (six) hours as needed (Nausea or vomiting). (Patient not taking: Reported on 05/22/2023) 30 tablet 1   tamsulosin (FLOMAX) 0.4 MG CAPS capsule      No current facility-administered medications for this visit.     PHYSICAL EXAMINATION: ECOG PERFORMANCE STATUS: 0 - Asymptomatic  Vitals:   06/05/23 0944  BP: 135/75  Pulse: 69  Resp: 18  Temp: 97.9 F (36.6 C)  SpO2: 96%    Filed Weights   06/05/23 0944  Weight: 188 lb 9.6 oz (85.5 kg)    Physical Exam Constitutional:      Appearance: Normal appearance.  Cardiovascular:     Rate and Rhythm: Normal rate and regular rhythm.     Pulses: Normal pulses.     Heart sounds: Normal heart sounds.  Pulmonary:     Effort: Pulmonary effort is normal.     Breath sounds: Normal  breath sounds.  Musculoskeletal:        General: Normal range of motion.     Cervical back: Normal range of motion. No rigidity.  Lymphadenopathy:     Cervical: No cervical adenopathy.  Skin:    General: Skin is warm and dry.  Neurological:     General: No focal deficit present.     Mental Status: He is alert.      LABORATORY DATA:  I have reviewed the data as listed Lab Results  Component Value Date   WBC 7.7 06/05/2023   HGB 14.0 06/05/2023   HCT 40.5 06/05/2023   MCV 96.7 06/05/2023   PLT 161 06/05/2023     Chemistry      Component Value Date/Time   NA 136 05/29/2023 0835   K 3.8 05/29/2023 0835   CL 105 05/29/2023 0835   CO2 27 05/29/2023 0835   BUN 20 05/29/2023 0835   CREATININE 0.90 05/29/2023 0835      Component Value Date/Time   CALCIUM 8.6 (L) 05/29/2023 0835   ALKPHOS 63 03/06/2023 1255   AST 13 (L) 03/06/2023 1255   ALT 8 03/06/2023 1255   BILITOT 0.5 03/06/2023 1255       RADIOGRAPHIC STUDIES: I have personally reviewed the radiological images as listed and agreed with the findings in the report. IR Gastrostomy Tube  Result Date: 05/28/2023 INDICATION: Head and neck cancer, malnutrition need for long-term enteric access EXAM: Placement of percutaneous gastrostomy tube using fluoroscopic guidance MEDICATIONS: Documented in the EMR ANESTHESIA/SEDATION: Moderate (conscious) sedation was employed during this procedure. A total of Versed 1 mg and Fentanyl 50 mcg was administered intravenously by the radiology nurse. Total intra-service moderate Sedation Time: 19 minutes. The patient's level of consciousness and vital signs were monitored continuously by radiology nursing throughout the procedure under my direct supervision. CONTRAST:  30 mL Omnipaque 300-administered into the gastric lumen. FLUOROSCOPY: Radiation Exposure Index (as provided by the fluoroscopic device): 3.8 minutes (42 mGy) COMPLICATIONS: None immediate. PROCEDURE: Informed written consent was  obtained from the patient after a thorough discussion of the procedural risks, benefits and alternatives. All questions were addressed. Maximal Sterile Barrier Technique was utilized including caps, mask, sterile gowns, sterile gloves, sterile drape, hand hygiene and skin antiseptic. A timeout was performed prior to the initiation of the procedure.  Review of pre-procedure CT scan demonstrates an adequate window for percutaneous placement of a gastrostomy tube. The patient was placed supine on the exam table. Using fluoroscopic guidance, an angled 5 French catheter was passed through the nares into the stomach. It was secured to the nose, and the stomach was insufflated with air. The abdomen was prepped and draped in the standard sterile fashion. After insufflating the stomach with air, puncture sites were selected and local analgesia was obtained with 1% lidocaine. Using fluoroscopic guidance, a gastropexy needle was advanced into the stomach and the T-bar suture was released. Entry into the stomach was confirmed with fluoroscopy, aspiration of air, and injection of contrast material. This was repeated with an additional gastropexy suture (for a total of 2 fasteners). At the center of these gastropexy sutures, a dermatotomy was performed. An 18 gauge needle was then passed into the stomach, and position within the gastric lumen again confirmed under fluoroscopy using aspiration of air and injection of contrast material. An Amplatz guidewire was passed through this needle and intraluminal placement was confirmed with fluoroscopy. The needle was removed, and over the guidewire, a 20 French balloon gastrostomy tube with a coaxial 10 mm balloon was advanced into the percutaneous tract. Dilation of the percutaneous tract was then performed using the balloon, followed by advancement of the gastrostomy tube into the gastric lumen. The retention balloon was then inflated with 20 mL of sterile water, and the tube was brought  back to the gastric wall. The wire and balloon were removed. The external bumper was brought to the skin. Location of the gastrostomy tube within the stomach was then confirmed with injection of contrast material, opacifying the gastric lumen. The gastrostomy tube was flushed with sterile water, and secured to the skin using a dressing. The patient tolerated the procedure well without immediate complication, and was transferred to recovery in stable condition. IMPRESSION: Successful placement of a percutaneous 20 French balloon retention gastrostomy tube using fluoroscopic guidance. Gastrostomy tube may be used after 4 hours. Electronically Signed   By: Olive Bass M.D.   On: 05/28/2023 12:02   IR IMAGING GUIDED PORT INSERTION  Result Date: 05/28/2023 INDICATION: Head and neck cancer, need for chemotherapy access EXAM: Chest port placement using ultrasound and fluoroscopic guidance MEDICATIONS: Documented in the EMR ANESTHESIA/SEDATION: Moderate (conscious) sedation was employed during this procedure. A total of Versed 3 mg and Fentanyl 150 mcg was administered intravenously. Moderate Sedation Time: 30 minutes. The patient's level of consciousness and vital signs were monitored continuously by radiology nursing throughout the procedure under my direct supervision. FLUOROSCOPY TIME:  Fluoroscopy Time: 0.5 minutes (1 mGy) COMPLICATIONS: None immediate. PROCEDURE: Informed written consent was obtained from the patient after a thorough discussion of the procedural risks, benefits and alternatives. All questions were addressed. Maximal Sterile Barrier Technique was utilized including caps, mask, sterile gowns, sterile gloves, sterile drape, hand hygiene and skin antiseptic. A timeout was performed prior to the initiation of the procedure. The patient was placed supine on the exam table. The right neck and chest was prepped and draped in the standard sterile fashion. A preliminary ultrasound of the right neck was  performed and demonstrates a patent right internal jugular vein. A permanent ultrasound image was stored in the electronic medical record. The overlying skin was anesthetized with 1% Lidocaine. Using ultrasound guidance, access was obtained into the right internal jugular vein using a 21 gauge micropuncture set. A wire was advanced into the SVC, a short incision was made at  the puncture site, and serial dilatation performed. Next, in an ipsilateral infraclavicular location, an incision was made at the site of the subcutaneous reservoir. Blunt dissection was used to open a pocket to contain the reservoir. A subcutaneous tunnel was then created from the port site to the puncture site. A(n) 8 Fr single lumen catheter was advanced through the tunnel. The catheter was attached to the port and this was placed in the subcutaneous pocket. Under fluoroscopic guidance, a peel away sheath was placed, and the catheter was trimmed to the appropriate length and was advanced into the central veins. The catheter length is 27 cm. The tip of the catheter lies near the superior cavoatrial junction. The port flushes and aspirates appropriately. The port was flushed and locked with heparinized saline. The port pocket was closed in 2 layers using 3-0 and 4-0 Vicryl/absorbable suture. Dermabond was also applied to both incisions. The patient tolerated the procedure well and was transferred to recovery in stable condition. IMPRESSION: Successful placement of a right-sided chest port via the right internal jugular vein. The port is ready for immediate use. Electronically Signed   By: Olive Bass M.D.   On: 05/28/2023 11:55   CT Super D Chest Wo Contrast  Addendum Date: 05/22/2023   ADDENDUM REPORT: 05/22/2023 14:43 ADDENDUM: The original report was by Dr. Gaylyn Rong. The following addendum is by Dr. Gaylyn Rong: For my original exam, I compared to the CT images PET-CT from 03/12/2023. There is also an outside exam from  [facility] dated 02/02/2023, I measure the ascending thoracic aortic aneurysm at 4.3 cm on that exam which is essentially stable from the current measurement. This can likely be surveilled in the context of the patient's surveillance oncology imaging. If the chest would not be imaged on surveillance oncology imaging, then I recommend annual imaging followup by CTA or MRA. This recommendation follows 2010 ACCF/AHA/AATS/ACR/ASA/SCA/SCAI/SIR/STS/SVM Guidelines for the Diagnosis and Management of Patients with Thoracic Aortic Disease. Circulation. 2010; 121: Z610-R604. Aortic aneurysm NOS (ICD10-I71.9) Electronically Signed   By: Gaylyn Rong M.D.   On: 05/22/2023 14:43   Result Date: 05/22/2023 CLINICAL DATA:  Tonsillar cancer.  Lung nodule. EXAM: CT CHEST WITHOUT CONTRAST TECHNIQUE: Multidetector CT imaging of the chest was performed using thin slice collimation for electromagnetic bronchoscopy planning purposes, without intravenous contrast. RADIATION DOSE REDUCTION: This exam was performed according to the departmental dose-optimization program which includes automated exposure control, adjustment of the mA and/or kV according to patient size and/or use of iterative reconstruction technique. COMPARISON:  PET-CT 03/12/2023 FINDINGS: Cardiovascular: Coronary, aortic arch, and branch vessel atherosclerotic vascular disease. Ascending thoracic aortic aneurysm 4.2 cm in diameter. Mediastinum/Nodes: 1.0 cm right lower paratracheal node, image 76 series 2. Lungs/Pleura: Previously hypermetabolic and cavitary right upper lobe nodule 1.4 by 0.8 cm on image 53 series 4, previously 1.3 by 0.8 cm by my measurement on 03/12/2023. Prominent centrilobular emphysema. Upper Abdomen: Hepatic and left kidney upper pole cysts. Musculoskeletal: Unremarkable IMPRESSION: 1. Previously hypermetabolic and cavitary right upper lobe nodule is similar in size to the prior PET-CT from 03/12/2023. 2. Borderline enlarged right  lower paratracheal lymph node. 3. Aortic atherosclerosis. Ascending thoracic aortic aneurysm 4.2 cm in diameter. This can likely be followed in the context of the patient's anticipated follow up thoracic oncology imaging. 4. Emphysema. Aortic Atherosclerosis (ICD10-I70.0) and Emphysema (ICD10-J43.9). Electronically Signed: By: Gaylyn Rong M.D. On: 05/18/2023 16:34    All questions were answered. The patient knows to call the clinic with any problems, questions  or concerns.  I spent 30 minutes in the care of this patientincluding H and P, review of records, counseling and coordination of care.     Rachel Moulds, MD 06/05/2023 10:06 AM

## 2023-06-05 NOTE — Patient Instructions (Signed)
[facility]  Discharge Instructions: Thank you for choosing [facility] to provide your oncology and hematology care.   If you have a lab appointment with the Cancer Center, please go directly to the Cancer Center and check in at the registration area.   Wear comfortable clothing and clothing appropriate for easy access to any Portacath or PICC line.   We strive to give you quality time with your provider. You may need to reschedule your appointment if you arrive late (15 or more minutes).  Arriving late affects you and other patients whose appointments are after yours.  Also, if you miss three or more appointments without notifying the office, you may be dismissed from the clinic at the provider's discretion.      For prescription refill requests, have your pharmacy contact our office and allow 72 hours for refills to be completed.    Today you received the following chemotherapy and/or immunotherapy agent: Cisplatin   To help prevent nausea and vomiting after your treatment, we encourage you to take your nausea medication as directed.  BELOW ARE SYMPTOMS THAT SHOULD BE REPORTED IMMEDIATELY: *FEVER GREATER THAN 100.4 F (38 C) OR HIGHER *CHILLS OR SWEATING *NAUSEA AND VOMITING THAT IS NOT CONTROLLED WITH YOUR NAUSEA MEDICATION *UNUSUAL SHORTNESS OF BREATH *UNUSUAL BRUISING OR BLEEDING *URINARY PROBLEMS (pain or burning when urinating, or frequent urination) *BOWEL PROBLEMS (unusual diarrhea, constipation, pain near the anus) TENDERNESS IN MOUTH AND THROAT WITH OR WITHOUT PRESENCE OF ULCERS (sore throat, sores in mouth, or a toothache) UNUSUAL RASH, SWELLING OR PAIN  UNUSUAL VAGINAL DISCHARGE OR ITCHING   Items with * indicate a potential emergency and should be followed up as soon as possible or go to the Emergency Department if any problems should occur.  Please show the CHEMOTHERAPY ALERT CARD or IMMUNOTHERAPY ALERT CARD at check-in  to the Emergency Department and triage nurse.  Should you have questions after your visit or need to cancel or reschedule your appointment, please contact [facility]  Dept: [phone]  and follow the prompts.  Office hours are 8:00 a.m. to 4:30 p.m. Monday - Friday. Please note that voicemails left after 4:00 p.m. may not be returned until the following business day.  We are closed weekends and major holidays. You have access to a nurse at all times for urgent questions. Please call the main number to the clinic Dept: [phone] and follow the prompts.   For any non-urgent questions, you may also contact your provider using MyChart. We now offer e-Visits for anyone 68 and older to request care online for non-urgent symptoms. For details visit [email or web address].   Also download the MyChart app! Go to the app store, search "MyChart", open the app, select , and log in with your MyChart username and password.

## 2023-06-05 NOTE — Progress Notes (Signed)
Nutrition Follow-up:  Patient with SCC of right tonsil, p16 positive s/p TORS resection under the care of Dr. Hezzie Bump 04/06/23. He is receiving concurrent chemoradiation with weekly cisplatin (first 5/28)  S/p PEG 6/3  Met with patient in infusion. He reports unable to taste foods, but continues eating. His throat has become increasingly sore over the weekend. Yesterday he had 2 eggs, bowl of oatmeal for breakfast, cheese sandwich and beanie weenies for lunch, baked chicken, beans, potatoes, fried squash, and cornbread for supper. He is drinking 2 Boost. Patient recalls drinking an easy 60 ounces of water. Patient is flushing tube. Patient reports constipation has resolved s/p enema at home. He is agreeable to practicing bolus feed this afternoon. His wife has gone to pick up lunch. RD encouraged patient to eat orally with plans to return later in infusion for bolus education.   Medications: reviewed   Labs: BUN 25  Anthropometrics: Wt 188 lb 9.6 oz today decreased 3% in one week - this is severe for time frame  6/4 - 194 lb 4.8 oz  5/28 - 196 lb 3.2 oz    Estimated Energy Needs  Kcals: 2300-2600 Protein: 107-130 Fluid: >/= 2.3 L  NUTRITION DIAGNOSIS: Predicted suboptimal intake continues     INTERVENTION:  Encouraged continued oral intake as tolerated Bowel regimen per MD Continue drinking 2 Boost for added calories and protein  Encouraged baking soda salt water rinses several times daily and before meals RD provided bolus education with patient and wife in infusion. Patient reports planning to eat BBQ after treatment and did not want to be full. He tolerated 1/2 carton of Osmolite with 60 ml water flush before and after. Wife provided bolus. Teach back method used.  Encouraged patient to start supplementing with tube feedings given wt loss    MONITORING, EVALUATION, GOAL: weight trends, intake, TF   NEXT VISIT: Tuesday June 18 during infusion

## 2023-06-06 ENCOUNTER — Ambulatory Visit
Admission: RE | Admit: 2023-06-06 | Discharge: 2023-06-06 | Disposition: A | Discharge status: Home / Self Care | Payer: Medicare Other | Source: Ambulatory Visit | Attending: Radiation Oncology | Admitting: Radiation Oncology

## 2023-06-06 ENCOUNTER — Other Ambulatory Visit: Payer: Self-pay

## 2023-06-06 DIAGNOSIS — R350 Frequency of micturition: Secondary | ICD-10-CM | POA: Diagnosis not present

## 2023-06-06 LAB — RAD ONC ARIA SESSION SUMMARY
Course Elapsed Days: 14
Plan Fractions Treated to Date: 10
Plan Prescribed Dose Per Fraction: 2 Gy
Plan Total Fractions Prescribed: 30
Plan Total Prescribed Dose: 60 Gy
Reference Point Dosage Given to Date: 20 Gy
Reference Point Session Dosage Given: 2 Gy
Session Number: 10

## 2023-06-07 ENCOUNTER — Other Ambulatory Visit: Payer: Self-pay

## 2023-06-07 ENCOUNTER — Ambulatory Visit
Admission: RE | Admit: 2023-06-07 | Discharge: 2023-06-07 | Disposition: A | Discharge status: Home / Self Care | Payer: Medicare Other | Source: Ambulatory Visit | Attending: Radiation Oncology | Admitting: Radiation Oncology

## 2023-06-07 DIAGNOSIS — R350 Frequency of micturition: Secondary | ICD-10-CM | POA: Diagnosis not present

## 2023-06-07 LAB — RAD ONC ARIA SESSION SUMMARY
Course Elapsed Days: 15
Plan Fractions Treated to Date: 11
Plan Prescribed Dose Per Fraction: 2 Gy
Plan Total Fractions Prescribed: 30
Plan Total Prescribed Dose: 60 Gy
Reference Point Dosage Given to Date: 22 Gy
Reference Point Session Dosage Given: 2 Gy
Session Number: 11

## 2023-06-08 ENCOUNTER — Other Ambulatory Visit: Payer: Self-pay

## 2023-06-08 ENCOUNTER — Ambulatory Visit
Admission: RE | Admit: 2023-06-08 | Discharge: 2023-06-08 | Disposition: A | Discharge status: Home / Self Care | Payer: Medicare Other | Source: Ambulatory Visit | Attending: Radiation Oncology | Admitting: Radiation Oncology

## 2023-06-08 DIAGNOSIS — R350 Frequency of micturition: Secondary | ICD-10-CM | POA: Diagnosis not present

## 2023-06-08 LAB — RAD ONC ARIA SESSION SUMMARY
Course Elapsed Days: 16
Plan Fractions Treated to Date: 12
Plan Prescribed Dose Per Fraction: 2 Gy
Plan Total Fractions Prescribed: 30
Plan Total Prescribed Dose: 60 Gy
Reference Point Dosage Given to Date: 24 Gy
Reference Point Session Dosage Given: 2 Gy
Session Number: 12

## 2023-06-11 ENCOUNTER — Inpatient Hospital Stay (HOSPITAL_BASED_OUTPATIENT_CLINIC_OR_DEPARTMENT_OTHER): Payer: Medicare Other | Admitting: Physician Assistant

## 2023-06-11 ENCOUNTER — Ambulatory Visit
Admission: RE | Admit: 2023-06-11 | Discharge: 2023-06-11 | Disposition: A | Discharge status: Home / Self Care | Payer: Medicare Other | Source: Ambulatory Visit | Attending: Radiation Oncology | Admitting: Radiation Oncology

## 2023-06-11 ENCOUNTER — Other Ambulatory Visit: Payer: Self-pay

## 2023-06-11 VITALS — BP 121/67 | HR 65 | Temp 97.6°F | Resp 18

## 2023-06-11 DIAGNOSIS — C090 Malignant neoplasm of tonsillar fossa: Secondary | ICD-10-CM | POA: Diagnosis not present

## 2023-06-11 DIAGNOSIS — R350 Frequency of micturition: Secondary | ICD-10-CM

## 2023-06-11 DIAGNOSIS — K9422 Gastrostomy infection: Secondary | ICD-10-CM

## 2023-06-11 LAB — CBC WITH DIFFERENTIAL (CANCER CENTER ONLY)
Abs Immature Granulocytes: 0.03 10*3/uL (ref 0.00–0.07)
Basophils Absolute: 0 10*3/uL (ref 0.0–0.1)
Basophils Relative: 0 %
Eosinophils Absolute: 0 10*3/uL (ref 0.0–0.5)
Eosinophils Relative: 1 %
HCT: 43.9 % (ref 39.0–52.0)
Hemoglobin: 15.2 g/dL (ref 13.0–17.0)
Immature Granulocytes: 1 %
Lymphocytes Relative: 9 %
Lymphs Abs: 0.5 10*3/uL — ABNORMAL LOW (ref 0.7–4.0)
MCH: 33.6 pg (ref 26.0–34.0)
MCHC: 34.6 g/dL (ref 30.0–36.0)
MCV: 97.1 fL (ref 80.0–100.0)
Monocytes Absolute: 0.5 10*3/uL (ref 0.1–1.0)
Monocytes Relative: 8 %
Neutro Abs: 5 10*3/uL (ref 1.7–7.7)
Neutrophils Relative %: 81 %
Platelet Count: 153 10*3/uL (ref 150–400)
RBC: 4.52 MIL/uL (ref 4.22–5.81)
RDW: 12.3 % (ref 11.5–15.5)
WBC Count: 6 10*3/uL (ref 4.0–10.5)
nRBC: 0 % (ref 0.0–0.2)

## 2023-06-11 LAB — BASIC METABOLIC PANEL - CANCER CENTER ONLY
Anion gap: 5 (ref 5–15)
BUN: 20 mg/dL (ref 8–23)
CO2: 31 mmol/L (ref 22–32)
Calcium: 9.6 mg/dL (ref 8.9–10.3)
Chloride: 99 mmol/L (ref 98–111)
Creatinine: 1.01 mg/dL (ref 0.61–1.24)
GFR, Estimated: >60 mL/min (ref >60)
Glucose, Bld: 93 mg/dL (ref 70–99)
Potassium: 4.3 mmol/L (ref 3.5–5.1)
Sodium: 135 mmol/L (ref 135–145)

## 2023-06-11 LAB — URINALYSIS, COMPLETE (UACMP) WITH MICROSCOPIC
Bacteria, UA: NONE SEEN
Bilirubin Urine: NEGATIVE
Glucose, UA: NEGATIVE mg/dL
Hgb urine dipstick: NEGATIVE
Ketones, ur: NEGATIVE mg/dL
Leukocytes,Ua: NEGATIVE
Nitrite: NEGATIVE
Protein, ur: NEGATIVE mg/dL
Specific Gravity, Urine: 1.013 (ref 1.005–1.030)
pH: 6 (ref 5.0–8.0)

## 2023-06-11 LAB — RAD ONC ARIA SESSION SUMMARY
Course Elapsed Days: 19
Plan Fractions Treated to Date: 13
Plan Prescribed Dose Per Fraction: 2 Gy
Plan Total Fractions Prescribed: 30
Plan Total Prescribed Dose: 60 Gy
Reference Point Dosage Given to Date: 26 Gy
Reference Point Session Dosage Given: 2 Gy
Session Number: 13

## 2023-06-11 LAB — MAGNESIUM: Magnesium: 1.7 mg/dL (ref 1.7–2.4)

## 2023-06-11 MED ORDER — SULFAMETHOXAZOLE-TRIMETHOPRIM 800-160 MG PO TABS: 1.0000 | ORAL_TABLET | Freq: Two times a day (BID) | ORAL | 0 refills | Status: AC

## 2023-06-11 MED ORDER — CEPHALEXIN 500 MG PO CAPS: 500.0000 mg | ORAL_CAPSULE | Freq: Three times a day (TID) | ORAL | 0 refills | Status: DC

## 2023-06-11 MED FILL — Fosaprepitant Dimeglumine For IV Infusion 150 MG (Base Eq): INTRAVENOUS | Qty: 5 | Status: AC

## 2023-06-11 MED FILL — Dexamethasone Sodium Phosphate Inj 100 MG/10ML: INTRAMUSCULAR | Qty: 1 | Status: AC

## 2023-06-11 NOTE — Progress Notes (Signed)
Oncology Nurse Navigator Documentation   I met with Ethan [name] and his wife today. He reports green drainage from his PEG along with an area that is firm to touch and pain. He is changing his PEG dressing 2-3 times daily. I planned to remove the two buttons that were in place from the PEG insertion which I did without difficulty. The site has greenish/brownish drainage present and is red around the stoma site. I placed another drainage sponge after removing the buttons. Zerita Boers contacted symptom management for evaluation of his PEG and they will see him this morning. He was also sent to lab to have a urinalysis and culture done due to his reports of urinary frequency and burning. [name] and his wife know to call me if they have any needs or questions.   Hedda Slade RN, BSN, OCN Head & Neck Oncology Nurse Navigator [facility] Phone # [phone]  Fax # [phone]

## 2023-06-11 NOTE — Progress Notes (Signed)
Symptom Management Consult Note [facility]    Patient Care Team: Practice, [facility] as PCP - General Lonie Peak, MD as Attending Physician (Radiation Oncology) Malmfelt, Lise Auer, RN as Registered Nurse Rachel Moulds, MD as Consulting Physician (Hematology and Oncology) Corey Skains, MD as Referring Physician (Otolaryngology) Noreene Larsson, RD as Dietitian (Dietician) Richrd Humbles (Dentistry) Christia Reading, MD as Consulting Physician (Otolaryngology)    Name / MRN / DOB: Ethan [name]  [id number]  [birth year]   Date of visit: 06/11/2023   Chief Complaint/Reason for visit: PEG tube infection, painful urination    Current Therapy: Cisplatin  Last treatment:  Day 1   Cycle 3 on 06/05/23   ASSESSMENT & PLAN: Patient is a 72 y.o. 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 Al Pimple.  I have viewed most recent oncology note and lab work.    #Squamous cell carcinoma of the base of the tongue  - Next appointment with oncologist is 06/12/23   #PEG tube infection -Patient nontoxic appearing, afebrile, HDS. -Exam with purulent drainage around PEG tube. No palpable abscess.  -Bactrim and keflex prescribed. Patient aware if no improvement with antibiotics he will need to RTC for recheck.  #Urinary frequency -No CVA tenderness. UA negative for infection. Will follow up on culture results. -Antibiotics from peg tube infection will also give UTI coverage incase this is a developing infection. -Patient has history of kidney stones and this does not feel similar.   Strict ED precautions discussed should symptoms worsen.    Heme/Onc History: Oncology History  Cancer of tonsillar fossa (HCC)  02/07/2023 Initial Biopsy   Biopsy of the right tonsil  revealed: squamous cell carcinoma, p16 positive.    03/12/2023 PET scan   Right tonsillar mass as tracer avid and concerning for  malignancy; hypermetabolic right level 2 and level 3 cervical lymph nodes compatible with metastatic adenopathy; indeterminate tracer avid right upper lobe elongated area of increased soft tissue thickening within the medial right upper lobe (SUV max of 5.08); and nonspecific mild tracer avid left hilar lymph nodes.    03/20/2023 Miscellaneous   Dentistry evaluation: Extractions of #18 and #19 with Dr. Azucena Cecil   04/06/2023 Surgery   TORS resection (Dr. Hezzie Bump): 2.7 cm tumor, histology of nonkeratinizing squamous cell carcinoma with focal maturation, HPV associated; + LVI and PNI; all margins negative.  8/12 + LN including right level 2A, 3, and 4 being +.  Extranodal extension present, largest tumor deposit 2.6cm.     04/27/2023 Cancer Staging   Staging form: Pharynx - HPV-Mediated Oropharynx, AJCC 8th Edition - Pathologic stage from 04/27/2023: Stage II (pT2, pN2, cM0, p16+) - Signed by Lonie Peak, MD on 04/27/2023 Stage prefix: Initial diagnosis   05/22/2023 -  Chemotherapy   Patient is on Treatment Plan : HEAD/NECK Cisplatin (40) q7d     05/23/2023 - 07/04/2023 Radiation Therapy   Concurrent chemoradiation       Interval history-: Ethan [name] is a 72 y.o. male with oncologic history as above presenting to [facility] today with chief complaint of concern of PEG tube infection.  He is accompanied by spouse who provides additional history.  Patient reports approximately 1 week ago the skin around his PEG tube seemed more red than usual and had some clear drainage.  Patient states x 3 days ago he started to have cream and green drainage from the site.  He denies pain  associated with the drainage.  He has not had any fevers or chills.  Patient has been active, mowing the lawn recently.  Denies any abdominal trauma.  He is tolerating tube feeds without difficulty.  Patient is also endorsing dysuria x 3 days.  He has a history of UTIs and this feels similar.  He saw the radiation oncology team  downstairs and a urinalysis test was ordered.  Patient has no associated nausea or vomiting.  Denies any back pain.  No over-the-counter medications taken prior to arrival.      ROS  All other systems are reviewed and are negative for acute change except as noted in the HPI.    No Known Allergies   Past Medical History:  Diagnosis Date   AAA (abdominal aortic aneurysm) (HCC)    Hyperlipidemia    Kidney stones    Renal disorder      Past Surgical History:  Procedure Laterality Date   ABDOMINAL AORTIC ANEURYSM REPAIR  06/15/2011   EVAR   CYSTOSCOPY WITH RETROGRADE PYELOGRAM, URETEROSCOPY AND STENT PLACEMENT Right 03/11/2015   Procedure: CYSTOSCOPY WITH RETROGRADE PYELOGRAM, AND RIGHT URETERAL STENT PLACEMENT;  Surgeon: Barron Alvine, MD;  Location: [facility];  Service: Urology;  Laterality: Right;   HERNIA REPAIR     IR GASTROSTOMY TUBE MOD SED  05/28/2023   IR IMAGING GUIDED PORT INSERTION  05/28/2023   LOWER BACK FUSION, L2-L3,AND L3-L4     RIGHT LOWER TIB-FEM FRACTURE     SHOULDER SURGERY Bilateral    SPINE SURGERY     THROAT SURGERY  04/06/2023    Social History   Socioeconomic History   Marital status: Married    Spouse name: Not on file   Number of children: Not on file   Years of education: Not on file   Highest education level: Not on file  Occupational History   Not on file  Tobacco Use   Smoking status: Some Days    Packs/day: .5    Types: Cigarettes   Smokeless tobacco: Never  Vaping Use   Vaping Use: Never used  Substance and Sexual Activity   Alcohol use: Yes    Alcohol/week: 0.0 standard drinks of alcohol    Comment: occ   Drug use: No   Sexual activity: Not Currently  Other Topics Concern   Not on file  Social History Narrative   Not on file   Social Determinants of Health   Financial Resource Strain: Not on file  Food Insecurity: No Food Insecurity (04/26/2023)   Hunger Vital Sign    Worried About Running Out of Food in the Last Year: Never  true    Ran Out of Food in the Last Year: Never true  Transportation Needs: No Transportation Needs (04/26/2023)   PRAPARE - Administrator, Civil Service (Medical): No    Lack of Transportation (Non-Medical): No  Physical Activity: Not on file  Stress: Not on file  Social Connections: Not on file  Intimate Partner Violence: Not At Risk (04/26/2023)   Humiliation, Afraid, Rape, and Kick questionnaire    Fear of Current or Ex-Partner: No    Emotionally Abused: No    Physically Abused: No    Sexually Abused: No    Family History  Problem Relation Age of Onset   Aneurysm Father    Stroke Mother    Heart disease Mother        Aneurysm   Hypertension Sister    Alcohol abuse Brother  Cancer Brother        mouth cancer   Aneurysm Paternal Uncle      Current Outpatient Medications:    cephALEXin (KEFLEX) 500 MG capsule, Take 1 capsule (500 mg total) by mouth 3 (three) times daily for 7 days., Disp: 21 capsule, Rfl: 0   sulfamethoxazole-trimethoprim (BACTRIM DS) 800-160 MG tablet, Take 1 tablet by mouth 2 (two) times daily for 7 days., Disp: 14 tablet, Rfl: 0   augmented betamethasone dipropionate (DIPROLENE-AF) 0.05 % cream, APPLY TO RASH ON THE SKIN TWICE DAILY FOR 2 3 WEEKS., Disp: , Rfl:    dexamethasone (DECADRON) 4 MG tablet, Take 2 tablets (8 mg) by mouth daily x 3 days starting the day after cisplatin chemotherapy. Take with food. (Patient not taking: Reported on 05/22/2023), Disp: 30 tablet, Rfl: 1   lidocaine (XYLOCAINE) 2 % solution, Use as directed 15 mLs in the mouth or throat as needed for mouth pain., Disp: 100 mL, Rfl: 0   lidocaine-prilocaine (EMLA) cream, Apply to affected area once (Patient not taking: Reported on 05/22/2023), Disp: 30 g, Rfl: 3   ondansetron (ZOFRAN) 8 MG tablet, Take 1 tablet (8 mg total) by mouth every 8 (eight) hours as needed for nausea or vomiting. Start on the third day after cisplatin. (Patient not taking: Reported on 05/22/2023), Disp:  30 tablet, Rfl: 1   oxyCODONE (OXY IR/ROXICODONE) 5 MG immediate release tablet, Take 1 tablet (5 mg total) by mouth every 12 (twelve) hours as needed for severe pain., Disp: 20 tablet, Rfl: 0   prochlorperazine (COMPAZINE) 10 MG tablet, Take 1 tablet (10 mg total) by mouth every 6 (six) hours as needed (Nausea or vomiting). (Patient not taking: Reported on 05/22/2023), Disp: 30 tablet, Rfl: 1   tamsulosin (FLOMAX) 0.4 MG CAPS capsule, , Disp: , Rfl:   PHYSICAL EXAM: ECOG FS:1 - Symptomatic but completely ambulatory    Vitals:   06/11/23 1155  BP: 121/67  Pulse: 65  Resp: 18  Temp: 97.6 F (36.4 C)  TempSrc: Oral  SpO2: 99%   Physical Exam Vitals and nursing note reviewed.  Constitutional:      Appearance: He is not ill-appearing or toxic-appearing.     Comments: Thin male  HENT:     Head: Normocephalic.  Eyes:     Conjunctiva/sclera: Conjunctivae normal.  Cardiovascular:     Rate and Rhythm: Normal rate and regular rhythm.     Pulses: Normal pulses.     Heart sounds: Normal heart sounds.  Pulmonary:     Effort: Pulmonary effort is normal.     Breath sounds: Normal breath sounds.  Abdominal:     General: There is no distension.     Palpations: Abdomen is soft.     Tenderness: There is no abdominal tenderness. There is no right CVA tenderness or left CVA tenderness.     Comments: PEG tube with purulent drainage. No surrounding tenderness or palpable fluctuance.   Musculoskeletal:     Cervical back: Normal range of motion.  Skin:    General: Skin is warm and dry.  Neurological:     Mental Status: He is alert.        LABORATORY DATA: I have reviewed the data as listed    Latest Ref Rng & Units 06/11/2023   11:33 AM 06/05/2023    9:39 AM 05/29/2023    8:35 AM  CBC  WBC 4.0 - 10.5 K/uL 6.0  7.7  8.4   Hemoglobin 13.0 - 17.0 g/dL 15.2  14.0  14.8   Hematocrit 39.0 - 52.0 % 43.9  40.5  42.1   Platelets 150 - 400 K/uL 153  161  181         Latest Ref Rng & Units  06/11/2023   11:33 AM 06/05/2023    9:39 AM 05/29/2023    8:35 AM  CMP  Glucose 70 - 99 mg/dL 93  86  161   BUN 8 - 23 mg/dL 20  25  20    Creatinine 0.61 - 1.24 mg/dL 0.96  0.45  4.09   Sodium 135 - 145 mmol/L 135  137  136   Potassium 3.5 - 5.1 mmol/L 4.3  3.9  3.8   Chloride 98 - 111 mmol/L 99  104  105   CO2 22 - 32 mmol/L 31  27  27    Calcium 8.9 - 10.3 mg/dL 9.6  8.8  8.6        RADIOGRAPHIC STUDIES (from last 24 hours if applicable) I have personally reviewed the radiological images as listed and agreed with the findings in the report. No results found.      Visit Diagnosis: 1. Cancer of tonsillar fossa (HCC)   2. Frequent urination   3. 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   All questions were answered. The patient knows to call the clinic with any problems, questions or concerns. No barriers to learning was detected.  A total of more than 30 minutes were spent on this encounter with face-to-face time and non-face-to-face time, including preparing to see the patient, ordering medications, counseling the patient and coordination of care as outlined above.    Thank you for allowing me to participate in the care of this patient.    Shanon Ace, PA-C Department of Hematology/Oncology [facility] Phone: [phone]  [phone]    06/11/2023 4:31 PM

## 2023-06-12 ENCOUNTER — Encounter: Payer: Self-pay | Admitting: Adult Health

## 2023-06-12 ENCOUNTER — Inpatient Hospital Stay: Payer: Medicare Other

## 2023-06-12 ENCOUNTER — Inpatient Hospital Stay: Payer: Medicare Other | Admitting: Dietician

## 2023-06-12 ENCOUNTER — Encounter: Payer: Self-pay | Admitting: Hematology and Oncology

## 2023-06-12 ENCOUNTER — Other Ambulatory Visit: Payer: Self-pay

## 2023-06-12 ENCOUNTER — Inpatient Hospital Stay: Payer: Medicare Other | Admitting: Adult Health

## 2023-06-12 ENCOUNTER — Ambulatory Visit
Admission: RE | Admit: 2023-06-12 | Discharge: 2023-06-12 | Disposition: A | Discharge status: Home / Self Care | Payer: Medicare Other | Source: Ambulatory Visit | Attending: Radiation Oncology | Admitting: Radiation Oncology

## 2023-06-12 ENCOUNTER — Telehealth: Payer: Self-pay

## 2023-06-12 VITALS — BP 117/64 | HR 70 | Resp 17

## 2023-06-12 DIAGNOSIS — C090 Malignant neoplasm of tonsillar fossa: Secondary | ICD-10-CM | POA: Diagnosis not present

## 2023-06-12 DIAGNOSIS — R350 Frequency of micturition: Secondary | ICD-10-CM | POA: Diagnosis not present

## 2023-06-12 LAB — RAD ONC ARIA SESSION SUMMARY
Course Elapsed Days: 20
Plan Fractions Treated to Date: 14
Plan Prescribed Dose Per Fraction: 2 Gy
Plan Total Fractions Prescribed: 30
Plan Total Prescribed Dose: 60 Gy
Reference Point Dosage Given to Date: 28 Gy
Reference Point Session Dosage Given: 2 Gy
Session Number: 14

## 2023-06-12 LAB — URINE CULTURE: Culture: NO GROWTH

## 2023-06-12 MED ORDER — SODIUM CHLORIDE 0.9 % IV SOLN
150.0000 mg | Freq: Once | INTRAVENOUS | Status: AC
  Administered 2023-06-12: 150 mg via INTRAVENOUS
  Filled 2023-06-12: qty 150

## 2023-06-12 MED ORDER — OSMOLITE 1.5 CAL PO LIQD: ORAL | 0 refills | Status: DC

## 2023-06-12 MED ORDER — POTASSIUM CHLORIDE IN NACL 20-0.9 MEQ/L-% IV SOLN
Freq: Once | INTRAVENOUS | Status: AC
  Filled 2023-06-12: qty 1000

## 2023-06-12 MED ORDER — HEPARIN SOD (PORK) LOCK FLUSH 100 UNIT/ML IV SOLN
500.0000 [IU] | Freq: Once | INTRAVENOUS | Status: AC | PRN
  Administered 2023-06-12: 500 [IU]

## 2023-06-12 MED ORDER — PALONOSETRON HCL INJECTION 0.25 MG/5ML
0.2500 mg | Freq: Once | INTRAVENOUS | Status: AC
  Administered 2023-06-12: 0.25 mg via INTRAVENOUS
  Filled 2023-06-12: qty 5

## 2023-06-12 MED ORDER — SODIUM CHLORIDE 0.9 % IV SOLN
10.0000 mg | Freq: Once | INTRAVENOUS | Status: AC
  Administered 2023-06-12: 10 mg via INTRAVENOUS
  Filled 2023-06-12: qty 10

## 2023-06-12 MED ORDER — SODIUM CHLORIDE 0.9 % IV SOLN: Freq: Once | INTRAVENOUS | Status: AC

## 2023-06-12 MED ORDER — TAMSULOSIN HCL 0.4 MG PO CAPS: 0.8000 mg | ORAL_CAPSULE | Freq: Every day | ORAL | 0 refills | Status: AC

## 2023-06-12 MED ORDER — SODIUM CHLORIDE 0.9 % IV SOLN
40.0000 mg/m2 | Freq: Once | INTRAVENOUS | Status: AC
  Administered 2023-06-12: 86 mg via INTRAVENOUS
  Filled 2023-06-12: qty 86

## 2023-06-12 MED ORDER — MAGNESIUM SULFATE 2 GM/50ML IV SOLN
2.0000 g | Freq: Once | INTRAVENOUS | Status: AC
  Administered 2023-06-12: 2 g via INTRAVENOUS
  Filled 2023-06-12: qty 50

## 2023-06-12 MED ORDER — SODIUM CHLORIDE 0.9% FLUSH
10.0000 mL | INTRAVENOUS | Status: DC | PRN
  Administered 2023-06-12: 10 mL

## 2023-06-12 NOTE — Progress Notes (Signed)
Nutrition Follow-up:  Patient with SCC of right tonsil, p16 positive s/p TORS resection under the care of Dr. Hezzie Bump 04/06/23. He is receiving concurrent chemoradiation with weekly cisplatin (first 5/28)   S/p PEG 6/3  Noted patient seen in [facility] for possible PEG tube infection, painful urination - 6/17  Met with patient in infusion. He reports getting very little sleep last night. Patient reports up and down with urinary frequency. Patient eating small amounts a few times daily. This is becoming more challenging due to lost of taste and worsening sore throat and dysphagia secondary to pain with swallowing. He ate small bowl of grits for breakfast. Had sips of chicken noodle soup for lunch. Water taste horrible. Wife of patient reports 6-8 episodes of diarrhea last week. This has improved. Wife reports episodes started prior to supplementing with tube feedings. Patient is tolerating one carton three times daily. He is flushing tube with 60 ml water before and after. RD looked at tube. Small amount of discharge around tube. Skin around insertion site red and irritated. Wife states this is much improved s/p removal of sutured buttons on 6/17 by navigator.    Medications: keflex, bactrim  Labs: reviewed   Anthropometrics: Wt 185 lb 9.6 oz today decreased   6/11 - 188 lb 9.6 oz 6/4 - 194 lb 4.8 oz  5/28 - 196 lb 3.2 oz    Estimated Energy Needs   Kcals: 2300-2600 Protein: 107-130 Fluid: >/= 2.3 L   NUTRITION DIAGNOSIS: Predicted suboptimal intake continues - addressing with TF  INTERVENTION:  Encouraged intake of soft moist textures as tolerated Reviewed bolus education. Handout with instructions + written schedule given Provided samples of Banatrol + Banatrol TF for pt to try if diarrhea returns Encouraged baking soda salt water rinses several times daily and before meals Continue prophylactic antibiotics as prescribed  One case Osmolite 1.5 + syringes provided Will contact [employer] to  set up delivery of formula/supplies  Anticipate long term enteral needs  Osmolite 1.5 - Give 7 cartons (1659 ml/day) split over 4 four feedings daily. Flush tube with 60 ml water before and after each bolus. Drink by mouth or give via tube additional 3 cups water daily. Provides 2485 kcal, 104.3 g protein, 1267 ml free water from formula (2458 ml total water with flushes). Regimen meets 100% RDI, 108% estimated calorie, 97% estimated protein needs.   MONITORING, EVALUATION, GOAL: weight trends, intake, TF   NEXT VISIT: Monday June 24 during infusion with Drema Halon

## 2023-06-12 NOTE — Telephone Encounter (Signed)
RN called and left message for pt concerning his u/a. Dr. Basilio Cairo wanted to let him know his urine looked good with no concerns. Pt was also advised to speak to his md that works with his [facility] as well for continued frequency concerns. Rn left detailed call back information if pt had questions.

## 2023-06-12 NOTE — Assessment & Plan Note (Signed)
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  He is here today for evaluation prior to receiving Cisplatin.  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  His labs today are stable. Urinary symptoms: He was started on Keflex for UTI however his urinalysis is negative and his urine culture is negative.  I reviewed this with Dr. Al Pimple who recommended discontinuing the Keflex in light of negative urine studies.  His Flomax was increased to 0.8 mg.  I reached out to his urologist Dr. Sherron Monday and had a conversation with him about his symptoms and he let me know that he will reach out to Ethan [name] and get him in within the next 48 hours to help manage the symptoms. G-tube site-He continues on Bactrim DS.  His site appears stable. Odynophagia and weight loss: He will continue to follow-up with nutrition and use tube feeds as recommended in addition to his oral intake which he is working on.  Ethan [name] will return daily for his radiation and we will continue to see him weekly prior to his Cisplatin.

## 2023-06-12 NOTE — Progress Notes (Signed)
Okay to run hydration fluids with cisplatin per Lillard Anes NP.

## 2023-06-12 NOTE — Progress Notes (Signed)
[facility] Cancer Follow up:    Practice, [facility] [address] [place] [address]   DIAGNOSIS:  Cancer Staging  Cancer of tonsillar fossa [facility]) Staging form: Pharynx - HPV-Mediated Oropharynx, AJCC 8th Edition - Pathologic stage from 04/27/2023: Stage II (pT2, pN2, cM0, p16+) - Signed by Lonie Peak, MD on 04/27/2023 Stage prefix: Initial diagnosis   SUMMARY OF ONCOLOGIC HISTORY: Oncology History  Cancer of tonsillar fossa (HCC)  02/07/2023 Initial Biopsy   Biopsy of the right tonsil  revealed: squamous cell carcinoma, p16 positive.    03/12/2023 PET scan   Right tonsillar mass as tracer avid and concerning for malignancy; hypermetabolic right level 2 and level 3 cervical lymph nodes compatible with metastatic adenopathy; indeterminate tracer avid right upper lobe elongated area of increased soft tissue thickening within the medial right upper lobe (SUV max of 5.08); and nonspecific mild tracer avid left hilar lymph nodes.    03/20/2023 Miscellaneous   Dentistry evaluation: Extractions of #18 and #19 with Dr. Azucena Cecil   04/06/2023 Surgery   TORS resection (Dr. Hezzie Bump): 2.7 cm tumor, histology of nonkeratinizing squamous cell carcinoma with focal maturation, HPV associated; + LVI and PNI; all margins negative.  8/12 + LN including right level 2A, 3, and 4 being +.  Extranodal extension present, largest tumor deposit 2.6cm.     04/27/2023 Cancer Staging   Staging form: Pharynx - HPV-Mediated Oropharynx, AJCC 8th Edition - Pathologic stage from 04/27/2023: Stage II (pT2, pN2, cM0, p16+) - Signed by Lonie Peak, MD on 04/27/2023 Stage prefix: Initial diagnosis   05/22/2023 -  Chemotherapy   Patient is on Treatment Plan : HEAD/NECK Cisplatin (40) q7d     05/23/2023 - 07/04/2023 Radiation Therapy   Concurrent chemoradiation     CURRENT THERAPY: Weekly Cisplatin with concurrent radiation  INTERVAL HISTORY: Ethan [name] 72 y.o. male returns for  follow-up and evaluation of his head neck cancer.  His main issue has been urinary frequency, hesitancy, and dysuria.  Urinalysis was negative and urine culture shows no bacteria.  He continues to experience dysgeusia and odynophagia.  He eats 3 times a day but small amounts and will supplement with tube feeds 3 times a day with Osmolite 1.5.  He also is taking in 60 ounces of water through his tube daily  He had noted a drainage around his G-tube and was seen by symptom management and prescribed antibiotics.  He was also prescribed antibiotics for UTI.     Patient Active Problem List   Diagnosis Date Noted   Port-A-Cath in place 05/29/2023   Cancer of tonsillar fossa (HCC) 03/07/2023   Arthralgia 08/29/2018   Weight loss 08/29/2018   Ureteral calculus, right 03/11/2015   Ureteral calculus 03/11/2015   Aftercare following surgery of the circulatory system, NEC 01/12/2014   Abdominal aneurysm without mention of rupture 07/08/2012    has No Known Allergies.  MEDICAL HISTORY: Past Medical History:  Diagnosis Date   AAA (abdominal aortic aneurysm) (HCC)    Hyperlipidemia    Kidney stones    Renal disorder     SURGICAL HISTORY: Past Surgical History:  Procedure Laterality Date   ABDOMINAL AORTIC ANEURYSM REPAIR  06/15/2011   EVAR   CYSTOSCOPY WITH RETROGRADE PYELOGRAM, URETEROSCOPY AND STENT PLACEMENT Right 03/11/2015   Procedure: CYSTOSCOPY WITH RETROGRADE PYELOGRAM, AND RIGHT URETERAL STENT PLACEMENT;  Surgeon: Barron Alvine, MD;  Location: [facility];  Service: Urology;  Laterality: Right;   HERNIA REPAIR  IR GASTROSTOMY TUBE MOD SED  05/28/2023   IR IMAGING GUIDED PORT INSERTION  05/28/2023   LOWER BACK FUSION, L2-L3,AND L3-L4     RIGHT LOWER TIB-FEM FRACTURE     SHOULDER SURGERY Bilateral    SPINE SURGERY     THROAT SURGERY  04/06/2023    SOCIAL HISTORY: Social History   Socioeconomic History   Marital status: Married    Spouse name: Not on file   Number of children: Not  on file   Years of education: Not on file   Highest education level: Not on file  Occupational History   Not on file  Tobacco Use   Smoking status: Some Days    Packs/day: .5    Types: Cigarettes   Smokeless tobacco: Never  Vaping Use   Vaping Use: Never used  Substance and Sexual Activity   Alcohol use: Yes    Alcohol/week: 0.0 standard drinks of alcohol    Comment: occ   Drug use: No   Sexual activity: Not Currently  Other Topics Concern   Not on file  Social History Narrative   Not on file   Social Determinants of Health   Financial Resource Strain: Not on file  Food Insecurity: No Food Insecurity (04/26/2023)   Hunger Vital Sign    Worried About Running Out of Food in the Last Year: Never true    Ran Out of Food in the Last Year: Never true  Transportation Needs: No Transportation Needs (04/26/2023)   PRAPARE - Administrator, Civil Service (Medical): No    Lack of Transportation (Non-Medical): No  Physical Activity: Not on file  Stress: Not on file  Social Connections: Not on file  Intimate Partner Violence: Not At Risk (04/26/2023)   Humiliation, Afraid, Rape, and Kick questionnaire    Fear of Current or Ex-Partner: No    Emotionally Abused: No    Physically Abused: No    Sexually Abused: No    FAMILY HISTORY: Family History  Problem Relation Age of Onset   Aneurysm Father    Stroke Mother    Heart disease Mother        Aneurysm   Hypertension Sister    Alcohol abuse Brother    Cancer Brother        mouth cancer   Aneurysm Paternal Uncle     Review of Systems  Constitutional:  Positive for appetite change and fatigue. Negative for chills, fever and unexpected weight change.  HENT:   Positive for sore throat and trouble swallowing. Negative for hearing loss, lump/mass, mouth sores and nosebleeds.   Eyes:  Negative for eye problems and icterus.  Respiratory:  Negative for chest tightness, cough and shortness of breath.   Cardiovascular:   Negative for chest pain, leg swelling and palpitations.  Gastrointestinal:  Negative for abdominal distention, abdominal pain, constipation, diarrhea, nausea and vomiting.  Endocrine: Negative for hot flashes.  Genitourinary:  Positive for dysuria and frequency. Negative for difficulty urinating.   Musculoskeletal:  Negative for arthralgias.  Skin:  Negative for itching and rash.  Neurological:  Negative for dizziness, extremity weakness, headaches and numbness.  Hematological:  Negative for adenopathy. Does not bruise/bleed easily.  Psychiatric/Behavioral:  Negative for depression. The patient is not nervous/anxious.       PHYSICAL EXAMINATION   Onc Performance Status - 06/12/23 0921       ECOG Perf Status   ECOG Perf Status Fully active, able to carry on all pre-disease performance without restriction  KPS SCALE   KPS % SCORE Able to carry on normal activity, minor s/s of disease             Vitals:   06/12/23 0905  BP: 112/60  Pulse: 75  Temp: (!) 97.5 F (36.4 C)  SpO2: 100%    Physical Exam Constitutional:      General: He is not in acute distress.    Appearance: Normal appearance. He is not toxic-appearing.  HENT:     Head: Normocephalic and atraumatic.     Mouth/Throat:     Mouth: Mucous membranes are moist.     Pharynx: Oropharynx is clear. No oropharyngeal exudate or posterior oropharyngeal erythema.  Eyes:     General: No scleral icterus. Cardiovascular:     Rate and Rhythm: Normal rate and regular rhythm.     Pulses: Normal pulses.     Heart sounds: Normal heart sounds.  Pulmonary:     Effort: Pulmonary effort is normal.     Breath sounds: Normal breath sounds.  Abdominal:     General: Abdomen is flat. Bowel sounds are normal. There is no distension.     Palpations: Abdomen is soft.     Tenderness: There is no abdominal tenderness.     Comments: G-tube with small amount of drainage around the insertion site that is on his dressing.  Small  amount of erythema around insertion site as well.  Musculoskeletal:        General: No swelling.     Cervical back: Neck supple.  Lymphadenopathy:     Cervical: No cervical adenopathy.  Skin:    General: Skin is warm and dry.     Findings: No rash.  Neurological:     General: No focal deficit present.     Mental Status: He is alert.  Psychiatric:        Mood and Affect: Mood normal.        Behavior: Behavior normal.     LABORATORY DATA:  CBC    Component Value Date/Time   WBC 6.0 06/11/2023 1133   WBC 9.2 05/28/2023 0813   RBC 4.52 06/11/2023 1133   HGB 15.2 06/11/2023 1133   HCT 43.9 06/11/2023 1133   PLT 153 06/11/2023 1133   MCV 97.1 06/11/2023 1133   MCH 33.6 06/11/2023 1133   MCHC 34.6 06/11/2023 1133   RDW 12.3 06/11/2023 1133   LYMPHSABS 0.5 (L) 06/11/2023 1133   MONOABS 0.5 06/11/2023 1133   EOSABS 0.0 06/11/2023 1133   BASOSABS 0.0 06/11/2023 1133    CMP     Component Value Date/Time   NA 135 06/11/2023 1133   K 4.3 06/11/2023 1133   CL 99 06/11/2023 1133   CO2 31 06/11/2023 1133   GLUCOSE 93 06/11/2023 1133   BUN 20 06/11/2023 1133   CREATININE 1.01 06/11/2023 1133   CALCIUM 9.6 06/11/2023 1133   PROT 7.2 03/06/2023 1255   ALBUMIN 4.3 03/06/2023 1255   AST 13 (L) 03/06/2023 1255   ALT 8 03/06/2023 1255   ALKPHOS 63 03/06/2023 1255   BILITOT 0.5 03/06/2023 1255   GFRNONAA >60 06/11/2023 1133   GFRAA 49 (L) 03/12/2015 0505    ASSESSMENT and THERAPY PLAN:   Cancer of tonsillar fossa (HCC) Ethan [name] is a 72 year old man with 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  He is here today for evaluation prior to receiving Cisplatin.  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  His labs today are stable. Urinary symptoms: He was started on Keflex for UTI however his urinalysis is negative and his urine culture is  negative.  I reviewed this with Dr. Al Pimple who recommended discontinuing the Keflex in light of negative urine studies.  His Flomax was increased to 0.8 mg.  I reached out to his urologist Dr. Sherron Monday and had a conversation with him about his symptoms and he let me know that he will reach out to Mr. Durel and get him in within the next 48 hours to help manage the symptoms. G-tube site-He continues on Bactrim DS.  His site appears stable. Odynophagia and weight loss: He will continue to follow-up with nutrition and use tube feeds as recommended in addition to his oral intake which he is working on.  Koty will return daily for his radiation and we will continue to see him weekly prior to his Cisplatin.   All questions were answered. The patient knows to call the clinic with any problems, questions or concerns. We can certainly see the patient much sooner if necessary.  Total encounter time:45 minutes*in face-to-face visit time, chart review, lab review, care coordination, order entry, and documentation of the encounter time.  Lillard Anes, NP 06/12/23 12:46 PM Medical Oncology and Hematology [facility] [address] [place], [place] [address] Tel. [phone]    Fax. [phone]  *Total Encounter Time as defined by the Centers for Medicare and Medicaid Services includes, in addition to the face-to-face time of a patient visit (documented in the note above) non-face-to-face time: obtaining and reviewing outside history, ordering and reviewing medications, tests or procedures, care coordination (communications with other health care professionals or caregivers) and documentation in the medical record.

## 2023-06-13 ENCOUNTER — Other Ambulatory Visit: Payer: Self-pay

## 2023-06-13 ENCOUNTER — Ambulatory Visit
Admission: RE | Admit: 2023-06-13 | Discharge: 2023-06-13 | Disposition: A | Discharge status: Home / Self Care | Payer: Medicare Other | Source: Ambulatory Visit | Attending: Radiation Oncology | Admitting: Radiation Oncology

## 2023-06-13 DIAGNOSIS — R350 Frequency of micturition: Secondary | ICD-10-CM | POA: Diagnosis not present

## 2023-06-13 LAB — RAD ONC ARIA SESSION SUMMARY
Course Elapsed Days: 21
Plan Fractions Treated to Date: 15
Plan Prescribed Dose Per Fraction: 2 Gy
Plan Total Fractions Prescribed: 30
Plan Total Prescribed Dose: 60 Gy
Reference Point Dosage Given to Date: 30 Gy
Reference Point Session Dosage Given: 2 Gy
Session Number: 15

## 2023-06-14 ENCOUNTER — Other Ambulatory Visit: Payer: Self-pay

## 2023-06-14 ENCOUNTER — Ambulatory Visit
Admission: RE | Admit: 2023-06-14 | Discharge: 2023-06-14 | Disposition: A | Discharge status: Home / Self Care | Payer: Medicare Other | Source: Ambulatory Visit | Attending: Radiation Oncology | Admitting: Radiation Oncology

## 2023-06-14 DIAGNOSIS — R350 Frequency of micturition: Secondary | ICD-10-CM | POA: Diagnosis not present

## 2023-06-14 LAB — RAD ONC ARIA SESSION SUMMARY
Course Elapsed Days: 22
Plan Fractions Treated to Date: 16
Plan Prescribed Dose Per Fraction: 2 Gy
Plan Total Fractions Prescribed: 30
Plan Total Prescribed Dose: 60 Gy
Reference Point Dosage Given to Date: 32 Gy
Reference Point Session Dosage Given: 2 Gy
Session Number: 16

## 2023-06-15 ENCOUNTER — Other Ambulatory Visit: Payer: Self-pay

## 2023-06-15 ENCOUNTER — Ambulatory Visit
Admission: RE | Admit: 2023-06-15 | Discharge: 2023-06-15 | Disposition: A | Discharge status: Home / Self Care | Payer: Medicare Other | Source: Ambulatory Visit | Attending: Radiation Oncology | Admitting: Radiation Oncology

## 2023-06-15 DIAGNOSIS — R350 Frequency of micturition: Secondary | ICD-10-CM | POA: Diagnosis not present

## 2023-06-15 LAB — RAD ONC ARIA SESSION SUMMARY
Course Elapsed Days: 23
Plan Fractions Treated to Date: 17
Plan Prescribed Dose Per Fraction: 2 Gy
Plan Total Fractions Prescribed: 30
Plan Total Prescribed Dose: 60 Gy
Reference Point Dosage Given to Date: 34 Gy
Reference Point Session Dosage Given: 2 Gy
Session Number: 17

## 2023-06-15 MED FILL — Dexamethasone Sodium Phosphate Inj 100 MG/10ML: INTRAMUSCULAR | Qty: 1 | Status: AC

## 2023-06-15 MED FILL — Fosaprepitant Dimeglumine For IV Infusion 150 MG (Base Eq): INTRAVENOUS | Qty: 5 | Status: AC

## 2023-06-16 ENCOUNTER — Other Ambulatory Visit: Payer: Self-pay

## 2023-06-16 ENCOUNTER — Emergency Department (HOSPITAL_COMMUNITY): Payer: Medicare Other

## 2023-06-16 ENCOUNTER — Emergency Department (HOSPITAL_COMMUNITY)
Admission: EM | Admit: 2023-06-16 | Discharge: 2023-06-16 | Disposition: A | Discharge status: Home / Self Care | Payer: Medicare Other | Attending: Emergency Medicine | Admitting: Emergency Medicine

## 2023-06-16 DIAGNOSIS — R55 Syncope and collapse: Secondary | ICD-10-CM | POA: Insufficient documentation

## 2023-06-16 DIAGNOSIS — C760 Malignant neoplasm of head, face and neck: Secondary | ICD-10-CM | POA: Insufficient documentation

## 2023-06-16 DIAGNOSIS — R630 Anorexia: Secondary | ICD-10-CM | POA: Diagnosis not present

## 2023-06-16 LAB — URINALYSIS, ROUTINE W REFLEX MICROSCOPIC
Bilirubin Urine: NEGATIVE
Glucose, UA: NEGATIVE mg/dL
Hgb urine dipstick: NEGATIVE
Ketones, ur: NEGATIVE mg/dL
Leukocytes,Ua: NEGATIVE
Nitrite: NEGATIVE
Protein, ur: NEGATIVE mg/dL
Specific Gravity, Urine: 1.014 (ref 1.005–1.030)
pH: 6 (ref 5.0–8.0)

## 2023-06-16 LAB — CBC
HCT: 37.5 % — ABNORMAL LOW (ref 39.0–52.0)
Hemoglobin: 13.1 g/dL (ref 13.0–17.0)
MCH: 33.9 pg (ref 26.0–34.0)
MCHC: 34.9 g/dL (ref 30.0–36.0)
MCV: 97.2 fL (ref 80.0–100.0)
Platelets: 110 10*3/uL — ABNORMAL LOW (ref 150–400)
RBC: 3.86 MIL/uL — ABNORMAL LOW (ref 4.22–5.81)
RDW: 12.7 % (ref 11.5–15.5)
WBC: 4.8 10*3/uL (ref 4.0–10.5)
nRBC: 0 % (ref 0.0–0.2)

## 2023-06-16 LAB — BASIC METABOLIC PANEL
Anion gap: 8 (ref 5–15)
BUN: 32 mg/dL — ABNORMAL HIGH (ref 8–23)
CO2: 25 mmol/L (ref 22–32)
Calcium: 8.3 mg/dL — ABNORMAL LOW (ref 8.9–10.3)
Chloride: 98 mmol/L (ref 98–111)
Creatinine, Ser: 1.31 mg/dL — ABNORMAL HIGH (ref 0.61–1.24)
GFR, Estimated: 58 mL/min — ABNORMAL LOW (ref >60)
Glucose, Bld: 135 mg/dL — ABNORMAL HIGH (ref 70–99)
Potassium: 4.1 mmol/L (ref 3.5–5.1)
Sodium: 131 mmol/L — ABNORMAL LOW (ref 135–145)

## 2023-06-16 LAB — CBG MONITORING, ED: Glucose-Capillary: 152 mg/dL — ABNORMAL HIGH (ref 70–99)

## 2023-06-16 MED ORDER — LACTATED RINGERS IV BOLUS
1000.0000 mL | Freq: Once | INTRAVENOUS | Status: AC
  Administered 2023-06-16: 1000 mL via INTRAVENOUS

## 2023-06-16 NOTE — ED Provider Notes (Signed)
[facility] Provider Note   CSN: [id number] Arrival date & time: 06/16/23  1541     History Chief Complaint  Patient presents with   Hypotension   Dizziness    HPI Ethan [name] is a 72 y.o. male presenting for chief complaint of near syncope.  He is a 72 year old male with extensive medical history active ENT cancer undergoing chemotherapy and radiation last therapy on Friday.  Has had poor p.o. intake recent PEG tube placement for feedings. States that this week and he was unable to keep much down when he went to stand up after breakfast this morning he had a near syncope event.  His wife is in the medical field and performed orthostatic blood pressures.  His lying down systolic blood pressure was 90/50 his sitting up systolic blood pressure was 70/40.  She brought him in for further care and management. He denies fevers chills, nausea vomiting at this time, syncope or shortness of breath..   Patient's recorded medical, surgical, social, medication list and allergies were reviewed in the Snapshot window as part of the initial history.   Review of Systems   Review of Systems  Constitutional:  Negative for chills and fever.  HENT:  Negative for ear pain and sore throat.   Eyes:  Negative for pain and visual disturbance.  Respiratory:  Negative for cough and shortness of breath.   Cardiovascular:  Negative for chest pain and palpitations.  Gastrointestinal:  Negative for abdominal pain and vomiting.  Genitourinary:  Negative for dysuria and hematuria.  Musculoskeletal:  Negative for arthralgias and back pain.  Skin:  Negative for color change and rash.  Neurological:  Positive for light-headedness. Negative for seizures and syncope.  All other systems reviewed and are negative.   Physical Exam Updated Vital Signs BP 126/68   Pulse (!) 59   Temp 98.6 F (37 C) (Oral)   Resp 19   Ht 6\' 3"  (1.905 m)   Wt 81.6 kg   SpO2 98%    BMI 22.50 kg/m  Physical Exam Vitals and nursing note reviewed.  Constitutional:      General: He is not in acute distress.    Appearance: He is well-developed.  HENT:     Head: Normocephalic and atraumatic.  Eyes:     Conjunctiva/sclera: Conjunctivae normal.  Cardiovascular:     Rate and Rhythm: Normal rate and regular rhythm.     Heart sounds: No murmur heard. Pulmonary:     Effort: Pulmonary effort is normal. No respiratory distress.     Breath sounds: Normal breath sounds.  Abdominal:     Palpations: Abdomen is soft.     Tenderness: There is no abdominal tenderness.  Musculoskeletal:        General: No swelling.     Cervical back: Neck supple.  Skin:    General: Skin is warm and dry.     Capillary Refill: Capillary refill takes less than 2 seconds.  Neurological:     Mental Status: He is alert.  Psychiatric:        Mood and Affect: Mood normal.      ED Course/ Medical Decision Making/ A&P Clinical Course as of 06/16/23 2023  Sat Jun 16, 2023  1702 BUN(!): 32 AKI, likely prerenal [CC]    Clinical Course User Index [CC] Ethan Ade, MD    Procedures Procedures   Medications Ordered in ED Medications  lactated ringers bolus 1,000 mL (0 mLs Intravenous Stopped  06/16/23 1847)  lactated ringers bolus 1,000 mL (1,000 mLs Intravenous New Bag/Given 06/16/23 1848)   Medical Decision Making:   [name] is a 72 y.o. male who presented to the ED today with a near-syncopal episode detailed above.    Additional history discussed with patient's family/caregivers.  Patient placed on continuous vitals and telemetry monitoring while in ED which was reviewed periodically.  Complete initial physical exam performed, notably the patient  was hemodynamically stable in no acute distress.    Reviewed and confirmed nursing documentation for past medical history, family history, social history.    Initial Assessment:   With the patient's presentation of syncope,  most likely diagnosis is orthostatic hypotension vs vasovagal episode. Other diagnoses were considered including (but not limited to) arrythmogenic syncope, valvular abnormality, PE, aortic dissection. These are considered less likely due to history of present illness and physical exam findings.   This is most consistent with an acute life/limb threatening illness complicated by underlying chronic conditions. In particular, concerning cardiac etiology, this is less likely to be the etiology given the lack of chest pain, lack of serious comorbidities including heart failure or CAD.   Additionally, patient's history appears more consistent with benign episodes including orthostatic event vs  vagal episode based on palpitations prior to the event, positive orthostatic testing at home. Initial Plan:  Screening labs including CBC and Metabolic panel to evaluate for infectious or metabolic etiology of disease.  Urinalysis with reflex culture ordered to evaluate for UTI or relevant urologic/nephrologic pathology.  CXR to evaluate for structural/infectious intrathoracic pathology.  EKG to evaluate for cardiac pathology. Utilization of FAINT scoring detailed above.  Objective evaluation as below reviewed after administration of IVF/Telemetry monitoring  Initial Study Results:   Laboratory  All laboratory results reviewed without evidence of clinically relevant pathology.   EKG EKG was reviewed independently. Rate, rhythm, axis, intervals all examined and without medically relevant abnormality. ST segments without concerns for elevations.    Radiology:  All images reviewed independently. Agree with radiology report at this time.      Final Assessment and Plan:   On reassessment, blood pressure has improved from 90/50 to 120/60.  Given resolution of symptoms, findings on lab work, patient is stable for outpatient care management after IV fluid resuscitation.  He feels comfortable with ongoing outpatient  care management at this time.  Disposition:  I have considered need for hospitalization, however, considering all of the above, I believe this patient is stable for discharge at this time.  Patient/family educated about specific return precautions for given chief complaint and symptoms.  Patient/family educated about follow-up with PCP.    Patient/family expressed understanding of return precautions and need for follow-up. Patient spoken to regarding all imaging and laboratory results and appropriate follow up for these results. All education provided in verbal form with additional information in written form. Time was allowed for answering of patient questions. Patient discharged.    Emergency Department Medication Summary:   Medications  lactated ringers bolus 1,000 mL (0 mLs Intravenous Stopped 06/16/23 1847)  lactated ringers bolus 1,000 mL (1,000 mLs Intravenous New Bag/Given 06/16/23 1848)        Clinical Impression:  1. Syncope, unspecified syncope type      Discharge   Final Clinical Impression(s) / ED Diagnoses Final diagnoses:  Syncope, unspecified syncope type    Rx / DC Orders ED Discharge Orders     None         Ethan Ade, MD  06/16/23 2023  

## 2023-06-16 NOTE — ED Triage Notes (Signed)
Pt arrived via POV. C/o hypotension and dizziness when they stand beginning today. Pt is receiving chemo and radiation.  Pt [id number]

## 2023-06-18 ENCOUNTER — Ambulatory Visit
Admission: RE | Admit: 2023-06-18 | Discharge: 2023-06-18 | Disposition: A | Discharge status: Home / Self Care | Payer: Medicare Other | Source: Ambulatory Visit | Attending: Radiation Oncology | Admitting: Radiation Oncology

## 2023-06-18 ENCOUNTER — Telehealth: Payer: Self-pay | Admitting: Radiation Oncology

## 2023-06-18 ENCOUNTER — Other Ambulatory Visit: Payer: Self-pay

## 2023-06-18 ENCOUNTER — Inpatient Hospital Stay: Payer: Medicare Other

## 2023-06-18 ENCOUNTER — Inpatient Hospital Stay: Payer: Medicare Other | Admitting: Hematology and Oncology

## 2023-06-18 ENCOUNTER — Telehealth: Payer: Self-pay

## 2023-06-18 VITALS — BP 128/69 | HR 66 | Resp 16

## 2023-06-18 DIAGNOSIS — C090 Malignant neoplasm of tonsillar fossa: Secondary | ICD-10-CM

## 2023-06-18 DIAGNOSIS — Z95828 Presence of other vascular implants and grafts: Secondary | ICD-10-CM

## 2023-06-18 DIAGNOSIS — R350 Frequency of micturition: Secondary | ICD-10-CM | POA: Diagnosis not present

## 2023-06-18 LAB — RAD ONC ARIA SESSION SUMMARY
Course Elapsed Days: 26
Plan Fractions Treated to Date: 18
Plan Prescribed Dose Per Fraction: 2 Gy
Plan Total Fractions Prescribed: 30
Plan Total Prescribed Dose: 60 Gy
Reference Point Dosage Given to Date: 36 Gy
Reference Point Session Dosage Given: 2 Gy
Session Number: 18

## 2023-06-18 LAB — BASIC METABOLIC PANEL - CANCER CENTER ONLY
Anion gap: 6 (ref 5–15)
BUN: 26 mg/dL — ABNORMAL HIGH (ref 8–23)
CO2: 27 mmol/L (ref 22–32)
Calcium: 9.2 mg/dL (ref 8.9–10.3)
Chloride: 101 mmol/L (ref 98–111)
Creatinine: 1.34 mg/dL — ABNORMAL HIGH (ref 0.61–1.24)
GFR, Estimated: 56 mL/min — ABNORMAL LOW (ref >60)
Glucose, Bld: 96 mg/dL (ref 70–99)
Potassium: 4.3 mmol/L (ref 3.5–5.1)
Sodium: 134 mmol/L — ABNORMAL LOW (ref 135–145)

## 2023-06-18 LAB — CBC WITH DIFFERENTIAL (CANCER CENTER ONLY)
Abs Immature Granulocytes: 0.02 10*3/uL (ref 0.00–0.07)
Basophils Absolute: 0 10*3/uL (ref 0.0–0.1)
Basophils Relative: 1 %
Eosinophils Absolute: 0 10*3/uL (ref 0.0–0.5)
Eosinophils Relative: 1 %
HCT: 39.1 % (ref 39.0–52.0)
Hemoglobin: 13.7 g/dL (ref 13.0–17.0)
Immature Granulocytes: 1 %
Lymphocytes Relative: 10 %
Lymphs Abs: 0.5 10*3/uL — ABNORMAL LOW (ref 0.7–4.0)
MCH: 33.6 pg (ref 26.0–34.0)
MCHC: 35 g/dL (ref 30.0–36.0)
MCV: 95.8 fL (ref 80.0–100.0)
Monocytes Absolute: 0.3 10*3/uL (ref 0.1–1.0)
Monocytes Relative: 7 %
Neutro Abs: 3.6 10*3/uL (ref 1.7–7.7)
Neutrophils Relative %: 80 %
Platelet Count: 113 10*3/uL — ABNORMAL LOW (ref 150–400)
RBC: 4.08 MIL/uL — ABNORMAL LOW (ref 4.22–5.81)
RDW: 12.5 % (ref 11.5–15.5)
WBC Count: 4.4 10*3/uL (ref 4.0–10.5)
nRBC: 0 % (ref 0.0–0.2)

## 2023-06-18 LAB — MAGNESIUM: Magnesium: 1.9 mg/dL (ref 1.7–2.4)

## 2023-06-18 MED ORDER — SODIUM CHLORIDE 0.9 % IV SOLN
30.0000 mg/m2 | Freq: Once | INTRAVENOUS | Status: AC
  Administered 2023-06-18: 65 mg via INTRAVENOUS
  Filled 2023-06-18: qty 65

## 2023-06-18 MED ORDER — POTASSIUM CHLORIDE IN NACL 20-0.9 MEQ/L-% IV SOLN
Freq: Once | INTRAVENOUS | Status: AC
  Filled 2023-06-18: qty 1000

## 2023-06-18 MED ORDER — PALONOSETRON HCL INJECTION 0.25 MG/5ML
0.2500 mg | Freq: Once | INTRAVENOUS | Status: AC
  Administered 2023-06-18: 0.25 mg via INTRAVENOUS
  Filled 2023-06-18: qty 5

## 2023-06-18 MED ORDER — MAGNESIUM SULFATE 2 GM/50ML IV SOLN
2.0000 g | Freq: Once | INTRAVENOUS | Status: AC
  Administered 2023-06-18: 2 g via INTRAVENOUS
  Filled 2023-06-18: qty 50

## 2023-06-18 MED ORDER — SODIUM CHLORIDE 0.9% FLUSH
10.0000 mL | Freq: Once | INTRAVENOUS | Status: AC
  Administered 2023-06-18: 10 mL

## 2023-06-18 MED ORDER — SODIUM CHLORIDE 0.9% FLUSH
10.0000 mL | INTRAVENOUS | Status: DC | PRN
  Administered 2023-06-18: 10 mL

## 2023-06-18 MED ORDER — HEPARIN SOD (PORK) LOCK FLUSH 100 UNIT/ML IV SOLN
500.0000 [IU] | Freq: Once | INTRAVENOUS | Status: AC | PRN
  Administered 2023-06-18: 500 [IU]

## 2023-06-18 MED ORDER — SODIUM CHLORIDE 0.9 % IV SOLN
150.0000 mg | Freq: Once | INTRAVENOUS | Status: AC
  Administered 2023-06-18: 150 mg via INTRAVENOUS
  Filled 2023-06-18: qty 150

## 2023-06-18 MED ORDER — SODIUM CHLORIDE 0.9 % IV SOLN
10.0000 mg | Freq: Once | INTRAVENOUS | Status: AC
  Administered 2023-06-18: 10 mg via INTRAVENOUS
  Filled 2023-06-18: qty 10

## 2023-06-18 MED ORDER — SODIUM CHLORIDE 0.9 % IV SOLN: Freq: Once | INTRAVENOUS | Status: AC

## 2023-06-18 NOTE — Patient Instructions (Signed)
[facility]  Discharge Instructions: Thank you for choosing [facility] to provide your oncology and hematology care.   If you have a lab appointment with the Cancer Center, please go directly to the Cancer Center and check in at the registration area.   Wear comfortable clothing and clothing appropriate for easy access to any Portacath or PICC line.   We strive to give you quality time with your provider. You may need to reschedule your appointment if you arrive late (15 or more minutes).  Arriving late affects you and other patients whose appointments are after yours.  Also, if you miss three or more appointments without notifying the office, you may be dismissed from the clinic at the provider's discretion.      For prescription refill requests, have your pharmacy contact our office and allow 72 hours for refills to be completed.    Today you received the following chemotherapy and/or immunotherapy agent: Cisplatin   To help prevent nausea and vomiting after your treatment, we encourage you to take your nausea medication as directed.  BELOW ARE SYMPTOMS THAT SHOULD BE REPORTED IMMEDIATELY: *FEVER GREATER THAN 100.4 F (38 C) OR HIGHER *CHILLS OR SWEATING *NAUSEA AND VOMITING THAT IS NOT CONTROLLED WITH YOUR NAUSEA MEDICATION *UNUSUAL SHORTNESS OF BREATH *UNUSUAL BRUISING OR BLEEDING *URINARY PROBLEMS (pain or burning when urinating, or frequent urination) *BOWEL PROBLEMS (unusual diarrhea, constipation, pain near the anus) TENDERNESS IN MOUTH AND THROAT WITH OR WITHOUT PRESENCE OF ULCERS (sore throat, sores in mouth, or a toothache) UNUSUAL RASH, SWELLING OR PAIN  UNUSUAL VAGINAL DISCHARGE OR ITCHING   Items with * indicate a potential emergency and should be followed up as soon as possible or go to the Emergency Department if any problems should occur.  Please show the CHEMOTHERAPY ALERT CARD or IMMUNOTHERAPY ALERT CARD at check-in  to the Emergency Department and triage nurse.  Should you have questions after your visit or need to cancel or reschedule your appointment, please contact [facility]  Dept: [phone]  and follow the prompts.  Office hours are 8:00 a.m. to 4:30 p.m. Monday - Friday. Please note that voicemails left after 4:00 p.m. may not be returned until the following business day.  We are closed weekends and major holidays. You have access to a nurse at all times for urgent questions. Please call the main number to the clinic Dept: [phone] and follow the prompts.   For any non-urgent questions, you may also contact your provider using MyChart. We now offer e-Visits for anyone 18 and older to request care online for non-urgent symptoms. For details visit [email or web address].   Also download the MyChart app! Go to the app store, search "MyChart", open the app, select [facility], and log in with your MyChart username and password.   

## 2023-06-18 NOTE — Progress Notes (Signed)
Ok to run hydration fluids with cisplatin, per Dr. Al Pimple.

## 2023-06-18 NOTE — Telephone Encounter (Signed)
6/24 @ 8:40 am received documentation from call center concerning this patient had to go to ER due to having orthostatic blood pressure problems.  Sent email to Dr. Basilio Cairo, Zerita Boers, RN and L2 machine so they are aware.

## 2023-06-18 NOTE — Telephone Encounter (Signed)
Rn called pt to check on him since he was in the ED this weekend. He stated he is feeling better and is here for his treatments today. Pt already checked in for his port and lab when RN called.

## 2023-06-18 NOTE — Progress Notes (Signed)
Nutrition Follow-up:  Patient with SCC of right tonsil, p 16 positive s/p TORS resection.  Patient receiving concurrent chemotherapy and radiation.    Met with patient and wife during infusion.  Wife completed interview.  Patient with eyes closed during visit.  Wife says that patient was able to take 3 cartons of osmolite 1.5 via tube yesterday.  Noted in the ED for syncope/orthostatic hypotension on Saturday.  Says that she is giving 30ml of water before formula and 50ml after.  Mixing about 6 oz of water ( ) with formula because unable to go through tube.  Reports constipation and taking miralax daily for past 2 days.  Has been eating 2 eggs and grits with coffee for breakfast.  Tries to eat something at lunch and supper meals. Wife says that patient has been drinking fluids orally. Reports that patient is feeling queasy at times to not want to do tube feeding.  Not taking nausea medication.   Wife reports that they are working up to the 7 cartons per day.  Did receive a shipment of formula and supplies from [employer]    Medications: compazine, zofran  Labs: Na 134, BUN 26, creatinine 1.34  Anthropometrics:   Weight 180 lb  185 lb 9.6 oz on 6/18 188 lb 9.6 oz on 6/11 194 lb 4.8 oz on 6/4 196 lb 3.2 oz on 5/28   Estimated Energy Needs  Kcals: 2300-2600 Protein: 107-130 g Fluid: >/= 2.3 L  NUTRITION DIAGNOSIS: Predicted sub optimal intake continues - addressing with tube feeding   INTERVENTION:  Continue miralax daily and if no BM by tomorrow to contact MD regarding either increasing dose or adding new medication to bowel regimen. Recommend taking nausea medication at first sign of nausea so that patient is able to complete tube feeding and/or eat orally Continue titration of tube feeding especially with weight loss.     MONITORING, EVALUATION, GOAL: weight trends, tube feeding   NEXT VISIT: Tuesday, July 2 during infusion  Jayln Madeira B. Freida Busman, RD, LDN Registered  Dietitian [phone]

## 2023-06-18 NOTE — Progress Notes (Signed)
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 We have discussed about adjuvant chemoradiation when we last met him. Recently we had a conversation that the RUL nodule is persistent and cavitary and hence is of concern that it could be malignant but Dr Basilio Cairo feels it could be primary lung and slow growing overall, so we should proceed with Chemoradiation and follow up on lung nodule especially since there is no significant growth on the most recent scans. He is now here before fifth planned weekly cycle of cisplatin. He is doing well overall except for a recent ED visit to near syncope.  Other than the near-syncope, he has been able to maintain his weight.  No change in hearing or neuropathy reported.  His GFR has dropped a little hence we will dose adjust the cisplatin down by 25%.  With regards to the near syncope and possible dehydration, we will arrange for fluids once a week other than the fluids he will receive during each  treatment.  He prefers this to be on Fridays.  He will return to clinic next week for toxicity check again.  HISTORY OF PRESENTING ILLNESS:  Ethan [name] 72 y.o. male is here because of SCC tongue, P16 positive.  This is a very pleasant 72 year old male patient with past medical history significant for abdominal aortic aneurysm status postrepair now diagnosed with squamous cell carcinoma of the base of the tongue at diagnosis had T1 N1 M0 status post definitive surgery had radical tonsillectomy right neck lymph node dissection with final pathology showing tumor size of 2.7 cm nonkeratinizing squamous cell carcinoma with focal maturation, HPV associated, lymphovascular invasion and perineural invasion identified, negative surgical margins, 8 out of 12 lymph nodes with carcinoma and the largest tumor deposit was 2.6 cm in greatest dimension extranodal extension present referred to medical oncology for adjuvant chemotherapy.  At baseline he is extremely healthy, very active, eats healthy and denies any major medical issues.  Initially when he saw Dr. Basilio Cairo before surgery, he was discussed the option for concurrent chemoradiation versus surgery but he chose to proceed with surgery. When I last saw him, we discussed about concurrent CRT. He also has a slow growing RUL which is likely a second primary lung malignancy. Group discussion was to continue with planned chemo RT and evaluate this RUL nodule after completing adjuvant chemotherapy.  He is here after his fourth week of chemotherapy. Since his last visit, he had a near syncopal episode and went to ED. He required some fluids and he went home. He otherwise also remains constipated and wondering if he can take Miralax. He is maintaining his weight. He denies any  tingling or numbness. No neuropathy reported. Rest of the pertinent 10 point ROS reviewed and neg.  Wt Readings from Last 3 Encounters:  06/18/23 180 lb (81.6 kg)  06/16/23 180 lb (81.6 kg)   06/12/23 185 lb 9.6 oz (84.2 kg)     MEDICAL HISTORY:  Past Medical History:  Diagnosis Date   AAA (abdominal aortic aneurysm) (HCC)    Hyperlipidemia    Kidney stones    Renal disorder     SURGICAL HISTORY: Past Surgical History:  Procedure Laterality Date   ABDOMINAL AORTIC ANEURYSM REPAIR  06/15/2011   EVAR   CYSTOSCOPY WITH RETROGRADE PYELOGRAM, URETEROSCOPY AND STENT PLACEMENT Right 03/11/2015   Procedure: CYSTOSCOPY WITH RETROGRADE PYELOGRAM, AND RIGHT URETERAL STENT PLACEMENT;  Surgeon: Barron Alvine, MD;  Location: [facility];  Service: Urology;  Laterality: Right;   HERNIA REPAIR     IR GASTROSTOMY TUBE MOD SED  05/28/2023   IR IMAGING GUIDED PORT INSERTION  05/28/2023   LOWER BACK FUSION, L2-L3,AND L3-L4     RIGHT LOWER TIB-FEM FRACTURE     SHOULDER SURGERY Bilateral    SPINE SURGERY     THROAT SURGERY  04/06/2023    SOCIAL HISTORY: Social History   Socioeconomic History   Marital status: Married    Spouse name: Not on file   Number of children: Not on file   Years of education: Not on file   Highest education level: Not on file  Occupational History   Not on file  Tobacco Use   Smoking status: Some Days    Packs/day: .5    Types: Cigarettes   Smokeless tobacco: Never  Vaping Use   Vaping Use: Never used  Substance and Sexual Activity   Alcohol use: Yes    Alcohol/week: 0.0 standard drinks of alcohol    Comment: occ   Drug use: No   Sexual activity: Not Currently  Other Topics Concern   Not on file  Social History Narrative   Not on file   Social Determinants of Health   Financial Resource Strain: Not on file  Food Insecurity: No Food Insecurity (04/26/2023)   Hunger Vital Sign    Worried About Running Out of Food in the Last Year: Never true    Ran Out of Food in the Last Year: Never true  Transportation Needs: No Transportation Needs (04/26/2023)   PRAPARE - Administrator, Civil Service (Medical): No    Lack of Transportation  (Non-Medical): No  Physical Activity: Not on file  Stress: Not on file  Social Connections: Not on file  Intimate Partner Violence: Not At Risk (04/26/2023)   Humiliation, Afraid, Rape, and Kick questionnaire    Fear of Current or Ex-Partner: No    Emotionally Abused: No    Physically Abused: No    Sexually Abused: No    FAMILY HISTORY: Family History  Problem Relation Age of Onset   Aneurysm Father    Stroke Mother    Heart disease Mother        Aneurysm   Hypertension Sister    Alcohol abuse Brother    Cancer Brother        mouth cancer   Aneurysm Paternal Uncle     ALLERGIES:  has No Known Allergies.  MEDICATIONS:  Current Outpatient Medications  Medication Sig Dispense Refill   augmented betamethasone dipropionate (DIPROLENE-AF) 0.05 % cream APPLY TO RASH ON THE SKIN TWICE DAILY FOR 2 3 WEEKS.     dexamethasone (DECADRON) 4 MG  tablet Take 2 tablets (8 mg) by mouth daily x 3 days starting the day after cisplatin chemotherapy. Take with food. 30 tablet 1   lidocaine (XYLOCAINE) 2 % solution Use as directed 15 mLs in the mouth or throat as needed for mouth pain. 100 mL 0   lidocaine-prilocaine (EMLA) cream Apply to affected area once 30 g 3   Nutritional Supplements (FEEDING SUPPLEMENT, OSMOLITE 1.5 CAL,) LIQD Osmolite 1.5 - Give 7 cartons (1659 ml/day) split over 4 four feedings daily. Flush tube with 60 ml water before and after each bolus. Drink by mouth or give via tube additional 3 cups water daily. Provides 2485 kcal, 104.3 g protein, 1267 ml free water from formula (2458 ml total water with flushes). Regimen meets 100% RDI, 108% estimated calorie, 97% estimated protein needs. 1659 mL 0   ondansetron (ZOFRAN) 8 MG tablet Take 1 tablet (8 mg total) by mouth every 8 (eight) hours as needed for nausea or vomiting. Start on the third day after cisplatin. (Patient not taking: Reported on 06/12/2023) 30 tablet 1   oxyCODONE (OXY IR/ROXICODONE) 5 MG immediate release tablet Take 1  tablet (5 mg total) by mouth every 12 (twelve) hours as needed for severe pain. 20 tablet 0   prochlorperazine (COMPAZINE) 10 MG tablet Take 1 tablet (10 mg total) by mouth every 6 (six) hours as needed (Nausea or vomiting). 30 tablet 1   sulfamethoxazole-trimethoprim (BACTRIM DS) 800-160 MG tablet Take 1 tablet by mouth 2 (two) times daily for 7 days. 14 tablet 0   tamsulosin (FLOMAX) 0.4 MG CAPS capsule Take 2 capsules (0.8 mg total) by mouth daily after breakfast. 60 capsule 0   No current facility-administered medications for this visit.     PHYSICAL EXAMINATION: ECOG PERFORMANCE STATUS: 0 - Asymptomatic  There were no vitals filed for this visit.   There were no vitals filed for this visit.   Physical Exam Constitutional:      Appearance: Normal appearance.  Cardiovascular:     Rate and Rhythm: Normal rate and regular rhythm.     Pulses: Normal pulses.     Heart sounds: Normal heart sounds.  Pulmonary:     Effort: Pulmonary effort is normal.     Breath sounds: Normal breath sounds.  Musculoskeletal:        General: Normal range of motion.     Cervical back: Normal range of motion. No rigidity.  Lymphadenopathy:     Cervical: No cervical adenopathy.  Skin:    General: Skin is warm and dry.  Neurological:     General: No focal deficit present.     Mental Status: He is alert.      LABORATORY DATA:  I have reviewed the data as listed Lab Results  Component Value Date   WBC 4.4 06/18/2023   HGB 13.7 06/18/2023   HCT 39.1 06/18/2023   MCV 95.8 06/18/2023   PLT 113 (L) 06/18/2023     Chemistry      Component Value Date/Time   NA 131 (L) 06/16/2023 1624   K 4.1 06/16/2023 1624   CL 98 06/16/2023 1624   CO2 25 06/16/2023 1624   BUN 32 (H) 06/16/2023 1624   CREATININE 1.31 (H) 06/16/2023 1624   CREATININE 1.01 06/11/2023 1133      Component Value Date/Time   CALCIUM 8.3 (L) 06/16/2023 1624   ALKPHOS 63 03/06/2023 1255   AST 13 (L) 03/06/2023 1255   ALT 8  03/06/2023 1255   BILITOT 0.5 03/06/2023  1255       RADIOGRAPHIC STUDIES: I have personally reviewed the radiological images as listed and agreed with the findings in the report. DG Chest Portable 1 View  Result Date: 06/16/2023 CLINICAL DATA:  Hypotension, dizziness, near syncope, history of head and neck cancer EXAM: PORTABLE CHEST 1 VIEW COMPARISON:  03/11/2015 FINDINGS: 2 frontal views of the chest demonstrate an unremarkable cardiac silhouette. Stable dilation and ectasia of the thoracic aorta. No acute airspace disease, effusion, or pneumothorax. Right chest wall port via internal jugular approach tip overlying superior vena cava. IMPRESSION: 1. No acute intrathoracic process. 2. The small cavitating right upper lobe nodule seen on prior PET scan and CT is not well visualized by x-ray. 3. Stable dilation and ectasia of the thoracic aorta, compatible with known thoracic aortic aneurysm. Electronically Signed   By: Sharlet Salina M.D.   On: 06/16/2023 17:13   IR Gastrostomy Tube  Result Date: 05/28/2023 INDICATION: Head and neck cancer, malnutrition need for long-term enteric access EXAM: Placement of percutaneous gastrostomy tube using fluoroscopic guidance MEDICATIONS: Documented in the EMR ANESTHESIA/SEDATION: Moderate (conscious) sedation was employed during this procedure. A total of Versed 1 mg and Fentanyl 50 mcg was administered intravenously by the radiology nurse. Total intra-service moderate Sedation Time: 19 minutes. The patient's level of consciousness and vital signs were monitored continuously by radiology nursing throughout the procedure under my direct supervision. CONTRAST:  30 mL Omnipaque 300-administered into the gastric lumen. FLUOROSCOPY: Radiation Exposure Index (as provided by the fluoroscopic device): 3.8 minutes (42 mGy) COMPLICATIONS: None immediate. PROCEDURE: Informed written consent was obtained from the patient after a thorough discussion of the procedural risks,  benefits and alternatives. All questions were addressed. Maximal Sterile Barrier Technique was utilized including caps, mask, sterile gowns, sterile gloves, sterile drape, hand hygiene and skin antiseptic. A timeout was performed prior to the initiation of the procedure. Review of pre-procedure CT scan demonstrates an adequate window for percutaneous placement of a gastrostomy tube. The patient was placed supine on the exam table. Using fluoroscopic guidance, an angled 5 French catheter was passed through the nares into the stomach. It was secured to the nose, and the stomach was insufflated with air. The abdomen was prepped and draped in the standard sterile fashion. After insufflating the stomach with air, puncture sites were selected and local analgesia was obtained with 1% lidocaine. Using fluoroscopic guidance, a gastropexy needle was advanced into the stomach and the T-bar suture was released. Entry into the stomach was confirmed with fluoroscopy, aspiration of air, and injection of contrast material. This was repeated with an additional gastropexy suture (for a total of 2 fasteners). At the center of these gastropexy sutures, a dermatotomy was performed. An 18 gauge needle was then passed into the stomach, and position within the gastric lumen again confirmed under fluoroscopy using aspiration of air and injection of contrast material. An Amplatz guidewire was passed through this needle and intraluminal placement was confirmed with fluoroscopy. The needle was removed, and over the guidewire, a 20 French balloon gastrostomy tube with a coaxial 10 mm balloon was advanced into the percutaneous tract. Dilation of the percutaneous tract was then performed using the balloon, followed by advancement of the gastrostomy tube into the gastric lumen. The retention balloon was then inflated with 20 mL of sterile water, and the tube was brought back to the gastric wall. The wire and balloon were removed. The external  bumper was brought to the skin. Location of the gastrostomy tube within the  stomach was then confirmed with injection of contrast material, opacifying the gastric lumen. The gastrostomy tube was flushed with sterile water, and secured to the skin using a dressing. The patient tolerated the procedure well without immediate complication, and was transferred to recovery in stable condition. IMPRESSION: Successful placement of a percutaneous 20 French balloon retention gastrostomy tube using fluoroscopic guidance. Gastrostomy tube may be used after 4 hours. Electronically Signed   By: Olive Bass M.D.   On: 05/28/2023 12:02   IR IMAGING GUIDED PORT INSERTION  Result Date: 05/28/2023 INDICATION: Head and neck cancer, need for chemotherapy access EXAM: Chest port placement using ultrasound and fluoroscopic guidance MEDICATIONS: Documented in the EMR ANESTHESIA/SEDATION: Moderate (conscious) sedation was employed during this procedure. A total of Versed 3 mg and Fentanyl 150 mcg was administered intravenously. Moderate Sedation Time: 30 minutes. The patient's level of consciousness and vital signs were monitored continuously by radiology nursing throughout the procedure under my direct supervision. FLUOROSCOPY TIME:  Fluoroscopy Time: 0.5 minutes (1 mGy) COMPLICATIONS: None immediate. PROCEDURE: Informed written consent was obtained from the patient after a thorough discussion of the procedural risks, benefits and alternatives. All questions were addressed. Maximal Sterile Barrier Technique was utilized including caps, mask, sterile gowns, sterile gloves, sterile drape, hand hygiene and skin antiseptic. A timeout was performed prior to the initiation of the procedure. The patient was placed supine on the exam table. The right neck and chest was prepped and draped in the standard sterile fashion. A preliminary ultrasound of the right neck was performed and demonstrates a patent right internal jugular vein. A permanent  ultrasound image was stored in the electronic medical record. The overlying skin was anesthetized with 1% Lidocaine. Using ultrasound guidance, access was obtained into the right internal jugular vein using a 21 gauge micropuncture set. A wire was advanced into the SVC, a short incision was made at the puncture site, and serial dilatation performed. Next, in an ipsilateral infraclavicular location, an incision was made at the site of the subcutaneous reservoir. Blunt dissection was used to open a pocket to contain the reservoir. A subcutaneous tunnel was then created from the port site to the puncture site. A(n) 8 Fr single lumen catheter was advanced through the tunnel. The catheter was attached to the port and this was placed in the subcutaneous pocket. Under fluoroscopic guidance, a peel away sheath was placed, and the catheter was trimmed to the appropriate length and was advanced into the central veins. The catheter length is 27 cm. The tip of the catheter lies near the superior cavoatrial junction. The port flushes and aspirates appropriately. The port was flushed and locked with heparinized saline. The port pocket was closed in 2 layers using 3-0 and 4-0 Vicryl/absorbable suture. Dermabond was also applied to both incisions. The patient tolerated the procedure well and was transferred to recovery in stable condition. IMPRESSION: Successful placement of a right-sided chest port via the right internal jugular vein. The port is ready for immediate use. Electronically Signed   By: Olive Bass M.D.   On: 05/28/2023 11:55    All questions were answered. The patient knows to call the clinic with any problems, questions or concerns.  I spent 30 minutes in the care of this patientincluding H and P, review of records, counseling and coordination of care.     Rachel Moulds, MD 06/18/2023 9:48 AM

## 2023-06-19 ENCOUNTER — Other Ambulatory Visit: Payer: Self-pay

## 2023-06-19 ENCOUNTER — Telehealth: Payer: Self-pay | Admitting: Hematology and Oncology

## 2023-06-19 ENCOUNTER — Ambulatory Visit
Admission: RE | Admit: 2023-06-19 | Discharge: 2023-06-19 | Disposition: A | Discharge status: Home / Self Care | Payer: Medicare Other | Source: Ambulatory Visit | Attending: Radiation Oncology | Admitting: Radiation Oncology

## 2023-06-19 DIAGNOSIS — R350 Frequency of micturition: Secondary | ICD-10-CM | POA: Diagnosis not present

## 2023-06-19 LAB — RAD ONC ARIA SESSION SUMMARY
Course Elapsed Days: 27
Plan Fractions Treated to Date: 19
Plan Prescribed Dose Per Fraction: 2 Gy
Plan Total Fractions Prescribed: 30
Plan Total Prescribed Dose: 60 Gy
Reference Point Dosage Given to Date: 38 Gy
Reference Point Session Dosage Given: 2 Gy
Session Number: 19

## 2023-06-19 NOTE — Telephone Encounter (Signed)
Spoke with patient adding appointment

## 2023-06-20 ENCOUNTER — Other Ambulatory Visit: Payer: Self-pay

## 2023-06-20 ENCOUNTER — Ambulatory Visit
Admission: RE | Admit: 2023-06-20 | Discharge: 2023-06-20 | Disposition: A | Discharge status: Home / Self Care | Payer: Medicare Other | Source: Ambulatory Visit | Attending: Radiation Oncology | Admitting: Radiation Oncology

## 2023-06-20 DIAGNOSIS — R350 Frequency of micturition: Secondary | ICD-10-CM | POA: Diagnosis not present

## 2023-06-20 LAB — RAD ONC ARIA SESSION SUMMARY
Course Elapsed Days: 28
Plan Fractions Treated to Date: 20
Plan Prescribed Dose Per Fraction: 2 Gy
Plan Total Fractions Prescribed: 30
Plan Total Prescribed Dose: 60 Gy
Reference Point Dosage Given to Date: 40 Gy
Reference Point Session Dosage Given: 2 Gy
Session Number: 20

## 2023-06-21 ENCOUNTER — Other Ambulatory Visit: Payer: Self-pay

## 2023-06-21 ENCOUNTER — Ambulatory Visit
Admission: RE | Admit: 2023-06-21 | Discharge: 2023-06-21 | Disposition: A | Discharge status: Home / Self Care | Payer: Medicare Other | Source: Ambulatory Visit | Attending: Radiation Oncology | Admitting: Radiation Oncology

## 2023-06-21 DIAGNOSIS — R350 Frequency of micturition: Secondary | ICD-10-CM | POA: Diagnosis not present

## 2023-06-21 LAB — RAD ONC ARIA SESSION SUMMARY
Course Elapsed Days: 29
Plan Fractions Treated to Date: 21
Plan Prescribed Dose Per Fraction: 2 Gy
Plan Total Fractions Prescribed: 30
Plan Total Prescribed Dose: 60 Gy
Reference Point Dosage Given to Date: 42 Gy
Reference Point Session Dosage Given: 2 Gy
Session Number: 21

## 2023-06-22 ENCOUNTER — Other Ambulatory Visit: Payer: Self-pay

## 2023-06-22 ENCOUNTER — Inpatient Hospital Stay: Payer: Medicare Other

## 2023-06-22 ENCOUNTER — Ambulatory Visit
Admission: RE | Admit: 2023-06-22 | Discharge: 2023-06-22 | Disposition: A | Discharge status: Home / Self Care | Payer: Medicare Other | Source: Ambulatory Visit | Attending: Radiation Oncology | Admitting: Radiation Oncology

## 2023-06-22 VITALS — BP 131/75 | HR 65 | Temp 97.7°F | Resp 18 | Wt 181.8 lb

## 2023-06-22 DIAGNOSIS — R350 Frequency of micturition: Secondary | ICD-10-CM | POA: Diagnosis not present

## 2023-06-22 DIAGNOSIS — C090 Malignant neoplasm of tonsillar fossa: Secondary | ICD-10-CM

## 2023-06-22 LAB — RAD ONC ARIA SESSION SUMMARY
Course Elapsed Days: 30
Plan Fractions Treated to Date: 22
Plan Prescribed Dose Per Fraction: 2 Gy
Plan Total Fractions Prescribed: 30
Plan Total Prescribed Dose: 60 Gy
Reference Point Dosage Given to Date: 44 Gy
Reference Point Session Dosage Given: 2 Gy
Session Number: 22

## 2023-06-22 MED ORDER — HEPARIN SOD (PORK) LOCK FLUSH 100 UNIT/ML IV SOLN
500.0000 [IU] | Freq: Once | INTRAVENOUS | Status: AC
  Administered 2023-06-22: 500 [IU] via INTRAVENOUS

## 2023-06-22 MED ORDER — SODIUM CHLORIDE 0.9 % IV SOLN: Freq: Once | INTRAVENOUS | Status: AC

## 2023-06-22 MED ORDER — SODIUM CHLORIDE 0.9% FLUSH
10.0000 mL | INTRAVENOUS | Status: DC | PRN
  Administered 2023-06-22: 10 mL via INTRAVENOUS

## 2023-06-22 NOTE — Progress Notes (Signed)
Per Dr.Iruku's encounter note from 06/18/2023 Pt to receive 1 L of NS over 2 hours weekly.

## 2023-06-25 ENCOUNTER — Other Ambulatory Visit: Payer: Self-pay

## 2023-06-25 ENCOUNTER — Ambulatory Visit
Admission: RE | Admit: 2023-06-25 | Discharge: 2023-06-25 | Disposition: A | Discharge status: Home / Self Care | Payer: Medicare Other | Source: Ambulatory Visit | Attending: Hematology and Oncology | Admitting: Hematology and Oncology

## 2023-06-25 ENCOUNTER — Ambulatory Visit
Admission: RE | Admit: 2023-06-25 | Discharge: 2023-06-25 | Disposition: A | Discharge status: Home / Self Care | Payer: Medicare Other | Source: Ambulatory Visit | Attending: Radiation Oncology | Admitting: Radiation Oncology

## 2023-06-25 DIAGNOSIS — C090 Malignant neoplasm of tonsillar fossa: Secondary | ICD-10-CM | POA: Diagnosis present

## 2023-06-25 DIAGNOSIS — Z510 Encounter for antineoplastic radiation therapy: Secondary | ICD-10-CM | POA: Insufficient documentation

## 2023-06-25 DIAGNOSIS — R1310 Dysphagia, unspecified: Secondary | ICD-10-CM | POA: Diagnosis not present

## 2023-06-25 DIAGNOSIS — Z5111 Encounter for antineoplastic chemotherapy: Secondary | ICD-10-CM | POA: Insufficient documentation

## 2023-06-25 DIAGNOSIS — F17210 Nicotine dependence, cigarettes, uncomplicated: Secondary | ICD-10-CM | POA: Insufficient documentation

## 2023-06-25 LAB — RAD ONC ARIA SESSION SUMMARY
Course Elapsed Days: 33
Plan Fractions Treated to Date: 23
Plan Prescribed Dose Per Fraction: 2 Gy
Plan Total Fractions Prescribed: 30
Plan Total Prescribed Dose: 60 Gy
Reference Point Dosage Given to Date: 46 Gy
Reference Point Session Dosage Given: 2 Gy
Session Number: 23

## 2023-06-25 MED FILL — Dexamethasone Sodium Phosphate Inj 100 MG/10ML: INTRAMUSCULAR | Qty: 1 | Status: AC

## 2023-06-25 MED FILL — Fosaprepitant Dimeglumine For IV Infusion 150 MG (Base Eq): INTRAVENOUS | Qty: 5 | Status: AC

## 2023-06-25 NOTE — Addendum Note (Signed)
Encounter addended by: Edward Qualia on: 06/25/2023 10:21 AM  Actions taken: Imaging Exam ended

## 2023-06-25 NOTE — Addendum Note (Signed)
Encounter addended by: Theda Payer C on: 06/25/2023 10:21 AM  Actions taken: Imaging Exam ended

## 2023-06-26 ENCOUNTER — Other Ambulatory Visit: Payer: Self-pay

## 2023-06-26 ENCOUNTER — Inpatient Hospital Stay: Payer: Medicare Other | Admitting: Dietician

## 2023-06-26 ENCOUNTER — Other Ambulatory Visit: Payer: Self-pay | Admitting: *Deleted

## 2023-06-26 ENCOUNTER — Encounter: Payer: Self-pay | Admitting: Adult Health

## 2023-06-26 ENCOUNTER — Ambulatory Visit
Admission: RE | Admit: 2023-06-26 | Discharge: 2023-06-26 | Disposition: A | Discharge status: Home / Self Care | Payer: Medicare Other | Source: Ambulatory Visit | Attending: Radiation Oncology | Admitting: Radiation Oncology

## 2023-06-26 ENCOUNTER — Inpatient Hospital Stay: Payer: Medicare Other

## 2023-06-26 ENCOUNTER — Inpatient Hospital Stay: Payer: Medicare Other | Admitting: Adult Health

## 2023-06-26 ENCOUNTER — Encounter: Payer: Self-pay | Admitting: Hematology and Oncology

## 2023-06-26 VITALS — BP 108/61 | HR 72 | Temp 97.5°F | Resp 17 | Wt 180.4 lb

## 2023-06-26 DIAGNOSIS — N419 Inflammatory disease of prostate, unspecified: Secondary | ICD-10-CM | POA: Insufficient documentation

## 2023-06-26 DIAGNOSIS — Z5111 Encounter for antineoplastic chemotherapy: Secondary | ICD-10-CM | POA: Insufficient documentation

## 2023-06-26 DIAGNOSIS — C090 Malignant neoplasm of tonsillar fossa: Secondary | ICD-10-CM

## 2023-06-26 DIAGNOSIS — F17210 Nicotine dependence, cigarettes, uncomplicated: Secondary | ICD-10-CM | POA: Insufficient documentation

## 2023-06-26 DIAGNOSIS — R1310 Dysphagia, unspecified: Secondary | ICD-10-CM | POA: Insufficient documentation

## 2023-06-26 DIAGNOSIS — G893 Neoplasm related pain (acute) (chronic): Secondary | ICD-10-CM

## 2023-06-26 DIAGNOSIS — E860 Dehydration: Secondary | ICD-10-CM | POA: Insufficient documentation

## 2023-06-26 DIAGNOSIS — Z510 Encounter for antineoplastic radiation therapy: Secondary | ICD-10-CM | POA: Diagnosis not present

## 2023-06-26 DIAGNOSIS — Z452 Encounter for adjustment and management of vascular access device: Secondary | ICD-10-CM | POA: Insufficient documentation

## 2023-06-26 DIAGNOSIS — R634 Abnormal weight loss: Secondary | ICD-10-CM | POA: Insufficient documentation

## 2023-06-26 DIAGNOSIS — N3090 Cystitis, unspecified without hematuria: Secondary | ICD-10-CM

## 2023-06-26 DIAGNOSIS — N410 Acute prostatitis: Secondary | ICD-10-CM | POA: Diagnosis not present

## 2023-06-26 DIAGNOSIS — Z95828 Presence of other vascular implants and grafts: Secondary | ICD-10-CM

## 2023-06-26 LAB — BASIC METABOLIC PANEL - CANCER CENTER ONLY
Anion gap: 5 (ref 5–15)
BUN: 22 mg/dL (ref 8–23)
CO2: 29 mmol/L (ref 22–32)
Calcium: 8.7 mg/dL — ABNORMAL LOW (ref 8.9–10.3)
Chloride: 100 mmol/L (ref 98–111)
Creatinine: 0.95 mg/dL (ref 0.61–1.24)
GFR, Estimated: >60 mL/min (ref >60)
Glucose, Bld: 132 mg/dL — ABNORMAL HIGH (ref 70–99)
Potassium: 4 mmol/L (ref 3.5–5.1)
Sodium: 134 mmol/L — ABNORMAL LOW (ref 135–145)

## 2023-06-26 LAB — RAD ONC ARIA SESSION SUMMARY
Course Elapsed Days: 34
Plan Fractions Treated to Date: 24
Plan Prescribed Dose Per Fraction: 2 Gy
Plan Total Fractions Prescribed: 30
Plan Total Prescribed Dose: 60 Gy
Reference Point Dosage Given to Date: 48 Gy
Reference Point Session Dosage Given: 2 Gy
Session Number: 24

## 2023-06-26 LAB — CBC WITH DIFFERENTIAL (CANCER CENTER ONLY)
Abs Immature Granulocytes: 0.02 10*3/uL (ref 0.00–0.07)
Basophils Absolute: 0 10*3/uL (ref 0.0–0.1)
Basophils Relative: 1 %
Eosinophils Absolute: 0 10*3/uL (ref 0.0–0.5)
Eosinophils Relative: 0 %
HCT: 35.5 % — ABNORMAL LOW (ref 39.0–52.0)
Hemoglobin: 12.8 g/dL — ABNORMAL LOW (ref 13.0–17.0)
Immature Granulocytes: 1 %
Lymphocytes Relative: 9 %
Lymphs Abs: 0.3 10*3/uL — ABNORMAL LOW (ref 0.7–4.0)
MCH: 35 pg — ABNORMAL HIGH (ref 26.0–34.0)
MCHC: 36.1 g/dL — ABNORMAL HIGH (ref 30.0–36.0)
MCV: 97 fL (ref 80.0–100.0)
Monocytes Absolute: 0.3 10*3/uL (ref 0.1–1.0)
Monocytes Relative: 11 %
Neutro Abs: 2.3 10*3/uL (ref 1.7–7.7)
Neutrophils Relative %: 78 %
Platelet Count: 100 10*3/uL — ABNORMAL LOW (ref 150–400)
RBC: 3.66 MIL/uL — ABNORMAL LOW (ref 4.22–5.81)
RDW: 12.6 % (ref 11.5–15.5)
WBC Count: 3 10*3/uL — ABNORMAL LOW (ref 4.0–10.5)
nRBC: 0 % (ref 0.0–0.2)

## 2023-06-26 LAB — MAGNESIUM: Magnesium: 1.8 mg/dL (ref 1.7–2.4)

## 2023-06-26 MED ORDER — MAGNESIUM SULFATE 2 GM/50ML IV SOLN
2.0000 g | Freq: Once | INTRAVENOUS | Status: AC
  Administered 2023-06-26: 2 g via INTRAVENOUS
  Filled 2023-06-26: qty 50

## 2023-06-26 MED ORDER — SODIUM CHLORIDE 0.9 % IV SOLN
30.0000 mg/m2 | Freq: Once | INTRAVENOUS | Status: AC
  Administered 2023-06-26: 65 mg via INTRAVENOUS
  Filled 2023-06-26: qty 65

## 2023-06-26 MED ORDER — POTASSIUM CHLORIDE IN NACL 20-0.9 MEQ/L-% IV SOLN
Freq: Once | INTRAVENOUS | Status: AC
  Filled 2023-06-26: qty 1000

## 2023-06-26 MED ORDER — OXYCODONE HCL 5 MG PO TABS
5.0000 mg | ORAL_TABLET | Freq: Four times a day (QID) | ORAL | 0 refills | Status: DC | PRN
Start: 2023-06-26 — End: 2023-07-31

## 2023-06-26 MED ORDER — HEPARIN SOD (PORK) LOCK FLUSH 100 UNIT/ML IV SOLN
500.0000 [IU] | Freq: Once | INTRAVENOUS | Status: AC | PRN
  Administered 2023-06-26: 500 [IU]

## 2023-06-26 MED ORDER — SULFAMETHOXAZOLE-TRIMETHOPRIM 800-160 MG PO TABS
1.0000 | ORAL_TABLET | Freq: Two times a day (BID) | ORAL | 0 refills | Status: DC
Start: 2023-06-26 — End: 2024-02-20

## 2023-06-26 MED ORDER — MORPHINE SULFATE (PF) 2 MG/ML IV SOLN
2.0000 mg | Freq: Once | INTRAVENOUS | Status: AC
  Administered 2023-06-26: 2 mg via INTRAVENOUS
  Filled 2023-06-26: qty 1

## 2023-06-26 MED ORDER — SODIUM CHLORIDE 0.9% FLUSH
10.0000 mL | INTRAVENOUS | Status: DC | PRN
  Administered 2023-06-26: 10 mL

## 2023-06-26 MED ORDER — SODIUM CHLORIDE 0.9 % IV SOLN
150.0000 mg | Freq: Once | INTRAVENOUS | Status: AC
  Administered 2023-06-26: 150 mg via INTRAVENOUS
  Filled 2023-06-26: qty 150

## 2023-06-26 MED ORDER — SODIUM CHLORIDE 0.9 % IV SOLN: Freq: Once | INTRAVENOUS | Status: AC

## 2023-06-26 MED ORDER — PALONOSETRON HCL INJECTION 0.25 MG/5ML
0.2500 mg | Freq: Once | INTRAVENOUS | Status: AC
  Administered 2023-06-26: 0.25 mg via INTRAVENOUS
  Filled 2023-06-26: qty 5

## 2023-06-26 MED ORDER — SODIUM CHLORIDE 0.9% FLUSH
10.0000 mL | Freq: Once | INTRAVENOUS | Status: AC
  Administered 2023-06-26: 10 mL

## 2023-06-26 MED ORDER — SODIUM CHLORIDE 0.9 % IV SOLN
10.0000 mg | Freq: Once | INTRAVENOUS | Status: AC
  Administered 2023-06-26: 10 mg via INTRAVENOUS
  Filled 2023-06-26: qty 10

## 2023-06-26 NOTE — Assessment & Plan Note (Signed)
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  He is here today for evaluation prior to receiving Cisplatin.  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  His labs today are stable. Dehydration: His BUN has normalized along with his creatinine.  He is unsure if he wants to receive IV fluids on Friday.  I let him know that if he desired to cancel he certainly could however it would be going into the weekend and it may be beneficial for him to keep his appointment and receive the fluids.  He is using his G-tube without difficulty Prostatitis: I refilled his Bactrim DS.  He is not yet increased his Flomax to 0.8 mg. Odynophagia: I refilled his oxycodone for his cancer related pain.  I sent in #45 for him to take every 6 hours as needed.  We reviewed goals of pain management which include that he is able to maintain his function and not be in immense pain.  While decreasing side effects such as constipation and increased somnolence.  He verbalized understanding of this.  I offered to send him in Magic mouthwash for him to swish and swallow 4 times a day as needed however he declined. Weight loss: He will continue to see nutrition during his chemotherapy.  He is tolerating his tube feeds well.    [name] will return daily for his radiation and we will continue to see him weekly prior to his Cisplatin.

## 2023-06-26 NOTE — Progress Notes (Signed)
Nutrition Follow-up:  Patient with SCC of right tonsil, p16 positive s/p TORS resection under the care of Dr. Hezzie Bump 04/06/23. He is receiving concurrent chemoradiation with weekly cisplatin (first 5/28)   S/p PEG 6/3 DME: [employer]   Noted pt seen in ED 6/22 secondary to syncope s/p IVF  Met with patient in infusion. Wife is present. Patient reports increased pain in the last 24-48 hours. His throat is raw. Patient having difficulty swallowing due to this. He took oral pain medication ~8AM. Patient appears uncomfortable during visit. Patient did not bring pain medication to [facility]. He is trying to eat orally. Patient tolerated a small bowl of grits for breakfast. He is planning to have soup for lunch. Patient is tolerating 4-6 cartons of Osmolite 1.5. Wife reports daily carton amount depends on oral intake. Patient denies nausea, vomiting, diarrhea, constipation. Patient is taking miralax as needed   Medications: reviewed   Labs: Na 134  Anthropometrics: Wt 180 lb 6.4 oz today   6/24 - 180 lb 6/18 - 185 lb 9.6 oz 6/11 - 188 lb 9.6 oz 6/4 - 194 lb 4.8 oz  5/28 - 196 lb 3.2 oz    INTERVENTION:  Continue miralax as needed for constipation  Continue oral intake of soft smooth textures as tolerated Continue titration of tube feedings to goal with decreased oral intake Discussed pt pain with infusion RN - secure chat sent to MD  Continue supportive therapy with IV hydration    MONITORING, EVALUATION, GOAL: weight trends, intake, TF    NEXT VISIT: Wednesday July 10 during infusion

## 2023-06-26 NOTE — Progress Notes (Signed)
[facility] Cancer Follow up:    Practice, [facility] [address] [place] [address]   DIAGNOSIS:  Cancer Staging  Cancer of tonsillar fossa [facility]) Staging form: Pharynx - HPV-Mediated Oropharynx, AJCC 8th Edition - Pathologic stage from 04/27/2023: Stage II (pT2, pN2, cM0, p16+) - Signed by Lonie Peak, MD on 04/27/2023 Stage prefix: Initial diagnosis   SUMMARY OF ONCOLOGIC HISTORY: Oncology History  Cancer of tonsillar fossa (HCC)  02/07/2023 Initial Biopsy   Biopsy of the right tonsil  revealed: squamous cell carcinoma, p16 positive.    03/12/2023 PET scan   Right tonsillar mass as tracer avid and concerning for malignancy; hypermetabolic right level 2 and level 3 cervical lymph nodes compatible with metastatic adenopathy; indeterminate tracer avid right upper lobe elongated area of increased soft tissue thickening within the medial right upper lobe (SUV max of 5.08); and nonspecific mild tracer avid left hilar lymph nodes.    03/20/2023 Miscellaneous   Dentistry evaluation: Extractions of #18 and #19 with Dr. Azucena Cecil   04/06/2023 Surgery   TORS resection (Dr. Hezzie Bump): 2.7 cm tumor, histology of nonkeratinizing squamous cell carcinoma with focal maturation, HPV associated; + LVI and PNI; all margins negative.  8/12 + LN including right level 2A, 3, and 4 being +.  Extranodal extension present, largest tumor deposit 2.6cm.     04/27/2023 Cancer Staging   Staging form: Pharynx - HPV-Mediated Oropharynx, AJCC 8th Edition - Pathologic stage from 04/27/2023: Stage II (pT2, pN2, cM0, p16+) - Signed by Lonie Peak, MD on 04/27/2023 Stage prefix: Initial diagnosis   05/22/2023 -  Chemotherapy   Patient is on Treatment Plan : HEAD/NECK Cisplatin (40) q7d     05/23/2023 - 07/04/2023 Radiation Therapy   Concurrent chemoradiation     CURRENT THERAPY: Concurrent chemoradiation  INTERVAL HISTORY: Ethan [name] 72 y.o. male returns for f/u prior to chemo.   His weight is stable compared to 06/18/2023.  He did have some issues with syncope and dehydration few weeks ago.  Since then he has been getting IV fluids once a week and he is feeling better.  He does have some urinary burning again consistent with prostatitis.  The Bactrim DS he took a couple weeks ago greatly improved his symptoms and he is hoping to get another prescription for this.  He declined to see Dr. Celso Amy again to help manage his prostatitis.  His throat is burning and sore and he is requesting a refill of his oxycodone.  He is taking about 2 tablets a day.   Patient Active Problem List   Diagnosis Date Noted   Port-A-Cath in place 05/29/2023   Cancer of tonsillar fossa (HCC) 03/07/2023   Arthralgia 08/29/2018   Weight loss 08/29/2018   Ureteral calculus, right 03/11/2015   Ureteral calculus 03/11/2015   Aftercare following surgery of the circulatory system, NEC 01/12/2014   Abdominal aneurysm without mention of rupture 07/08/2012    has No Known Allergies.  MEDICAL HISTORY: Past Medical History:  Diagnosis Date   AAA (abdominal aortic aneurysm) (HCC)    Hyperlipidemia    Kidney stones    Renal disorder     SURGICAL HISTORY: Past Surgical History:  Procedure Laterality Date   ABDOMINAL AORTIC ANEURYSM REPAIR  06/15/2011   EVAR   CYSTOSCOPY WITH RETROGRADE PYELOGRAM, URETEROSCOPY AND STENT PLACEMENT Right 03/11/2015   Procedure: CYSTOSCOPY WITH RETROGRADE PYELOGRAM, AND RIGHT URETERAL STENT PLACEMENT;  Surgeon: Barron Alvine, MD;  Location: [facility];  Service: Urology;  Laterality: Right;   HERNIA REPAIR     IR GASTROSTOMY TUBE MOD SED  05/28/2023   IR IMAGING GUIDED PORT INSERTION  05/28/2023   IR NASO G TUBE PLC W/FL-NO RAD  05/28/2023   LOWER BACK FUSION, L2-L3,AND L3-L4     RIGHT LOWER TIB-FEM FRACTURE     SHOULDER SURGERY Bilateral    SPINE SURGERY     THROAT SURGERY  04/06/2023    SOCIAL HISTORY: Social History   Socioeconomic History   Marital status:  Married    Spouse name: Not on file   Number of children: Not on file   Years of education: Not on file   Highest education level: Not on file  Occupational History   Not on file  Tobacco Use   Smoking status: Some Days    Packs/day: .5    Types: Cigarettes   Smokeless tobacco: Never  Vaping Use   Vaping Use: Never used  Substance and Sexual Activity   Alcohol use: Yes    Alcohol/week: 0.0 standard drinks of alcohol    Comment: occ   Drug use: No   Sexual activity: Not Currently  Other Topics Concern   Not on file  Social History Narrative   Not on file   Social Determinants of Health   Financial Resource Strain: Not on file  Food Insecurity: No Food Insecurity (04/26/2023)   Hunger Vital Sign    Worried About Running Out of Food in the Last Year: Never true    Ran Out of Food in the Last Year: Never true  Transportation Needs: No Transportation Needs (04/26/2023)   PRAPARE - Administrator, Civil Service (Medical): No    Lack of Transportation (Non-Medical): No  Physical Activity: Not on file  Stress: Not on file  Social Connections: Not on file  Intimate Partner Violence: Not At Risk (04/26/2023)   Humiliation, Afraid, Rape, and Kick questionnaire    Fear of Current or Ex-Partner: No    Emotionally Abused: No    Physically Abused: No    Sexually Abused: No    FAMILY HISTORY: Family History  Problem Relation Age of Onset   Aneurysm Father    Stroke Mother    Heart disease Mother        Aneurysm   Hypertension Sister    Alcohol abuse Brother    Cancer Brother        mouth cancer   Aneurysm Paternal Uncle     Review of Systems  Constitutional:  Positive for fatigue. Negative for appetite change, chills, fever and unexpected weight change.  HENT:   Positive for mouth sores and sore throat. Negative for hearing loss, lump/mass and trouble swallowing.   Eyes:  Negative for eye problems and icterus.  Respiratory:  Negative for chest tightness, cough  and shortness of breath.   Cardiovascular:  Negative for chest pain, leg swelling and palpitations.  Gastrointestinal:  Negative for abdominal distention, abdominal pain, constipation, diarrhea, nausea and vomiting.  Endocrine: Negative for hot flashes.  Genitourinary:  Negative for difficulty urinating.   Musculoskeletal:  Negative for arthralgias.  Skin:  Negative for itching and rash.  Neurological:  Negative for dizziness, extremity weakness, headaches and numbness.  Hematological:  Negative for adenopathy. Does not bruise/bleed easily.  Psychiatric/Behavioral:  Negative for depression. The patient is not nervous/anxious.       PHYSICAL EXAMINATION   Onc Performance Status - 06/26/23 0900       KPS SCALE   KPS %  SCORE Normal activity with effort, some s/s of disease             Vitals:   06/26/23 0954  BP: 108/61  Pulse: 72  Resp: 17  Temp: (!) 97.5 F (36.4 C)  SpO2: 98%    Physical Exam Constitutional:      General: He is not in acute distress.    Appearance: Normal appearance. He is not toxic-appearing.  HENT:     Head: Normocephalic and atraumatic.     Mouth/Throat:     Mouth: Mucous membranes are moist.     Pharynx: Oropharyngeal exudate and posterior oropharyngeal erythema present.  Eyes:     General: No scleral icterus. Cardiovascular:     Rate and Rhythm: Normal rate and regular rhythm.     Pulses: Normal pulses.     Heart sounds: Normal heart sounds.  Pulmonary:     Effort: Pulmonary effort is normal.     Breath sounds: Normal breath sounds.  Abdominal:     General: Abdomen is flat. Bowel sounds are normal. There is no distension.     Palpations: Abdomen is soft.     Tenderness: There is no abdominal tenderness.  Musculoskeletal:        General: No swelling.     Cervical back: Neck supple.  Lymphadenopathy:     Cervical: No cervical adenopathy.  Skin:    General: Skin is warm and dry.     Findings: No rash.  Neurological:     General:  No focal deficit present.     Mental Status: He is alert.  Psychiatric:        Mood and Affect: Mood normal.        Behavior: Behavior normal.     LABORATORY DATA:  CBC    Component Value Date/Time   WBC 3.0 (L) 06/26/2023 0848   WBC 4.8 06/16/2023 1624   RBC 3.66 (L) 06/26/2023 0848   HGB 12.8 (L) 06/26/2023 0848   HCT 35.5 (L) 06/26/2023 0848   PLT 100 (L) 06/26/2023 0848   MCV 97.0 06/26/2023 0848   MCH 35.0 (H) 06/26/2023 0848   MCHC 36.1 (H) 06/26/2023 0848   RDW 12.6 06/26/2023 0848   LYMPHSABS 0.3 (L) 06/26/2023 0848   MONOABS 0.3 06/26/2023 0848   EOSABS 0.0 06/26/2023 0848   BASOSABS 0.0 06/26/2023 0848    CMP     Component Value Date/Time   NA 134 (L) 06/26/2023 0848   K 4.0 06/26/2023 0848   CL 100 06/26/2023 0848   CO2 29 06/26/2023 0848   GLUCOSE 132 (H) 06/26/2023 0848   BUN 22 06/26/2023 0848   CREATININE 0.95 06/26/2023 0848   CALCIUM 8.7 (L) 06/26/2023 0848   PROT 7.2 03/06/2023 1255   ALBUMIN 4.3 03/06/2023 1255   AST 13 (L) 03/06/2023 1255   ALT 8 03/06/2023 1255   ALKPHOS 63 03/06/2023 1255   BILITOT 0.5 03/06/2023 1255   GFRNONAA >60 06/26/2023 0848   GFRAA 49 (L) 03/12/2015 0505        ASSESSMENT and THERAPY PLAN:   Cancer of tonsillar fossa (HCC) Ethan [name] is a 72 year old man with 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  He is here today for evaluation prior to receiving Cisplatin.  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  His labs today are stable. Dehydration: His BUN has normalized along with his creatinine.  He is unsure if he wants to receive IV fluids on Friday.  I let him know that if he desired to cancel he certainly could however it would be going into the weekend and it may be beneficial for him to keep his appointment and receive the fluids.  He is using his G-tube without  difficulty Prostatitis: I refilled his Bactrim DS.  He is not yet increased his Flomax to 0.8 mg. Odynophagia: I refilled his oxycodone for his cancer related pain.  I sent in #45 for him to take every 6 hours as needed.  We reviewed goals of pain management which include that he is able to maintain his function and not be in immense pain.  While decreasing side effects such as constipation and increased somnolence.  He verbalized understanding of this.  I offered to send him in Magic mouthwash for him to swish and swallow 4 times a day as needed however he declined. Weight loss: He will continue to see nutrition during his chemotherapy.  He is tolerating his tube feeds well.    Shann will return daily for his radiation and we will continue to see him weekly prior to his Cisplatin.    All questions were answered. The patient knows to call the clinic with any problems, questions or concerns. We can certainly see the patient much sooner if necessary.  Total encounter time:40 minutes*in face-to-face visit time, chart review, lab review, care coordination, order entry, and documentation of the encounter time.    Lillard Anes, NP 06/26/23 10:43 AM Medical Oncology and Hematology [facility] [address] [place], [place] [address] Tel. [phone]    Fax. [phone]  *Total Encounter Time as defined by the Centers for Medicare and Medicaid Services includes, in addition to the face-to-face time of a patient visit (documented in the note above) non-face-to-face time: obtaining and reviewing outside history, ordering and reviewing medications, tests or procedures, care coordination (communications with other health care professionals or caregivers) and documentation in the medical record.

## 2023-06-26 NOTE — Patient Instructions (Signed)
[facility]  Discharge Instructions: Thank you for choosing [facility] to provide your oncology and hematology care.   If you have a lab appointment with the Cancer Center, please go directly to the Cancer Center and check in at the registration area.   Wear comfortable clothing and clothing appropriate for easy access to any Portacath or PICC line.   We strive to give you quality time with your provider. You may need to reschedule your appointment if you arrive late (15 or more minutes).  Arriving late affects you and other patients whose appointments are after yours.  Also, if you miss three or more appointments without notifying the office, you may be dismissed from the clinic at the provider's discretion.      For prescription refill requests, have your pharmacy contact our office and allow 72 hours for refills to be completed.    Today you received the following chemotherapy and/or immunotherapy agent: Cisplatin   To help prevent nausea and vomiting after your treatment, we encourage you to take your nausea medication as directed.  BELOW ARE SYMPTOMS THAT SHOULD BE REPORTED IMMEDIATELY: *FEVER GREATER THAN 100.4 F (38 C) OR HIGHER *CHILLS OR SWEATING *NAUSEA AND VOMITING THAT IS NOT CONTROLLED WITH YOUR NAUSEA MEDICATION *UNUSUAL SHORTNESS OF BREATH *UNUSUAL BRUISING OR BLEEDING *URINARY PROBLEMS (pain or burning when urinating, or frequent urination) *BOWEL PROBLEMS (unusual diarrhea, constipation, pain near the anus) TENDERNESS IN MOUTH AND THROAT WITH OR WITHOUT PRESENCE OF ULCERS (sore throat, sores in mouth, or a toothache) UNUSUAL RASH, SWELLING OR PAIN  UNUSUAL VAGINAL DISCHARGE OR ITCHING   Items with * indicate a potential emergency and should be followed up as soon as possible or go to the Emergency Department if any problems should occur.  Please show the CHEMOTHERAPY ALERT CARD or IMMUNOTHERAPY ALERT CARD at check-in  to the Emergency Department and triage nurse.  Should you have questions after your visit or need to cancel or reschedule your appointment, please contact  [facility]  Dept: [phone]  and follow the prompts.  Office hours are 8:00 a.m. to 4:30 p.m. Monday - Friday. Please note that voicemails left after 4:00 p.m. may not be returned until the following business day.  We are closed weekends and major holidays. You have access to a nurse at all times for urgent questions. Please call the main number to the clinic Dept: [phone] and follow the prompts.   For any non-urgent questions, you may also contact your provider using MyChart. We now offer e-Visits for anyone 18 and older to request care online for non-urgent symptoms. For details visit [email or web address].   Also download the MyChart app! Go to the app store, search "MyChart", open the app, select , and log in with your MyChart username and password.   

## 2023-06-27 ENCOUNTER — Ambulatory Visit
Admission: RE | Admit: 2023-06-27 | Discharge: 2023-06-27 | Disposition: A | Discharge status: Home / Self Care | Payer: Medicare Other | Source: Ambulatory Visit | Attending: Radiation Oncology | Admitting: Radiation Oncology

## 2023-06-27 ENCOUNTER — Other Ambulatory Visit: Payer: Self-pay

## 2023-06-27 DIAGNOSIS — Z510 Encounter for antineoplastic radiation therapy: Secondary | ICD-10-CM | POA: Diagnosis not present

## 2023-06-27 LAB — RAD ONC ARIA SESSION SUMMARY
Course Elapsed Days: 35
Plan Fractions Treated to Date: 25
Plan Prescribed Dose Per Fraction: 2 Gy
Plan Total Fractions Prescribed: 30
Plan Total Prescribed Dose: 60 Gy
Reference Point Dosage Given to Date: 50 Gy
Reference Point Session Dosage Given: 2 Gy
Session Number: 25

## 2023-06-29 ENCOUNTER — Other Ambulatory Visit: Payer: Self-pay

## 2023-06-29 ENCOUNTER — Ambulatory Visit
Admission: RE | Admit: 2023-06-29 | Discharge: 2023-06-29 | Disposition: A | Discharge status: Home / Self Care | Payer: Medicare Other | Source: Ambulatory Visit | Attending: Radiation Oncology | Admitting: Radiation Oncology

## 2023-06-29 ENCOUNTER — Other Ambulatory Visit: Payer: Self-pay | Admitting: *Deleted

## 2023-06-29 ENCOUNTER — Inpatient Hospital Stay: Payer: Medicare Other

## 2023-06-29 VITALS — BP 127/87 | HR 65 | Temp 97.5°F | Resp 21

## 2023-06-29 DIAGNOSIS — Z510 Encounter for antineoplastic radiation therapy: Secondary | ICD-10-CM | POA: Diagnosis not present

## 2023-06-29 DIAGNOSIS — R634 Abnormal weight loss: Secondary | ICD-10-CM

## 2023-06-29 DIAGNOSIS — M25541 Pain in joints of right hand: Secondary | ICD-10-CM

## 2023-06-29 DIAGNOSIS — Z95828 Presence of other vascular implants and grafts: Secondary | ICD-10-CM

## 2023-06-29 DIAGNOSIS — C090 Malignant neoplasm of tonsillar fossa: Secondary | ICD-10-CM

## 2023-06-29 DIAGNOSIS — N201 Calculus of ureter: Secondary | ICD-10-CM

## 2023-06-29 LAB — RAD ONC ARIA SESSION SUMMARY
Course Elapsed Days: 37
Plan Fractions Treated to Date: 26
Plan Prescribed Dose Per Fraction: 2 Gy
Plan Total Fractions Prescribed: 30
Plan Total Prescribed Dose: 60 Gy
Reference Point Dosage Given to Date: 52 Gy
Reference Point Session Dosage Given: 2 Gy
Session Number: 26

## 2023-06-29 MED ORDER — SONAFINE EX EMUL
1.0000 | Freq: Two times a day (BID) | CUTANEOUS | Status: DC
  Administered 2023-06-29: 1 via TOPICAL

## 2023-06-29 MED ORDER — HEPARIN SOD (PORK) LOCK FLUSH 100 UNIT/ML IV SOLN
500.0000 [IU] | Freq: Once | INTRAVENOUS | Status: AC
  Administered 2023-06-29: 500 [IU]

## 2023-06-29 MED ORDER — SODIUM CHLORIDE 0.9 % IV SOLN: INTRAVENOUS | Status: AC

## 2023-06-29 MED ORDER — SODIUM CHLORIDE 0.9% FLUSH
10.0000 mL | Freq: Once | INTRAVENOUS | Status: AC
  Administered 2023-06-29: 10 mL

## 2023-06-29 NOTE — Patient Instructions (Signed)

## 2023-06-29 NOTE — Progress Notes (Signed)
Patient states that his SBP at home yesterday (Thursday) ranged between 60 and 90 at different points.  Patient experienced fatigue unrelieved by rest, intermittent dizziness.  Dr. Basilio Cairo notified, recommended patient come in for IVF MWF for 4 weeks until symptoms relieved.  Patient verbalized understanding, declined additional IVF appointments (currently scheduled for IVF next Friday after Tuesday infusion).  VSS at discharge, reports feeling "much more energized," ambulated to lobby.

## 2023-06-29 NOTE — Progress Notes (Signed)
Infusion Rn notified by rad onc rn of pt low bp 87/54 while seating, and 72/47 while standing. Pt to get infusion (fluids) today. Infusion Rn also notified that Dr. Basilio Cairo would like Dr. Bertis Ruddy to see pt prior to him being d/c'ed after fluids to make sure he is overall stable for d/c. Infusion Rn will message Dr. Bertis Ruddy to come see pt after getting getting fluids.

## 2023-07-02 ENCOUNTER — Other Ambulatory Visit: Payer: Self-pay

## 2023-07-02 ENCOUNTER — Ambulatory Visit
Admission: RE | Admit: 2023-07-02 | Discharge: 2023-07-02 | Disposition: A | Discharge status: Home / Self Care | Payer: Medicare Other | Source: Ambulatory Visit | Attending: Radiation Oncology | Admitting: Radiation Oncology

## 2023-07-02 DIAGNOSIS — Z510 Encounter for antineoplastic radiation therapy: Secondary | ICD-10-CM | POA: Diagnosis not present

## 2023-07-02 LAB — RAD ONC ARIA SESSION SUMMARY
Course Elapsed Days: 40
Plan Fractions Treated to Date: 27
Plan Prescribed Dose Per Fraction: 2 Gy
Plan Total Fractions Prescribed: 30
Plan Total Prescribed Dose: 60 Gy
Reference Point Dosage Given to Date: 54 Gy
Reference Point Session Dosage Given: 2 Gy
Session Number: 27

## 2023-07-03 ENCOUNTER — Other Ambulatory Visit: Payer: Self-pay

## 2023-07-03 ENCOUNTER — Ambulatory Visit
Admission: RE | Admit: 2023-07-03 | Discharge: 2023-07-03 | Disposition: A | Discharge status: Home / Self Care | Payer: Medicare Other | Source: Ambulatory Visit | Attending: Radiation Oncology | Admitting: Radiation Oncology

## 2023-07-03 ENCOUNTER — Inpatient Hospital Stay: Payer: Medicare Other

## 2023-07-03 VITALS — BP 113/72 | HR 69 | Temp 97.9°F | Resp 16

## 2023-07-03 DIAGNOSIS — N201 Calculus of ureter: Secondary | ICD-10-CM

## 2023-07-03 DIAGNOSIS — M25541 Pain in joints of right hand: Secondary | ICD-10-CM

## 2023-07-03 DIAGNOSIS — Z510 Encounter for antineoplastic radiation therapy: Secondary | ICD-10-CM | POA: Diagnosis not present

## 2023-07-03 DIAGNOSIS — Z95828 Presence of other vascular implants and grafts: Secondary | ICD-10-CM

## 2023-07-03 DIAGNOSIS — C090 Malignant neoplasm of tonsillar fossa: Secondary | ICD-10-CM

## 2023-07-03 DIAGNOSIS — R634 Abnormal weight loss: Secondary | ICD-10-CM

## 2023-07-03 LAB — RAD ONC ARIA SESSION SUMMARY
Course Elapsed Days: 41
Plan Fractions Treated to Date: 28
Plan Prescribed Dose Per Fraction: 2 Gy
Plan Total Fractions Prescribed: 30
Plan Total Prescribed Dose: 60 Gy
Reference Point Dosage Given to Date: 56 Gy
Reference Point Session Dosage Given: 2 Gy
Session Number: 28

## 2023-07-03 MED ORDER — SODIUM CHLORIDE 0.9% FLUSH
10.0000 mL | Freq: Once | INTRAVENOUS | Status: AC
  Administered 2023-07-03: 10 mL

## 2023-07-03 MED ORDER — SODIUM CHLORIDE 0.9 % IV SOLN: INTRAVENOUS | Status: AC

## 2023-07-03 MED ORDER — HEPARIN SOD (PORK) LOCK FLUSH 100 UNIT/ML IV SOLN
500.0000 [IU] | Freq: Once | INTRAVENOUS | Status: AC
  Administered 2023-07-03: 500 [IU]

## 2023-07-03 MED FILL — Dexamethasone Sodium Phosphate Inj 100 MG/10ML: INTRAMUSCULAR | Qty: 1 | Status: AC

## 2023-07-03 MED FILL — Fosaprepitant Dimeglumine For IV Infusion 150 MG (Base Eq): INTRAVENOUS | Qty: 5 | Status: AC

## 2023-07-03 NOTE — Patient Instructions (Signed)

## 2023-07-03 NOTE — Progress Notes (Signed)
Ethan [name] presents today for follow up for completion of radiation therapy for right tonsil and right neck (lymph nodes). He completed radiation on 07-05-23.   Pain issues, if any: Yes 8/10 upon swallowing. Using a feeding tube?: Yes Weight changes, if any: Yes, roughly 25 lbs x4 months Swallowing issues, if any: Yes- Severe dysphagia Smoking or chewing tobacco? None Using fluoride toothpaste daily? Yes Last ENT visit was on: 02/07/2023 w/ Dr. Christia Reading- Otolaryngology Other notable issues, if any: Moderate fatigue  Vitals- BP 107/64 (BP Location: Right Arm, Patient Position: Sitting, Cuff Size: Large)   Pulse 72   Temp 97.9 F (36.6 C) (Temporal)   Resp 20   Ht 6\' 3"  (1.905 m)   Wt 181 lb 12.8 oz (82.5 kg)   SpO2 100%   BMI 22.72 kg/m    This concludes the interaction.  Ruel Favors, LPN

## 2023-07-04 ENCOUNTER — Other Ambulatory Visit: Payer: Self-pay

## 2023-07-04 ENCOUNTER — Inpatient Hospital Stay: Payer: Medicare Other | Admitting: Dietician

## 2023-07-04 ENCOUNTER — Ambulatory Visit: Payer: Medicare Other

## 2023-07-04 ENCOUNTER — Inpatient Hospital Stay: Payer: Medicare Other

## 2023-07-04 ENCOUNTER — Ambulatory Visit
Admission: RE | Admit: 2023-07-04 | Discharge: 2023-07-04 | Disposition: A | Discharge status: Home / Self Care | Payer: Medicare Other | Source: Ambulatory Visit | Attending: Radiation Oncology | Admitting: Radiation Oncology

## 2023-07-04 ENCOUNTER — Inpatient Hospital Stay (HOSPITAL_BASED_OUTPATIENT_CLINIC_OR_DEPARTMENT_OTHER): Payer: Medicare Other | Admitting: Adult Health

## 2023-07-04 VITALS — BP 126/62 | HR 66 | Resp 18

## 2023-07-04 DIAGNOSIS — C090 Malignant neoplasm of tonsillar fossa: Secondary | ICD-10-CM

## 2023-07-04 DIAGNOSIS — M25542 Pain in joints of left hand: Secondary | ICD-10-CM

## 2023-07-04 DIAGNOSIS — N3090 Cystitis, unspecified without hematuria: Secondary | ICD-10-CM

## 2023-07-04 DIAGNOSIS — N201 Calculus of ureter: Secondary | ICD-10-CM

## 2023-07-04 DIAGNOSIS — Z510 Encounter for antineoplastic radiation therapy: Secondary | ICD-10-CM | POA: Diagnosis not present

## 2023-07-04 DIAGNOSIS — M25541 Pain in joints of right hand: Secondary | ICD-10-CM

## 2023-07-04 DIAGNOSIS — R634 Abnormal weight loss: Secondary | ICD-10-CM

## 2023-07-04 DIAGNOSIS — N410 Acute prostatitis: Secondary | ICD-10-CM | POA: Diagnosis not present

## 2023-07-04 DIAGNOSIS — Z95828 Presence of other vascular implants and grafts: Secondary | ICD-10-CM

## 2023-07-04 DIAGNOSIS — E860 Dehydration: Secondary | ICD-10-CM

## 2023-07-04 DIAGNOSIS — F17210 Nicotine dependence, cigarettes, uncomplicated: Secondary | ICD-10-CM

## 2023-07-04 LAB — URINALYSIS, COMPLETE (UACMP) WITH MICROSCOPIC
Bilirubin Urine: NEGATIVE
Glucose, UA: NEGATIVE mg/dL
Hgb urine dipstick: NEGATIVE
Ketones, ur: NEGATIVE mg/dL
Leukocytes,Ua: NEGATIVE
Nitrite: NEGATIVE
Protein, ur: NEGATIVE mg/dL
Specific Gravity, Urine: 1.011 (ref 1.005–1.030)
pH: 7 (ref 5.0–8.0)

## 2023-07-04 LAB — BASIC METABOLIC PANEL - CANCER CENTER ONLY
Anion gap: 5 (ref 5–15)
BUN: 28 mg/dL — ABNORMAL HIGH (ref 8–23)
CO2: 27 mmol/L (ref 22–32)
Calcium: 8.7 mg/dL — ABNORMAL LOW (ref 8.9–10.3)
Chloride: 101 mmol/L (ref 98–111)
Creatinine: 1.27 mg/dL — ABNORMAL HIGH (ref 0.61–1.24)
GFR, Estimated: >60 mL/min (ref >60)
Glucose, Bld: 111 mg/dL — ABNORMAL HIGH (ref 70–99)
Potassium: 4.3 mmol/L (ref 3.5–5.1)
Sodium: 133 mmol/L — ABNORMAL LOW (ref 135–145)

## 2023-07-04 LAB — CBC WITH DIFFERENTIAL (CANCER CENTER ONLY)
Abs Immature Granulocytes: 0.04 10*3/uL (ref 0.00–0.07)
Basophils Absolute: 0 10*3/uL (ref 0.0–0.1)
Basophils Relative: 0 %
Eosinophils Absolute: 0 10*3/uL (ref 0.0–0.5)
Eosinophils Relative: 1 %
HCT: 32.8 % — ABNORMAL LOW (ref 39.0–52.0)
Hemoglobin: 11.6 g/dL — ABNORMAL LOW (ref 13.0–17.0)
Immature Granulocytes: 2 %
Lymphocytes Relative: 10 %
Lymphs Abs: 0.3 10*3/uL — ABNORMAL LOW (ref 0.7–4.0)
MCH: 34.3 pg — ABNORMAL HIGH (ref 26.0–34.0)
MCHC: 35.4 g/dL (ref 30.0–36.0)
MCV: 97 fL (ref 80.0–100.0)
Monocytes Absolute: 0.2 10*3/uL (ref 0.1–1.0)
Monocytes Relative: 9 %
Neutro Abs: 2.1 10*3/uL (ref 1.7–7.7)
Neutrophils Relative %: 78 %
Platelet Count: 100 10*3/uL — ABNORMAL LOW (ref 150–400)
RBC: 3.38 MIL/uL — ABNORMAL LOW (ref 4.22–5.81)
RDW: 13.8 % (ref 11.5–15.5)
WBC Count: 2.6 10*3/uL — ABNORMAL LOW (ref 4.0–10.5)
nRBC: 0 % (ref 0.0–0.2)

## 2023-07-04 LAB — RAD ONC ARIA SESSION SUMMARY
Course Elapsed Days: 42
Plan Fractions Treated to Date: 29
Plan Prescribed Dose Per Fraction: 2 Gy
Plan Total Fractions Prescribed: 30
Plan Total Prescribed Dose: 60 Gy
Reference Point Dosage Given to Date: 58 Gy
Reference Point Session Dosage Given: 2 Gy
Session Number: 29

## 2023-07-04 LAB — MAGNESIUM: Magnesium: 1.9 mg/dL (ref 1.7–2.4)

## 2023-07-04 MED ORDER — SODIUM CHLORIDE 0.9% FLUSH
10.0000 mL | Freq: Once | INTRAVENOUS | Status: AC
  Administered 2023-07-04: 10 mL

## 2023-07-04 MED ORDER — HEPARIN SOD (PORK) LOCK FLUSH 100 UNIT/ML IV SOLN
500.0000 [IU] | Freq: Once | INTRAVENOUS | Status: AC
  Administered 2023-07-04: 500 [IU]

## 2023-07-04 MED ORDER — SODIUM CHLORIDE 0.9 % IV SOLN: INTRAVENOUS | Status: AC

## 2023-07-04 NOTE — Patient Instructions (Signed)
Dehydration, Adult Dehydration is a condition in which there is not enough water or other fluids in the body. This happens when a person loses more fluids than they take in. Important organs cannot work right without the right amount of fluids. Any loss of fluids from the body can cause dehydration. Dehydration can be mild, worse, or very bad. It should be treated right away to keep it from getting very bad. What are the causes? Conditions that cause loss of water in the body. They include: Watery poop (diarrhea). Vomiting. Sweating a lot. Fever. Infection. Peeing (urinating) a lot. Not drinking enough fluids. Certain medicines, such as medicines that take extra fluid out of the body (diuretics). Lack of safe drinking water. Not being able to get enough water and food. What increases the risk? Having a long-term (chronic) illness that has not been treated the right way, such as: Diabetes. Heart disease. Kidney disease. Being 65 years of age or older. Having a disability. Living in a place that is high above the ground or sea (high in altitude). The thinner, drier air causes more fluid loss. Doing exercises that put stress on your body for a long time. Being active when in hot places. What are the signs or symptoms? Symptoms of dehydration depend on how bad it is. Mild or worse dehydration Thirst. Dry lips or dry mouth. Feeling dizzy or light-headed. Muscle cramps. Passing little pee or dark pee. Pee may be the color of tea. Headache. Very bad dehydration Changes in skin. Skin may: Be cold to the touch (clammy). Be blotchy or pale. Not go back to normal right after you pinch it and let it go. Little or no tears, pee, or sweat. Fast breathing. Low blood pressure. Weak pulse. Pulse that is more than 100 beats a minute when you are sitting still. Other changes, such as: Feeling very thirsty. Eyes that look hollow (sunken). Cold hands and feet. Being confused. Being very  tired (lethargic) or having trouble waking from sleep. Losing weight. Loss of consciousness. How is this treated? Treatment for this condition depends on how bad your dehydration is. Treatment should start right away. Do not wait until your condition gets very bad. Very bad dehydration is an emergency. You will need to go to a hospital. Mild or worse dehydration can be treated at home. You may be asked to: Drink more fluids. Drink an oral rehydration solution (ORS). This drink gives you the right amount of fluids, salts, and minerals (electrolytes). Very bad dehydration can be treated: With fluids through an IV tube. By correcting low levels of electrolytes in the body. By treating the problem that caused your dehydration. Follow these instructions at home: Oral rehydration solution If told by your doctor, drink an ORS: Make an ORS. Use instructions on the package. Start by drinking small amounts, about  cup (120 mL) every 5-10 minutes. Slowly drink more until you have had the amount that your doctor said to have.  Eating and drinking  Drink enough clear fluid to keep your pee pale yellow. If you were told to drink an ORS, finish the ORS first. Then, start slowly drinking other clear fluids. Drink fluids such as: Water. Do not drink only water. Doing that can make the salt (sodium) level in your body get too low. Water from ice chips you suck on. Fruit juice that you have added water to (diluted). Low-calorie sports drinks. Eat foods that have the right amounts of salts and minerals, such as bananas, oranges, potatoes,   tomatoes, or spinach. Do not drink alcohol. Avoid drinks that have caffeine or sugar. These include:: High-calorie sports drinks. Fruit juice that you did not add water to. Soda. Coffee or energy drinks. Avoid foods that are greasy or have a lot of fat or sugar. General instructions Take over-the-counter and prescription medicines only as told by your doctor. Do  not take sodium tablets. Doing that can make the salt level in your body get too high. Return to your normal activities as told by your doctor. Ask your doctor what activities are safe for you. Keep all follow-up visits. Your doctor may check and change your treatment. Contact a doctor if: You have pain in your belly (abdomen) and the pain: Gets worse. Stays in one place. You have a rash. You have a stiff neck. You get angry or annoyed more easily than normal. You are more tired or have a harder time waking than normal. You feel weak or dizzy. You feel very thirsty. Get help right away if: You have any symptoms of very bad dehydration. You vomit every time you eat or drink. Your vomiting gets worse, does not go away, or you vomit blood or green stuff. You are getting treatment, but symptoms are getting worse. You have a fever. You have a very bad headache. You have: Diarrhea that gets worse or does not go away. Blood in your poop (stool). This may cause poop to look black and tarry. No pee in 6-8 hours. Only a small amount of pee in 6-8 hours, and the pee is very dark. You have trouble breathing. These symptoms may be an emergency. Get help right away. Call 911. Do not wait to see if the symptoms will go away. Do not drive yourself to the hospital. This information is not intended to replace advice given to you by your health care provider. Make sure you discuss any questions you have with your health care provider. Document Revised: 07/10/2022 Document Reviewed: 07/10/2022 Elsevier Patient Education  2024 Elsevier Inc.  

## 2023-07-04 NOTE — Progress Notes (Signed)
Nutrition Follow-up:  Patient with SCC of right tonsil, p16 positive s/p TORS resection under the care of Dr. Hezzie Bump 04/06/23. He is receiving concurrent chemoradiation with weekly cisplatin (completed 6 cycles cisplatin 7/2) Final radiation planned 7/11.   S/p PEG 6/3 DME: [employer]   Met with patient and wife in infusion. He is receiving IV fluids today secondary to hypotension. He has not been eating orally in the last few days. Patient unable to taste and throat is sore. Previously eating grits, but no longer able to tolerate texture. Patient reports he would really like 3 hotdogs all the way from [employer]. Wife reports patient is tolerating one carton Osmolite 1.5 x 5/day. She recalls ~50 ounces of free water with flushes (~2384 ml water/day from formula + flushes) He is also drinking sips of water by mouth often through out the day. This helps with dry mouth.    Medications: reviewed   Labs: Na 133, BUN 28, Cr 1.27  Anthropometrics: Wt 180 lb 1.6 oz today - stable x 3 weeks  7/2 - 180 lb 6.4 oz  6/24 - 180 lb 6/18 - 185 lb 9.6 oz 6/11 - 188 lb 9.6 oz 6/4 - 194 lb 4.8 oz  5/28 - 196 lb 3.2 oz     Estimated Energy Needs  Kcals: 2300-2600 Protein: 107-130 g Fluid: >/=  2.3 L  NUTRITION DIAGNOSIS: Predicted suboptimal intake continues - addressing with TF   INTERVENTION:  Continue increasing tube feedings to goal as tolerated  Recommend continuing daily fluids x one week followed by fluids 3x/week x 2 weeks  Recommend weekly labs (CBC/CMP) x3 weeks Discussed with NP Lillard Anes)  MONITORING, EVALUATION, GOAL: weight trends, intake   NEXT VISIT: Thursday July 11

## 2023-07-05 ENCOUNTER — Inpatient Hospital Stay: Payer: Medicare Other

## 2023-07-05 ENCOUNTER — Other Ambulatory Visit: Payer: Self-pay

## 2023-07-05 ENCOUNTER — Ambulatory Visit
Admission: RE | Admit: 2023-07-05 | Discharge: 2023-07-05 | Disposition: A | Discharge status: Home / Self Care | Payer: Medicare Other | Source: Ambulatory Visit | Attending: Radiation Oncology | Admitting: Radiation Oncology

## 2023-07-05 ENCOUNTER — Encounter: Payer: Self-pay | Admitting: Hematology and Oncology

## 2023-07-05 ENCOUNTER — Inpatient Hospital Stay: Payer: Medicare Other | Admitting: Dietician

## 2023-07-05 VITALS — BP 116/60 | HR 66 | Temp 97.8°F | Resp 16

## 2023-07-05 DIAGNOSIS — R634 Abnormal weight loss: Secondary | ICD-10-CM

## 2023-07-05 DIAGNOSIS — C090 Malignant neoplasm of tonsillar fossa: Secondary | ICD-10-CM

## 2023-07-05 DIAGNOSIS — Z95828 Presence of other vascular implants and grafts: Secondary | ICD-10-CM

## 2023-07-05 DIAGNOSIS — N201 Calculus of ureter: Secondary | ICD-10-CM

## 2023-07-05 DIAGNOSIS — Z510 Encounter for antineoplastic radiation therapy: Secondary | ICD-10-CM | POA: Diagnosis not present

## 2023-07-05 DIAGNOSIS — M25541 Pain in joints of right hand: Secondary | ICD-10-CM

## 2023-07-05 LAB — RAD ONC ARIA SESSION SUMMARY
Course Elapsed Days: 43
Plan Fractions Treated to Date: 30
Plan Prescribed Dose Per Fraction: 2 Gy
Plan Total Fractions Prescribed: 30
Plan Total Prescribed Dose: 60 Gy
Reference Point Dosage Given to Date: 60 Gy
Reference Point Session Dosage Given: 2 Gy
Session Number: 30

## 2023-07-05 MED ORDER — SODIUM CHLORIDE 0.9 % IV SOLN: INTRAVENOUS | Status: AC

## 2023-07-05 MED ORDER — HEPARIN SOD (PORK) LOCK FLUSH 100 UNIT/ML IV SOLN
500.0000 [IU] | Freq: Once | INTRAVENOUS | Status: AC
  Administered 2023-07-05: 500 [IU]

## 2023-07-05 MED ORDER — SODIUM CHLORIDE 0.9% FLUSH
10.0000 mL | Freq: Once | INTRAVENOUS | Status: AC
  Administered 2023-07-05: 10 mL

## 2023-07-05 NOTE — Progress Notes (Signed)
Oncology Nurse Navigator Documentation   Met with Ethan [name] and Ethan [name] after final RT to offer support and to celebrate end of radiation treatment.   Provided verbal post-RT guidance: Importance of keeping all follow-up appts, especially those with Nutrition and SLP. Importance of protecting treatment area from sun. Continuation of Sonafine application 2-3 times daily, application of antibiotic ointment to areas of raw skin; when supply of Sonafine exhausted transition to OTC lotion with vitamin E.  Explained my role as navigator will continue for several more months, encouraged him to call me with needs/concerns.    Hedda Slade RN, BSN, OCN Head & Neck Oncology Nurse Navigator [facility] Phone # [phone]  Fax # [phone]

## 2023-07-05 NOTE — Progress Notes (Signed)
Met with patient and family in the healing garden to celebrate completion of treatment.   RD offered support and encouragement. He will continue receiving supportive therapy with IV fluids.   Will plan to follow-up with patient next week in [facility].

## 2023-07-05 NOTE — Assessment & Plan Note (Signed)
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  He is here today for evaluation prior to receiving Cisplatin.  Stage II squamous cell carcinoma of the tonsillar fossa: He has received 6 cycles of weekly cisplatin.  Due to his declining functional status and ECOG we will hold his final cycle of chemotherapy. Dehydration: He has significant orthostatic hypotension related to this.  He will receive IV fluids today and daily for the next 2 weeks.  I will see him next week for labs and follow-up to reassess his orthostatic vital signs and to evaluate if we can decrease the frequency of IV fluids. Prostatitis: His urinalysis today was negative.  He has no complaints of urinary frequency or pain issues.  There is still room for him to increase his Flomax to 0.8 mg but I would wait to do this until his blood pressure improves. Weight loss: He will continue to see nutrition during his chemotherapy.    Ethan [name] will return daily until he completes his radiation.  He will also return daily for IV fluids.  He will return to our clinic next week for labs and follow-up.

## 2023-07-05 NOTE — Progress Notes (Signed)
[facility] Cancer Follow up:    Practice, [facility] [address] [place] [address]   DIAGNOSIS:  Cancer Staging  Cancer of tonsillar fossa [facility]) Staging form: Pharynx - HPV-Mediated Oropharynx, AJCC 8th Edition - Pathologic stage from 04/27/2023: Stage II (pT2, pN2, cM0, p16+) - Signed by Lonie Peak, MD on 04/27/2023 Stage prefix: Initial diagnosis   SUMMARY OF ONCOLOGIC HISTORY: Oncology History  Cancer of tonsillar fossa (HCC)  02/07/2023 Initial Biopsy   Biopsy of the right tonsil  revealed: squamous cell carcinoma, p16 positive.    03/12/2023 PET scan   Right tonsillar mass as tracer avid and concerning for malignancy; hypermetabolic right level 2 and level 3 cervical lymph nodes compatible with metastatic adenopathy; indeterminate tracer avid right upper lobe elongated area of increased soft tissue thickening within the medial right upper lobe (SUV max of 5.08); and nonspecific mild tracer avid left hilar lymph nodes.    03/20/2023 Miscellaneous   Dentistry evaluation: Extractions of #18 and #19 with Dr. Azucena Cecil   04/06/2023 Surgery   TORS resection (Dr. Hezzie Bump): 2.7 cm tumor, histology of nonkeratinizing squamous cell carcinoma with focal maturation, HPV associated; + LVI and PNI; all margins negative.  8/12 + LN including right level 2A, 3, and 4 being +.  Extranodal extension present, largest tumor deposit 2.6cm.     04/27/2023 Cancer Staging   Staging form: Pharynx - HPV-Mediated Oropharynx, AJCC 8th Edition - Pathologic stage from 04/27/2023: Stage II (pT2, pN2, cM0, p16+) - Signed by Lonie Peak, MD on 04/27/2023 Stage prefix: Initial diagnosis   05/22/2023 - 06/26/2023 Chemotherapy   Patient is on Treatment Plan : HEAD/NECK Cisplatin (40) q7d     05/23/2023 - 07/04/2023 Radiation Therapy   Concurrent chemoradiation     CURRENT THERAPY: chemoradiation  INTERVAL HISTORY: Ethan [name] 72 y.o. male returns for follow-up prior to  receiving cisplatin today.  He tells me that he is hoping not to receive treatment today.  It would be his seventh cycle of cisplatin.  He is having increased difficulty with getting his water in.  He also notes a shaky feeling and feels lightheaded particularly with position changes.  He has throat pain and is taking Vicodin for this which is helping.   Patient Active Problem List   Diagnosis Date Noted   Port-A-Cath in place 05/29/2023   Cancer of tonsillar fossa (HCC) 03/07/2023   Arthralgia 08/29/2018   Weight loss 08/29/2018   Ureteral calculus, right 03/11/2015   Ureteral calculus 03/11/2015   Aftercare following surgery of the circulatory system, NEC 01/12/2014   Abdominal aneurysm without mention of rupture 07/08/2012    has No Known Allergies.  MEDICAL HISTORY: Past Medical History:  Diagnosis Date   AAA (abdominal aortic aneurysm) (HCC)    Hyperlipidemia    Kidney stones    Renal disorder     SURGICAL HISTORY: Past Surgical History:  Procedure Laterality Date   ABDOMINAL AORTIC ANEURYSM REPAIR  06/15/2011   EVAR   CYSTOSCOPY WITH RETROGRADE PYELOGRAM, URETEROSCOPY AND STENT PLACEMENT Right 03/11/2015   Procedure: CYSTOSCOPY WITH RETROGRADE PYELOGRAM, AND RIGHT URETERAL STENT PLACEMENT;  Surgeon: Barron Alvine, MD;  Location: [facility];  Service: Urology;  Laterality: Right;   HERNIA REPAIR     IR GASTROSTOMY TUBE MOD SED  05/28/2023   IR IMAGING GUIDED PORT INSERTION  05/28/2023   IR NASO G TUBE PLC W/FL-NO RAD  05/28/2023   LOWER BACK FUSION, L2-L3,AND L3-L4     RIGHT  LOWER TIB-FEM FRACTURE     SHOULDER SURGERY Bilateral    SPINE SURGERY     THROAT SURGERY  04/06/2023    SOCIAL HISTORY: Social History   Socioeconomic History   Marital status: Married    Spouse name: Not on file   Number of children: Not on file   Years of education: Not on file   Highest education level: Not on file  Occupational History   Not on file  Tobacco Use   Smoking status: Some Days     Current packs/day: 0.50    Types: Cigarettes   Smokeless tobacco: Never  Vaping Use   Vaping status: Never Used  Substance and Sexual Activity   Alcohol use: Yes    Alcohol/week: 0.0 standard drinks of alcohol    Comment: occ   Drug use: No   Sexual activity: Not Currently  Other Topics Concern   Not on file  Social History Narrative   Not on file   Social Determinants of Health   Financial Resource Strain: Not on file  Food Insecurity: No Food Insecurity (04/26/2023)   Hunger Vital Sign    Worried About Running Out of Food in the Last Year: Never true    Ran Out of Food in the Last Year: Never true  Transportation Needs: No Transportation Needs (04/26/2023)   PRAPARE - Administrator, Civil Service (Medical): No    Lack of Transportation (Non-Medical): No  Physical Activity: Not on file  Stress: Not on file  Social Connections: Not on file  Intimate Partner Violence: Not At Risk (04/26/2023)   Humiliation, Afraid, Rape, and Kick questionnaire    Fear of Current or Ex-Partner: No    Emotionally Abused: No    Physically Abused: No    Sexually Abused: No    FAMILY HISTORY: Family History  Problem Relation Age of Onset   Aneurysm Father    Stroke Mother    Heart disease Mother        Aneurysm   Hypertension Sister    Alcohol abuse Brother    Cancer Brother        mouth cancer   Aneurysm Paternal Uncle     Review of Systems  Constitutional:  Positive for appetite change and fatigue. Negative for chills, fever and unexpected weight change.  HENT:   Positive for sore throat and trouble swallowing. Negative for hearing loss and lump/mass.   Eyes:  Negative for eye problems and icterus.  Respiratory:  Negative for chest tightness, cough and shortness of breath.   Cardiovascular:  Negative for chest pain, leg swelling and palpitations.  Gastrointestinal:  Negative for abdominal distention, abdominal pain, constipation, diarrhea, nausea and vomiting.   Endocrine: Negative for hot flashes.  Genitourinary:  Negative for difficulty urinating.   Musculoskeletal:  Negative for arthralgias.  Skin:  Negative for itching and rash.  Neurological:  Negative for dizziness, extremity weakness, headaches and numbness.  Hematological:  Negative for adenopathy. Does not bruise/bleed easily.  Psychiatric/Behavioral:  Negative for depression. The patient is not nervous/anxious.       PHYSICAL EXAMINATION    Vitals:   07/04/23 0949  BP: 103/63  Pulse: 77  Resp: 18  Temp: 97.7 F (36.5 C)  SpO2: 98%    Physical Exam Constitutional:      General: He is not in acute distress.    Appearance: Normal appearance. He is not toxic-appearing.  HENT:     Head: Normocephalic and atraumatic.  Mouth/Throat:     Mouth: Mucous membranes are moist.     Pharynx: Oropharyngeal exudate and posterior oropharyngeal erythema present.  Eyes:     General: No scleral icterus. Cardiovascular:     Rate and Rhythm: Regular rhythm. Tachycardia present.     Pulses: Normal pulses.     Heart sounds: Normal heart sounds.  Pulmonary:     Effort: Pulmonary effort is normal.     Breath sounds: Normal breath sounds.  Abdominal:     General: Abdomen is flat. Bowel sounds are normal. There is no distension.     Palpations: Abdomen is soft.     Tenderness: There is no abdominal tenderness.  Musculoskeletal:        General: No swelling.     Cervical back: Neck supple.  Lymphadenopathy:     Cervical: No cervical adenopathy.  Skin:    General: Skin is warm and dry.     Findings: No rash.  Neurological:     General: No focal deficit present.     Mental Status: He is alert.  Psychiatric:        Mood and Affect: Mood normal.        Behavior: Behavior normal.     LABORATORY DATA:  CBC    Component Value Date/Time   WBC 2.6 (L) 07/04/2023 0849   WBC 4.8 06/16/2023 1624   RBC 3.38 (L) 07/04/2023 0849   HGB 11.6 (L) 07/04/2023 0849   HCT 32.8 (L)  07/04/2023 0849   PLT 100 (L) 07/04/2023 0849   MCV 97.0 07/04/2023 0849   MCH 34.3 (H) 07/04/2023 0849   MCHC 35.4 07/04/2023 0849   RDW 13.8 07/04/2023 0849   LYMPHSABS 0.3 (L) 07/04/2023 0849   MONOABS 0.2 07/04/2023 0849   EOSABS 0.0 07/04/2023 0849   BASOSABS 0.0 07/04/2023 0849    CMP     Component Value Date/Time   NA 133 (L) 07/04/2023 0849   K 4.3 07/04/2023 0849   CL 101 07/04/2023 0849   CO2 27 07/04/2023 0849   GLUCOSE 111 (H) 07/04/2023 0849   BUN 28 (H) 07/04/2023 0849   CREATININE 1.27 (H) 07/04/2023 0849   CALCIUM 8.7 (L) 07/04/2023 0849   PROT 7.2 03/06/2023 1255   ALBUMIN 4.3 03/06/2023 1255   AST 13 (L) 03/06/2023 1255   ALT 8 03/06/2023 1255   ALKPHOS 63 03/06/2023 1255   BILITOT 0.5 03/06/2023 1255   GFRNONAA >60 07/04/2023 0849   GFRAA 49 (L) 03/12/2015 0505      ASSESSMENT and THERAPY PLAN:   Cancer of tonsillar fossa (HCC) Ethan [name] is a 72 year old man with 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  He is here today for evaluation prior to receiving Cisplatin.  Stage II squamous cell carcinoma of the tonsillar fossa: He has received 6 cycles of weekly cisplatin.  Due to his declining functional status and ECOG we will hold his final cycle of chemotherapy. Dehydration: He has significant orthostatic hypotension related to this.  He will receive IV fluids today and daily for the next 2 weeks.  I will see him next week for labs and follow-up to reassess his orthostatic vital signs and to evaluate if we can decrease the frequency of IV fluids. Prostatitis: His urinalysis today was negative.  He has no complaints of urinary frequency or pain issues.  There is still room for him to increase his Flomax to 0.8 mg but I would wait to do this until his blood  pressure improves. Weight loss: He will continue to see nutrition during his chemotherapy.    Ethan [name] will return daily until he completes his radiation.  He will also return  daily for IV fluids.  He will return to our clinic next week for labs and follow-up.    All questions were answered. The patient knows to call the clinic with any problems, questions or concerns. We can certainly see the patient much sooner if necessary.  Total encounter time:60 minutes*in face-to-face visit time, chart review, lab review, care coordination, order entry, and documentation of the encounter time.  Lillard Anes, NP 07/05/23 4:55 PM Medical Oncology and Hematology [facility] [address] [place], [place] [address] Tel. [phone]    Fax. [phone]  *Total Encounter Time as defined by the Centers for Medicare and Medicaid Services includes, in addition to the face-to-face time of a patient visit (documented in the note above) non-face-to-face time: obtaining and reviewing outside history, ordering and reviewing medications, tests or procedures, care coordination (communications with other health care professionals or caregivers) and documentation in the medical record.

## 2023-07-05 NOTE — Patient Instructions (Signed)

## 2023-07-06 ENCOUNTER — Other Ambulatory Visit: Payer: Self-pay

## 2023-07-06 ENCOUNTER — Inpatient Hospital Stay: Payer: Medicare Other

## 2023-07-06 ENCOUNTER — Telehealth: Payer: Self-pay | Admitting: Hematology and Oncology

## 2023-07-06 VITALS — BP 113/58 | HR 64 | Resp 16

## 2023-07-06 DIAGNOSIS — C090 Malignant neoplasm of tonsillar fossa: Secondary | ICD-10-CM

## 2023-07-06 DIAGNOSIS — R634 Abnormal weight loss: Secondary | ICD-10-CM

## 2023-07-06 DIAGNOSIS — Z95828 Presence of other vascular implants and grafts: Secondary | ICD-10-CM

## 2023-07-06 DIAGNOSIS — N201 Calculus of ureter: Secondary | ICD-10-CM

## 2023-07-06 DIAGNOSIS — R911 Solitary pulmonary nodule: Secondary | ICD-10-CM

## 2023-07-06 DIAGNOSIS — M25541 Pain in joints of right hand: Secondary | ICD-10-CM

## 2023-07-06 DIAGNOSIS — Z510 Encounter for antineoplastic radiation therapy: Secondary | ICD-10-CM | POA: Diagnosis not present

## 2023-07-06 MED ORDER — SODIUM CHLORIDE 0.9 % IV SOLN: INTRAVENOUS | Status: AC

## 2023-07-06 MED ORDER — HEPARIN SOD (PORK) LOCK FLUSH 100 UNIT/ML IV SOLN
500.0000 [IU] | Freq: Once | INTRAVENOUS | Status: AC
  Administered 2023-07-06: 500 [IU] via INTRAVENOUS

## 2023-07-06 MED ORDER — SODIUM CHLORIDE 0.9% FLUSH
10.0000 mL | Freq: Once | INTRAVENOUS | Status: AC
  Administered 2023-07-06: 10 mL via INTRAVENOUS

## 2023-07-06 NOTE — Patient Instructions (Signed)

## 2023-07-06 NOTE — Radiation Completion Notes (Signed)
Patient Name: Ethan [name], Ethan [name] MRN: [id number] Date of Birth: 06/12/1951 Referring Physician: Corey Skains, M.D. Date of Service: 2023-07-06 Radiation Oncologist: Lonie Peak, M.D. [facility]                             RADIATION ONCOLOGY END OF TREATMENT NOTE     Diagnosis: C09.0 Malignant neoplasm of tonsillar fossa Staging on 2023-04-27: Cancer of tonsillar fossa (HCC) T=pT2, N=pN2, M=cM0 Intent: Curative     ==========DELIVERED PLANS==========  First Treatment Date: 2023-05-23 - Last Treatment Date: 2023-07-05   Plan Name: [id number]_R_Tonsil Site: Tonsil, Right Technique: IMRT Mode: Photon Dose Per Fraction: 2 Gy Prescribed Dose (Delivered / Prescribed): 60 Gy / 60 Gy Prescribed Fxs (Delivered / Prescribed): 30 / 30     ==========ON TREATMENT VISIT DATES========== 2023-05-29, 2023-06-01, 2023-06-11, 2023-06-18, 2023-06-25, 2023-06-29     ==========UPCOMING VISITS==========       ==========APPENDIX - ON TREATMENT VISIT NOTES==========   See weekly On Treatment Notes in Epic for details.

## 2023-07-09 ENCOUNTER — Inpatient Hospital Stay: Payer: Medicare Other

## 2023-07-09 VITALS — BP 122/66 | HR 69 | Temp 97.8°F | Resp 16 | Wt 181.0 lb

## 2023-07-09 DIAGNOSIS — C090 Malignant neoplasm of tonsillar fossa: Secondary | ICD-10-CM

## 2023-07-09 DIAGNOSIS — M25541 Pain in joints of right hand: Secondary | ICD-10-CM

## 2023-07-09 DIAGNOSIS — Z510 Encounter for antineoplastic radiation therapy: Secondary | ICD-10-CM | POA: Diagnosis not present

## 2023-07-09 DIAGNOSIS — N201 Calculus of ureter: Secondary | ICD-10-CM

## 2023-07-09 DIAGNOSIS — N3090 Cystitis, unspecified without hematuria: Secondary | ICD-10-CM

## 2023-07-09 DIAGNOSIS — R634 Abnormal weight loss: Secondary | ICD-10-CM

## 2023-07-09 DIAGNOSIS — Z95828 Presence of other vascular implants and grafts: Secondary | ICD-10-CM

## 2023-07-09 LAB — BASIC METABOLIC PANEL - CANCER CENTER ONLY
Anion gap: 7 (ref 5–15)
BUN: 27 mg/dL — ABNORMAL HIGH (ref 8–23)
CO2: 25 mmol/L (ref 22–32)
Calcium: 8.9 mg/dL (ref 8.9–10.3)
Chloride: 101 mmol/L (ref 98–111)
Creatinine: 1.26 mg/dL — ABNORMAL HIGH (ref 0.61–1.24)
GFR, Estimated: >60 mL/min (ref >60)
Glucose, Bld: 188 mg/dL — ABNORMAL HIGH (ref 70–99)
Potassium: 4.1 mmol/L (ref 3.5–5.1)
Sodium: 133 mmol/L — ABNORMAL LOW (ref 135–145)

## 2023-07-09 LAB — CBC WITH DIFFERENTIAL (CANCER CENTER ONLY)
Abs Immature Granulocytes: 0.02 10*3/uL (ref 0.00–0.07)
Basophils Absolute: 0 10*3/uL (ref 0.0–0.1)
Basophils Relative: 1 %
Eosinophils Absolute: 0 10*3/uL (ref 0.0–0.5)
Eosinophils Relative: 0 %
HCT: 32.5 % — ABNORMAL LOW (ref 39.0–52.0)
Hemoglobin: 11.6 g/dL — ABNORMAL LOW (ref 13.0–17.0)
Immature Granulocytes: 1 %
Lymphocytes Relative: 5 %
Lymphs Abs: 0.2 10*3/uL — ABNORMAL LOW (ref 0.7–4.0)
MCH: 34.8 pg — ABNORMAL HIGH (ref 26.0–34.0)
MCHC: 35.7 g/dL (ref 30.0–36.0)
MCV: 97.6 fL (ref 80.0–100.0)
Monocytes Absolute: 0.2 10*3/uL (ref 0.1–1.0)
Monocytes Relative: 6 %
Neutro Abs: 2.7 10*3/uL (ref 1.7–7.7)
Neutrophils Relative %: 87 %
Platelet Count: 157 10*3/uL (ref 150–400)
RBC: 3.33 MIL/uL — ABNORMAL LOW (ref 4.22–5.81)
RDW: 14.9 % (ref 11.5–15.5)
WBC Count: 3.1 10*3/uL — ABNORMAL LOW (ref 4.0–10.5)
nRBC: 0 % (ref 0.0–0.2)

## 2023-07-09 LAB — MAGNESIUM: Magnesium: 1.8 mg/dL (ref 1.7–2.4)

## 2023-07-09 MED ORDER — ONDANSETRON HCL 4 MG/2ML IJ SOLN
4.0000 mg | Freq: Once | INTRAMUSCULAR | Status: DC
  Filled 2023-07-09: qty 2

## 2023-07-09 MED ORDER — SODIUM CHLORIDE 0.9 % IV SOLN: INTRAVENOUS | Status: AC

## 2023-07-09 MED ORDER — SODIUM CHLORIDE 0.9% FLUSH
10.0000 mL | Freq: Once | INTRAVENOUS | Status: AC
  Administered 2023-07-09: 10 mL

## 2023-07-09 MED ORDER — HEPARIN SOD (PORK) LOCK FLUSH 100 UNIT/ML IV SOLN
500.0000 [IU] | Freq: Once | INTRAVENOUS | Status: AC
  Administered 2023-07-09: 500 [IU]

## 2023-07-09 NOTE — Patient Instructions (Signed)

## 2023-07-10 ENCOUNTER — Inpatient Hospital Stay: Payer: Medicare Other

## 2023-07-10 ENCOUNTER — Other Ambulatory Visit: Payer: Self-pay

## 2023-07-10 ENCOUNTER — Inpatient Hospital Stay: Payer: Medicare Other | Admitting: Dietician

## 2023-07-10 VITALS — BP 111/60 | HR 63 | Resp 17

## 2023-07-10 DIAGNOSIS — Z510 Encounter for antineoplastic radiation therapy: Secondary | ICD-10-CM | POA: Diagnosis not present

## 2023-07-10 DIAGNOSIS — N201 Calculus of ureter: Secondary | ICD-10-CM

## 2023-07-10 DIAGNOSIS — Z95828 Presence of other vascular implants and grafts: Secondary | ICD-10-CM

## 2023-07-10 DIAGNOSIS — R634 Abnormal weight loss: Secondary | ICD-10-CM

## 2023-07-10 DIAGNOSIS — C090 Malignant neoplasm of tonsillar fossa: Secondary | ICD-10-CM

## 2023-07-10 DIAGNOSIS — M25541 Pain in joints of right hand: Secondary | ICD-10-CM

## 2023-07-10 MED ORDER — SODIUM CHLORIDE 0.9 % IV SOLN: INTRAVENOUS | Status: AC

## 2023-07-10 NOTE — Progress Notes (Signed)
Nutrition Follow-up:  Patient has completed concurrent chemoradiation therapy for SCC of right tonsil. Final radiation 7/11. Patient is currently receiving supportive therapy with IV hydration.   Met with patient and wife in infusion. He reports worsening sore throat since completing treatment. Patient states pain is comparative to what he experienced after TORS. He is taking pain medication as needed. Patient is not eating orally. He is taking sips of water. Patient has thick saliva. He is tolerating 6 cartons Osmolite 1.5 via tube. Patient denies nausea, vomiting, diarrhea, constipation.   Medications: reviewed   Labs: 7/15 - Na 133, BUN 27, Cr 1.26  Anthropometrics: Wt 181 lb on 7/15 - stable  7/10 - 180 lb 1.6 oz  7/2 - 180 lb 6.4 oz  6/24 - 180 lb 6/18 - 185 lb 9.6 oz 6/11 - 188 lb 9.6 oz 6/4 - 194 lb 4.8 oz  5/28 - 196 lb 3.2 oz   Estimated Energy Needs   Kcals: 2300-2600 Protein: 107-130 g Fluid: >/=  2.3 L   NUTRITION DIAGNOSIS: Predicted suboptimal intake continues - addressing with TF   INTERVENTION:  Continue 6 cartons Osmolite 1.5 via tube  Encouraged pt to try drinking one Boost Plus orally as tolerated  Suggested trying ginger ale/sprite rinses for thick saliva  Pt continues to have episodes of orthostatic hypotension. Recommend continuing supportive therapy with daily IV fluids     MONITORING, EVALUATION, GOAL: weight trends, labs,    NEXT VISIT: Friday 7/19 in infusion

## 2023-07-10 NOTE — Patient Instructions (Signed)

## 2023-07-11 ENCOUNTER — Inpatient Hospital Stay: Payer: Medicare Other

## 2023-07-12 ENCOUNTER — Other Ambulatory Visit: Payer: Self-pay

## 2023-07-12 ENCOUNTER — Inpatient Hospital Stay: Payer: Medicare Other

## 2023-07-12 ENCOUNTER — Ambulatory Visit: Payer: Medicare Other | Attending: Radiation Oncology

## 2023-07-12 DIAGNOSIS — C090 Malignant neoplasm of tonsillar fossa: Secondary | ICD-10-CM | POA: Diagnosis present

## 2023-07-12 DIAGNOSIS — R293 Abnormal posture: Secondary | ICD-10-CM | POA: Insufficient documentation

## 2023-07-12 DIAGNOSIS — Z510 Encounter for antineoplastic radiation therapy: Secondary | ICD-10-CM | POA: Diagnosis not present

## 2023-07-12 DIAGNOSIS — R1310 Dysphagia, unspecified: Secondary | ICD-10-CM | POA: Diagnosis present

## 2023-07-12 DIAGNOSIS — N201 Calculus of ureter: Secondary | ICD-10-CM

## 2023-07-12 DIAGNOSIS — R634 Abnormal weight loss: Secondary | ICD-10-CM

## 2023-07-12 DIAGNOSIS — M25541 Pain in joints of right hand: Secondary | ICD-10-CM

## 2023-07-12 DIAGNOSIS — Z95828 Presence of other vascular implants and grafts: Secondary | ICD-10-CM

## 2023-07-12 MED ORDER — SODIUM CHLORIDE 0.9 % IV SOLN: INTRAVENOUS | Status: AC

## 2023-07-12 MED ORDER — HEPARIN SOD (PORK) LOCK FLUSH 100 UNIT/ML IV SOLN
500.0000 [IU] | Freq: Once | INTRAVENOUS | Status: AC
  Administered 2023-07-12: 500 [IU]

## 2023-07-12 MED ORDER — SODIUM CHLORIDE 0.9% FLUSH
10.0000 mL | Freq: Once | INTRAVENOUS | Status: AC
  Administered 2023-07-12: 10 mL

## 2023-07-12 NOTE — Patient Instructions (Signed)

## 2023-07-12 NOTE — Therapy (Signed)
OUTPATIENT SPEECH LANGUAGE PATHOLOGY ONCOLOGY EVALUATION   Patient Name: Ethan [name] MRN: [id number] DOB:[birth year], 72 y.o., male Today's Date: 07/12/2023  PCP: [facility] REFERRING PROVIDER: Lonie Peak, MD  END OF SESSION:  End of Session - 07/12/23 0835     Visit Number 2    Number of Visits 7    Date for SLP Re-Evaluation 08/29/23    Activity Tolerance Patient tolerated treatment well             Past Medical History:  Diagnosis Date   AAA (abdominal aortic aneurysm) (HCC)    Hyperlipidemia    Kidney stones    Renal disorder    Past Surgical History:  Procedure Laterality Date   ABDOMINAL AORTIC ANEURYSM REPAIR  06/15/2011   EVAR   CYSTOSCOPY WITH RETROGRADE PYELOGRAM, URETEROSCOPY AND STENT PLACEMENT Right 03/11/2015   Procedure: CYSTOSCOPY WITH RETROGRADE PYELOGRAM, AND RIGHT URETERAL STENT PLACEMENT;  Surgeon: Barron Alvine, MD;  Location: [facility];  Service: Urology;  Laterality: Right;   HERNIA REPAIR     IR GASTROSTOMY TUBE MOD SED  05/28/2023   IR IMAGING GUIDED PORT INSERTION  05/28/2023   IR NASO G TUBE PLC W/FL-NO RAD  05/28/2023   LOWER BACK FUSION, L2-L3,AND L3-L4     RIGHT LOWER TIB-FEM FRACTURE     SHOULDER SURGERY Bilateral    SPINE SURGERY     THROAT SURGERY  04/06/2023   Patient Active Problem List   Diagnosis Date Noted   Port-A-Cath in place 05/29/2023   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 Pt accompanied by: significant other  PERTINENT HISTORY:  SCC of the right tonsil, stage II. He presented to Dr. Jenne Pane on 02/07/23 for evaluation of an increasing right tonsillar mass that he had first noticed 1 month prior. Exam of the oropharynx revealed an enlarged and irregular right tonsil, 1 + left tonsil and a couple of enlarged right zone 2 lymph nodes. 02/07/23 Biopsy in office by Dr. Jenne Pane revealed SCC, p 16 +. Dr. Jenne Pane referred him to Dr. Hezzie Bump at [facility] to discuss surgical options. 02/27/23 Consult with Dr. Hezzie Bump and following discussion he had opted for surgery at that time but then expressed desire to learn about non surgical options. 03/06/23 Consult with Dr. Basilio Cairo. After this consult he then made the decision to proceed with surgery and his scheduled appointment with Dr. Al Pimple was cancelled.03/12/23 PET showed the right tonsillar mass as tracer avid and concerning for malignancy; hypermetabolic right level 2 and level 3 cervical lymph nodes compatible with metastatic adenopathy; indeterminate tracer avid right upper lobe elongated area of increased soft tissue thickening within the medial right upper lobe (SUV max of 5.08); and nonspecific mild tracer avid left hilar lymph nodes. 04/06/23 He underwent TORS, right tonsillectomy and right neck dissection with Dr. Hezzie Bump. 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 Nodal status of 08/12 right level 2A, 3, and 4 lymph nodes positive for carcinoma; extranodal extension present; largest tumor deposit of 2.6 cm. Submandibular contents were also excised and showed no evidence of malignancy. He also underwent pharyngoplasty repair of the soft palate defect at the time of his procedure. Unfortunately chemo/radiation were recommended  after surgery and he saw Dr. Basilio Cairo on 04/27/23 for reconsult and Dr. Al Pimple on 05/01/23 for consult. Chemotherapy/Radiation are planned as recommended. Treatment plan:  He will receive 30 fractions of radiation to his Oropharynx and bilateral neck along with weekly cisplatin which started on 05/24/23 and will complete 07/05/23.  PAIN:  Are you having pain? Yes: NPRS scale: 7/10 Pain location: throat Pain description: sore Aggravating factors: swallowing Relieving factors: "getting it oiled up"  FALLS: Has patient fallen in last 6 months?  No  PATIENT GOALS: Maintain WNL swallowing  OBJECTIVE:    TODAY'S TREATMENT:                                                                                                                                         DATE:  07/12/23: Pt doing Masako regularly. SLP encouraged one-two reps of each exercise instead of focus on just one. Pt demo'd understanding. He completed HEP with independence.  Prentis drank sips of water without any overt s/sx oral or pharyngeal difficulties. He drinks water "all day".  SLP educated pt and wife about food journal, and pt told SLP rationale for HEP.   05/31/23 (Eval): Research states the risk for dysphagia increases due to radiation and/or chemotherapy treatment due to a variety of factors, so SLP educated the pt about the possibility of reduced/limited ability for PO intake during rad tx. SLP also educated pt regarding possible changes to swallowing musculature after rad tx, and why adherence to dysphagia HEP provided today and PO consumption was necessary to inhibit muscle fibrosis following rad tx and to mitigate muscle disuse atrophy. SLP informed pt why this would be detrimental to their swallowing status and to their pulmonary health. Pt demonstrated understanding of these things to SLP. SLP encouraged pt to safely eat and drink as deep into their radiation/chemotherapy as possible to provide the best possible long-term swallowing  outcome for pt.  SLP then developed an individualized HEP for pt involving oral and pharyngeal strengthening and ROM and pt was instructed how to perform these exercises, including SLP demonstration. After SLP demonstration, pt return demonstrated each exercise. SLP ensured pt performance was correct prior to educating pt on next exercise. Pt required occasional min-mod cues faded to modified independent to perform HEP. Pt was instructed to complete this program 6-7 days/week, at least 2 times a day until 6 months after his or her last day of rad tx, and then x2 a week after that, indefinitely. Among other modifications for days when pt cannot functionally swallow, SLP also suggested pt  to perform only non-swallowing tasks on the handout/HEP, and if necessary to cycle through the swallowing portion so the full program of exercises can be completed instead of fatiguing on one of the swallowing exercises and being unable to perform the other swallowing exercises. SLP instructed that swallowing exercises should then be added back into the regimen as pt is able to do so.   PATIENT EDUCATION: Education details: modification to HEP when difficulty experienced with swallowing during and after radiation course, and see "today's treatment" for details Person educated: Patient and Spouse Education method: Explanation Education comprehension: verbalized understanding and needs further education   ASSESSMENT:  CLINICAL IMPRESSION: Patient is a 72 y.o. M who was seen today for treatment of swallowing following chemoradiation therapy (last day of rad was 07/05/23). Today pt drank thin liquids without overt s/s oral or pharyngeal difficulty. At this time pt swallowing is deemed WNL/WFL with these POs. Pt reports he was eating grits until last week but now no solid POs mainly due to odynophagia and dysgeusia. No oral or overt s/sx pharyngeal deficits, including aspiration were observed. There are no overt s/s aspiration  PNA observed by SLP nor any reported by pt at this time. Data indicate that pt's swallow ability will likely decrease over the course of radiation/chemoradiation therapy and could very well decline over time following the conclusion of that therapy due to muscle disuse atrophy and/or muscle fibrosis. Pt will cont to need to be seen by SLP in order to assess safety of PO intake, assess the need for recommending any objective swallow assessment, and ensuring pt is correctly completing the individualized HEP.  OBJECTIVE IMPAIRMENTS: include dysphagia. These impairments are limiting patient from safety when swallowing. Factors affecting potential to achieve goals and functional outcome are  none noted . Patient will benefit from skilled SLP services to address above impairments and improve overall function.  REHAB POTENTIAL: Good  SHORT TERM GOALS: Target: 3rd total session   Pt will complete HEP with modified independence in 2 sessions Baseline: 07/12/23 Goal status: Initial   2.  pt will tell SLP why pt is completing HEP with modified independence Baseline:  Goal status: Met   3.  pt will describe 3 overt s/s aspiration PNA with modified independence Baseline:  Goal status: Initial   4.  pt will tell SLP how a food journal could hasten return to a more normalized diet Baseline:  Goal status: Met     LONG TERM GOALS: Target: 7th total session   1.  pt will complete HEP with independence over two visits Baseline:  Goal status: Initial   2.  pt will describe how to modify HEP over time, and the timeline associated with reduction in HEP frequency with modified independence over two sessions Baseline:  Goal status: Initial   PLAN:   SLP FREQUENCY:  once approx every 4 weeks   SLP DURATION:  6 sessions   PLANNED INTERVENTIONS: Aspiration precaution training, Pharyngeal strengthening exercises, Diet toleration management , Environmental controls, Trials of upgraded texture/liquids,  Cueing hierachy, Internal/external aids, SLP instruction and feedback, Compensatory strategies, and Patient/family education    [facility], CCC-SLP 07/12/2023, 8:35 AM

## 2023-07-13 ENCOUNTER — Inpatient Hospital Stay: Payer: Medicare Other

## 2023-07-13 ENCOUNTER — Inpatient Hospital Stay: Payer: Medicare Other | Admitting: Dietician

## 2023-07-13 ENCOUNTER — Other Ambulatory Visit: Payer: Self-pay

## 2023-07-13 VITALS — BP 126/66 | HR 65 | Resp 14

## 2023-07-13 DIAGNOSIS — Z95828 Presence of other vascular implants and grafts: Secondary | ICD-10-CM

## 2023-07-13 DIAGNOSIS — C090 Malignant neoplasm of tonsillar fossa: Secondary | ICD-10-CM

## 2023-07-13 DIAGNOSIS — N201 Calculus of ureter: Secondary | ICD-10-CM

## 2023-07-13 DIAGNOSIS — R634 Abnormal weight loss: Secondary | ICD-10-CM

## 2023-07-13 DIAGNOSIS — Z510 Encounter for antineoplastic radiation therapy: Secondary | ICD-10-CM | POA: Diagnosis not present

## 2023-07-13 DIAGNOSIS — M25541 Pain in joints of right hand: Secondary | ICD-10-CM

## 2023-07-13 MED ORDER — SODIUM CHLORIDE 0.9 % IV SOLN: INTRAVENOUS | Status: AC

## 2023-07-13 MED ORDER — SODIUM CHLORIDE 0.9% FLUSH
10.0000 mL | Freq: Once | INTRAVENOUS | Status: AC
  Administered 2023-07-13: 10 mL via INTRAVENOUS

## 2023-07-13 MED ORDER — HEPARIN SOD (PORK) LOCK FLUSH 100 UNIT/ML IV SOLN
500.0000 [IU] | Freq: Once | INTRAVENOUS | Status: AC
  Administered 2023-07-13: 500 [IU] via INTRAVENOUS

## 2023-07-13 NOTE — Patient Instructions (Signed)

## 2023-07-13 NOTE — Progress Notes (Signed)
Nutrition Follow-up:  Patient has completed concurrent chemoradiation therapy for SCC of right tonsil. Final radiation 7/11. Patient is currently receiving supportive therapy with IV hydration.   Met with patient and wife in infusion. Patient reports right sided throat pain. States the pain seems to be getting worse. Saliva is thick and reports metallic/charcoal taste. He did not find sprite/ginger ale rinse helpful. Patient is not eating orally. He is taking small sips of water which is painful. He reports dreaming about food. Patient is giving 5-6 cartons of Osmolite 1.5. Reports intermittent episodes of presyncope continue. Recalls episode this morning as he was getting out of the car.    Medications: reviewed   Labs: no new labs for review  Anthropometrics: no new wts. Last wt 181 lb on 7/15 - stable  Estimated Energy Needs   Kcals: 2300-2600 Protein: 107-130 g Fluid: >/=  2.3 L   NUTRITION DIAGNOSIS: Predicted suboptimal intake continues - addressing with TF  INTERVENTION:  Provided samples of [employer] 1.4 for pt to try Continue baking soda salt water rinses several times daily Encouraged trying small bites of soft smooth textures as tolerated Support and encouragement     MONITORING, EVALUATION, GOAL: weight trends, TF   NEXT VISIT: Thursday July 25 during infusion

## 2023-07-16 ENCOUNTER — Other Ambulatory Visit: Payer: Self-pay | Admitting: *Deleted

## 2023-07-16 ENCOUNTER — Inpatient Hospital Stay: Payer: Medicare Other

## 2023-07-16 ENCOUNTER — Other Ambulatory Visit: Payer: Self-pay

## 2023-07-16 VITALS — BP 119/67 | HR 73 | Temp 98.0°F | Resp 17 | Wt 179.5 lb

## 2023-07-16 DIAGNOSIS — N201 Calculus of ureter: Secondary | ICD-10-CM

## 2023-07-16 DIAGNOSIS — C090 Malignant neoplasm of tonsillar fossa: Secondary | ICD-10-CM

## 2023-07-16 DIAGNOSIS — R309 Painful micturition, unspecified: Secondary | ICD-10-CM

## 2023-07-16 DIAGNOSIS — R634 Abnormal weight loss: Secondary | ICD-10-CM

## 2023-07-16 DIAGNOSIS — M25542 Pain in joints of left hand: Secondary | ICD-10-CM

## 2023-07-16 DIAGNOSIS — Z95828 Presence of other vascular implants and grafts: Secondary | ICD-10-CM

## 2023-07-16 DIAGNOSIS — Z510 Encounter for antineoplastic radiation therapy: Secondary | ICD-10-CM | POA: Diagnosis not present

## 2023-07-16 LAB — CBC WITH DIFFERENTIAL (CANCER CENTER ONLY)
Abs Immature Granulocytes: 0.03 10*3/uL (ref 0.00–0.07)
Basophils Absolute: 0 10*3/uL (ref 0.0–0.1)
Basophils Relative: 1 %
Eosinophils Absolute: 0 10*3/uL (ref 0.0–0.5)
Eosinophils Relative: 1 %
HCT: 32 % — ABNORMAL LOW (ref 39.0–52.0)
Hemoglobin: 11.3 g/dL — ABNORMAL LOW (ref 13.0–17.0)
Immature Granulocytes: 1 %
Lymphocytes Relative: 10 %
Lymphs Abs: 0.3 10*3/uL — ABNORMAL LOW (ref 0.7–4.0)
MCH: 35.1 pg — ABNORMAL HIGH (ref 26.0–34.0)
MCHC: 35.3 g/dL (ref 30.0–36.0)
MCV: 99.4 fL (ref 80.0–100.0)
Monocytes Absolute: 0.6 10*3/uL (ref 0.1–1.0)
Monocytes Relative: 20 %
Neutro Abs: 1.9 10*3/uL (ref 1.7–7.7)
Neutrophils Relative %: 67 %
Platelet Count: 219 10*3/uL (ref 150–400)
RBC: 3.22 MIL/uL — ABNORMAL LOW (ref 4.22–5.81)
RDW: 16.4 % — ABNORMAL HIGH (ref 11.5–15.5)
WBC Count: 2.8 10*3/uL — ABNORMAL LOW (ref 4.0–10.5)
nRBC: 0 % (ref 0.0–0.2)

## 2023-07-16 LAB — BASIC METABOLIC PANEL - CANCER CENTER ONLY
Anion gap: 5 (ref 5–15)
BUN: 22 mg/dL (ref 8–23)
CO2: 28 mmol/L (ref 22–32)
Calcium: 9 mg/dL (ref 8.9–10.3)
Chloride: 103 mmol/L (ref 98–111)
Creatinine: 0.94 mg/dL (ref 0.61–1.24)
GFR, Estimated: >60 mL/min (ref >60)
Glucose, Bld: 83 mg/dL (ref 70–99)
Potassium: 4 mmol/L (ref 3.5–5.1)
Sodium: 136 mmol/L (ref 135–145)

## 2023-07-16 LAB — MAGNESIUM: Magnesium: 1.9 mg/dL (ref 1.7–2.4)

## 2023-07-16 MED ORDER — SODIUM CHLORIDE 0.9% FLUSH
10.0000 mL | Freq: Once | INTRAVENOUS | Status: AC
  Administered 2023-07-16: 10 mL

## 2023-07-16 MED ORDER — HEPARIN SOD (PORK) LOCK FLUSH 100 UNIT/ML IV SOLN
500.0000 [IU] | Freq: Once | INTRAVENOUS | Status: AC
  Administered 2023-07-16: 500 [IU]

## 2023-07-16 MED ORDER — HEPARIN SOD (PORK) LOCK FLUSH 100 UNIT/ML IV SOLN: 500.0000 [IU] | Freq: Once | INTRAVENOUS | Status: DC

## 2023-07-16 MED ORDER — SODIUM CHLORIDE 0.9 % IV SOLN: INTRAVENOUS | Status: AC

## 2023-07-17 ENCOUNTER — Other Ambulatory Visit: Payer: Self-pay

## 2023-07-17 ENCOUNTER — Encounter: Payer: Self-pay | Admitting: Radiation Oncology

## 2023-07-17 ENCOUNTER — Other Ambulatory Visit: Payer: Self-pay | Admitting: Dietician

## 2023-07-17 ENCOUNTER — Encounter: Payer: Self-pay | Admitting: Dietician

## 2023-07-17 ENCOUNTER — Telehealth: Payer: Self-pay

## 2023-07-17 ENCOUNTER — Inpatient Hospital Stay: Payer: Medicare Other

## 2023-07-17 ENCOUNTER — Ambulatory Visit
Admission: RE | Admit: 2023-07-17 | Discharge: 2023-07-17 | Disposition: A | Discharge status: Home / Self Care | Payer: Medicare Other | Source: Ambulatory Visit | Attending: Radiation Oncology | Admitting: Radiation Oncology

## 2023-07-17 VITALS — BP 107/64 | HR 72 | Temp 97.9°F | Resp 20 | Ht 75.0 in | Wt 181.8 lb

## 2023-07-17 VITALS — BP 100/57 | HR 80 | Temp 98.0°F | Resp 18

## 2023-07-17 DIAGNOSIS — C090 Malignant neoplasm of tonsillar fossa: Secondary | ICD-10-CM

## 2023-07-17 DIAGNOSIS — Z510 Encounter for antineoplastic radiation therapy: Secondary | ICD-10-CM | POA: Diagnosis not present

## 2023-07-17 DIAGNOSIS — Z95828 Presence of other vascular implants and grafts: Secondary | ICD-10-CM

## 2023-07-17 DIAGNOSIS — M25541 Pain in joints of right hand: Secondary | ICD-10-CM

## 2023-07-17 DIAGNOSIS — R309 Painful micturition, unspecified: Secondary | ICD-10-CM

## 2023-07-17 DIAGNOSIS — R1310 Dysphagia, unspecified: Secondary | ICD-10-CM | POA: Insufficient documentation

## 2023-07-17 DIAGNOSIS — Z923 Personal history of irradiation: Secondary | ICD-10-CM | POA: Insufficient documentation

## 2023-07-17 DIAGNOSIS — N201 Calculus of ureter: Secondary | ICD-10-CM

## 2023-07-17 DIAGNOSIS — R634 Abnormal weight loss: Secondary | ICD-10-CM

## 2023-07-17 LAB — URINALYSIS, COMPLETE (UACMP) WITH MICROSCOPIC
Bacteria, UA: NONE SEEN
Bilirubin Urine: NEGATIVE
Glucose, UA: NEGATIVE mg/dL
Hgb urine dipstick: NEGATIVE
Ketones, ur: NEGATIVE mg/dL
Leukocytes,Ua: NEGATIVE
Nitrite: NEGATIVE
Protein, ur: NEGATIVE mg/dL
Specific Gravity, Urine: 1.01 (ref 1.005–1.030)
pH: 7 (ref 5.0–8.0)

## 2023-07-17 MED ORDER — KATE FARMS STANDARD 1.4 EN LIQD: ENTERAL | Status: DC

## 2023-07-17 MED ORDER — SODIUM CHLORIDE 0.9 % IV SOLN: INTRAVENOUS | Status: AC

## 2023-07-17 MED ORDER — HEPARIN SOD (PORK) LOCK FLUSH 100 UNIT/ML IV SOLN
500.0000 [IU] | Freq: Once | INTRAVENOUS | Status: AC
  Administered 2023-07-17: 500 [IU]

## 2023-07-17 MED ORDER — SODIUM CHLORIDE 0.9% FLUSH
10.0000 mL | Freq: Once | INTRAVENOUS | Status: AC
  Administered 2023-07-17: 10 mL

## 2023-07-17 NOTE — Patient Instructions (Signed)

## 2023-07-17 NOTE — Telephone Encounter (Signed)
RN left message for pt after failed attemtpt to reach pt for meaningful use. Rn left call back information for pt.

## 2023-07-17 NOTE — Progress Notes (Signed)
Met briefly with patient and wife following IVF. Patient reports tolerating Molli Posey formula much better than Osmolite. Patient states bowl movements are regular, indigestion as resolved, and blood pressure has improved.    Will write new orders for [facility] 1.4   Give one (325 ml) carton [employer] 1.4 - 5x/day. Flush tube with 60 ml water before and after bolus. Drink by mouth or give via tube additional 711 ml water (3 cups) daily. This provides 1625 ml/day, 2275 kcal, 100 g protein, 1154 ml free water (2345 ml total water with flushes)

## 2023-07-17 NOTE — Progress Notes (Signed)
Radiation Oncology         [phone] ________________________________  Name: Ethan [name] MRN: [id number]  Date: 07/17/2023  DOB: 03-25-1951  Follow-Up Visit Note  CC: Practice, [facility]  Ethan Skains, MD  Diagnosis and Prior Radiotherapy:       ICD-10-CM   1. 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     2. Cancer of tonsillar fossa (HCC)  C09.0       Cancer Staging  Cancer of tonsillar fossa (HCC) Staging form: Pharynx - HPV-Mediated Oropharynx, AJCC 8th Edition - Pathologic stage from 04/27/2023: Stage II (pT2, pN2, cM0, p16+) - Signed by Lonie Peak, MD on 04/27/2023 Stage prefix: Initial diagnosis   CHIEF COMPLAINT:  Here for follow-up and surveillance of tonsillar cancer. He completed radiation on 07/05/23  Narrative:  The patient returns today for routine follow-up.  He completed radiation 2 weeks ago.                    He does feel reassured by improvement in his symptoms  Pain issues, if any: Yes 8/10 upon swallowing. Using a feeding tube?: Yes, uses completely.  Weight changes, if any: Yes, roughly 25 lbs x4 months Wt Readings from Last 3 Encounters:  07/17/23 181 lb 12.8 oz (82.5 kg)  07/16/23 179 lb 8 oz (81.4 kg)  07/09/23 181 lb (82.1 kg)   Swallowing issues, if any: Yes- Severe dysphagia. Using Xylocaine and oxycodone.  Smoking or chewing tobacco? None Using fluoride toothpaste daily? Yes Last ENT visit was on: 02/07/2023 w/ Dr. Christia Reading- Otolaryngology Other notable issues, if any: Moderate fatigue  ALLERGIES:  has No Known Allergies.  Meds: Current Outpatient Medications  Medication Sig Dispense Refill   augmented betamethasone dipropionate (DIPROLENE-AF) 0.05 % cream APPLY TO RASH ON THE SKIN TWICE DAILY FOR 2 3 WEEKS.     lidocaine (XYLOCAINE) 2 % solution Use as directed 15 mLs in the mouth or throat as needed for mouth pain. 100 mL 0   Nutritional Supplements ([employer] STANDARD 1.4) LIQD [employer] 1.4 -  Give one (325 ml) carton via PEG 5x/day. Flush tube with 60 ml water before and after each bolus. Drink by mouth or give via tube additional 711 ml (3 cups) water daily. This provides 2275 kcal, 100 grams protein, 1154 ml free water from formula (2345 ml total water with flushes). Meets 100% RDI     oxyCODONE (OXY IR/ROXICODONE) 5 MG immediate release tablet Take 1 tablet (5 mg total) by mouth every 6 (six) hours as needed for severe pain. 45 tablet 0   sulfamethoxazole-trimethoprim (BACTRIM DS) 800-160 MG tablet Take 1 tablet by mouth 2 (two) times daily. 14 tablet 0   tamsulosin (FLOMAX) 0.4 MG CAPS capsule Take 2 capsules (0.8 mg total) by mouth daily after breakfast. 60 capsule 0   No current facility-administered medications for this encounter.    Physical Findings: The patient is in no acute distress. Patient is alert and oriented. Wt Readings from Last 3 Encounters:  07/17/23 181 lb 12.8 oz (82.5 kg)  07/16/23 179 lb 8 oz (81.4 kg)  07/09/23 181 lb (82.1 kg)    height is 6\' 3"  (1.905 m) and weight is 181 lb 12.8 oz (82.5 kg). His temporal temperature is 97.9 F (36.6 C). His blood pressure is 107/64 and his pulse is 72. His respiration is 20 and oxygen saturation is 100%. .  General: Alert and oriented, in no acute distress HEENT: Head  is normocephalic. Extraocular movements are intact. Oropharynx is notable for deviated uvula  and much improved erythema. No thrush Neck: Neck is notable for no cervical, supraclavicular, or axillary lymphadenopathy. Skin: Skin in treatment fields shows hyperpigmentation and dryness in the treatment fields. Some tenderness to palpation.  Heart: Regular in rate and rhythm with no murmurs, rubs, or gallops. Chest: Clear to auscultation bilaterally, with no rhonchi, wheezes, or rales. Abdomen: Soft, nontender, nondistended, with no rigidity or guarding. Extremities: No cyanosis or edema. Lymphatics: see Neck Exam Psychiatric: Judgment and insight are  intact. Affect is appropriate.   Lab Findings: Lab Results  Component Value Date   WBC 2.8 (L) 07/16/2023   HGB 11.3 (L) 07/16/2023   HCT 32.0 (L) 07/16/2023   MCV 99.4 07/16/2023   PLT 219 07/16/2023    Lab Results  Component Value Date   TSH 0.853 03/06/2023    Radiographic Findings: No results found.  Impression/Plan:    1) Head and Neck Cancer Status: Patient is healing well from the radiation treatment. He continues to experience severe odynophagia, increased secretions, and altered taste. We reviewed the typical timeline of healing from head and neck radiation.  He knows to apply lotion to heal skin   2) Nutritional Status: stable Wt Readings from Last 3 Encounters:  07/17/23 181 lb 12.8 oz (82.5 kg)  07/16/23 179 lb 8 oz (81.4 kg)  07/09/23 181 lb (82.1 kg)  PEG tube: In-place. Has transitioned to [facility] which he feels a lot better on.   3) Risk Factors: The patient has been educated about risk factors including alcohol and tobacco abuse; they understand that avoidance of alcohol and tobacco is important to prevent recurrences as well as other cancers  4) Swallowing: in completing his swallowing exercises and continues to see SLP.   5) Dental: Encouraged to continue regular followup with dentistry, and dental hygiene including fluoride rinses. Using fluoride rinses approximately every other night.   6) Thyroid function:  Lab Results  Component Value Date   TSH 0.853 03/06/2023   7) Will get a CT scan of the neck and chest  with contrast in 2.5 months with a follow-up with [place] 3-4 days afterwards. The patient was encouraged to call with any issues or questions before then. He knows to call Dr Hezzie Bump to schedule f/u - per his note, he'd like to see pt 81mo post RT.   On date of service, in total, I spent 20 minutes on this encounter. Patient was seen in person. _____________________________________   Joyice Faster, PA-C    Lonie Peak, MD

## 2023-07-18 ENCOUNTER — Other Ambulatory Visit: Payer: Self-pay

## 2023-07-18 ENCOUNTER — Telehealth: Payer: Self-pay | Admitting: *Deleted

## 2023-07-18 ENCOUNTER — Inpatient Hospital Stay: Payer: Medicare Other

## 2023-07-18 ENCOUNTER — Encounter: Payer: Self-pay | Admitting: Radiation Oncology

## 2023-07-18 DIAGNOSIS — C090 Malignant neoplasm of tonsillar fossa: Secondary | ICD-10-CM

## 2023-07-18 NOTE — Telephone Encounter (Signed)
CALLED PATIENT TO INFORM OF FU APPT. WITH DR. Basilio Cairo ON 10-09-23 @ 2:40 PM, LVM FOR A RETURN CALL

## 2023-07-18 NOTE — Progress Notes (Signed)
Oncology Nurse Navigator Documentation   I met with Ethan [name] during his follow up appointment with Dr. Basilio Cairo today. He is slowly recovering from radiation and chemotherapy. He will return to see Dr. Basilio Cairo in 3 months after having post treatment CT scans for results. He and his wife know to call me if they have any needs or concerns at any time.   Hedda Slade RN, BSN, OCN Head & Neck Oncology Nurse Navigator  [facility] Phone # [phone]  Fax # [phone]

## 2023-07-19 ENCOUNTER — Encounter: Payer: Self-pay | Admitting: Adult Health

## 2023-07-19 ENCOUNTER — Inpatient Hospital Stay (HOSPITAL_BASED_OUTPATIENT_CLINIC_OR_DEPARTMENT_OTHER): Payer: Medicare Other | Admitting: Adult Health

## 2023-07-19 ENCOUNTER — Other Ambulatory Visit: Payer: Self-pay

## 2023-07-19 ENCOUNTER — Inpatient Hospital Stay: Payer: Medicare Other

## 2023-07-19 ENCOUNTER — Inpatient Hospital Stay: Payer: Medicare Other | Admitting: Dietician

## 2023-07-19 VITALS — BP 110/61 | HR 80 | Temp 97.7°F | Resp 18 | Ht 75.0 in | Wt 183.0 lb

## 2023-07-19 DIAGNOSIS — C090 Malignant neoplasm of tonsillar fossa: Secondary | ICD-10-CM

## 2023-07-19 DIAGNOSIS — Z95828 Presence of other vascular implants and grafts: Secondary | ICD-10-CM

## 2023-07-19 DIAGNOSIS — R634 Abnormal weight loss: Secondary | ICD-10-CM

## 2023-07-19 DIAGNOSIS — M25541 Pain in joints of right hand: Secondary | ICD-10-CM

## 2023-07-19 DIAGNOSIS — N201 Calculus of ureter: Secondary | ICD-10-CM

## 2023-07-19 DIAGNOSIS — Z510 Encounter for antineoplastic radiation therapy: Secondary | ICD-10-CM | POA: Diagnosis not present

## 2023-07-19 MED ORDER — SODIUM CHLORIDE 0.9% FLUSH
10.0000 mL | Freq: Once | INTRAVENOUS | Status: AC
  Administered 2023-07-19: 10 mL

## 2023-07-19 MED ORDER — SODIUM CHLORIDE 0.9 % IV SOLN: INTRAVENOUS | Status: AC

## 2023-07-19 MED ORDER — HEPARIN SOD (PORK) LOCK FLUSH 100 UNIT/ML IV SOLN
500.0000 [IU] | Freq: Once | INTRAVENOUS | Status: AC
  Administered 2023-07-19: 500 [IU]

## 2023-07-19 NOTE — Progress Notes (Signed)
Patient has completed concurrent chemoradiation therapy for SCC of right tonsil. Final radiation 7/11.   Brief nutrition follow-up completed with patient and wife in infusion. Patient receiving supportive therapy with IVF.   Patient reports feeling slightly better today. He continues to have significant throat pain. Patient unable to taste. States it wouldn't matter if I could because I can't swallow anything. Patient received shipment of [employer] 1.4 last night. Wife reports there was only 2 cases delivered. RD currently awaiting return call from [employer].   Continue trying small bites/sips as tolerated Continue baking soda salt water rinses Continue 5 cartons [employer] 1.4   Next Visit: Tuesday August 6 via telephone

## 2023-07-19 NOTE — Progress Notes (Signed)
[facility] Cancer Follow up:    Practice, [facility] [address] [place] [address]   DIAGNOSIS:  Cancer Staging  Cancer of tonsillar fossa [facility]) Staging form: Pharynx - HPV-Mediated Oropharynx, AJCC 8th Edition - Pathologic stage from 04/27/2023: Stage II (pT2, pN2, cM0, p16+) - Signed by Lonie Peak, MD on 04/27/2023 Stage prefix: Initial diagnosis   SUMMARY OF ONCOLOGIC HISTORY: Oncology History  Cancer of tonsillar fossa (HCC)  02/07/2023 Initial Biopsy   Biopsy of the right tonsil  revealed: squamous cell carcinoma, p16 positive.    03/12/2023 PET scan   Right tonsillar mass as tracer avid and concerning for malignancy; hypermetabolic right level 2 and level 3 cervical lymph nodes compatible with metastatic adenopathy; indeterminate tracer avid right upper lobe elongated area of increased soft tissue thickening within the medial right upper lobe (SUV max of 5.08); and nonspecific mild tracer avid left hilar lymph nodes.    03/20/2023 Miscellaneous   Dentistry evaluation: Extractions of #18 and #19 with Dr. Azucena Cecil   04/06/2023 Surgery   TORS resection (Dr. Hezzie Bump): 2.7 cm tumor, histology of nonkeratinizing squamous cell carcinoma with focal maturation, HPV associated; + LVI and PNI; all margins negative.  8/12 + LN including right level 2A, 3, and 4 being +.  Extranodal extension present, largest tumor deposit 2.6cm.     04/27/2023 Cancer Staging   Staging form: Pharynx - HPV-Mediated Oropharynx, AJCC 8th Edition - Pathologic stage from 04/27/2023: Stage II (pT2, pN2, cM0, p16+) - Signed by Lonie Peak, MD on 04/27/2023 Stage prefix: Initial diagnosis   05/22/2023 - 06/26/2023 Chemotherapy   Patient is on Treatment Plan : HEAD/NECK Cisplatin (40) q7d     05/23/2023 - 07/04/2023 Radiation Therapy   Concurrent chemoradiation     CURRENT THERAPY: Observation, s/p concurrent chemoradiation  INTERVAL HISTORY: Ethan [name] 72 y.o. male  returns for follow-up of his head neck cancer.  He previously completed concurrent chemoradiation.  He has been receiving IV fluids daily for the past couple of weeks due to hypotension and dehydration.  He feels like his strength is starting to come back and he credits a lot of it to the [employer] tube feeds in contrast to the Osmolite.  Today is his last day of IV fluids and he does not want to have his labs done as he is feeling somewhat better and his labs on Monday were normal.   Patient Active Problem List   Diagnosis Date Noted   Port-A-Cath in place 05/29/2023   Cancer of tonsillar fossa (HCC) 03/07/2023   Arthralgia 08/29/2018   Weight loss 08/29/2018   Ureteral calculus, right 03/11/2015   Ureteral calculus 03/11/2015   Aftercare following surgery of the circulatory system, NEC 01/12/2014   Abdominal aneurysm without mention of rupture 07/08/2012    has No Known Allergies.  MEDICAL HISTORY: Past Medical History:  Diagnosis Date   AAA (abdominal aortic aneurysm) (HCC)    Hyperlipidemia    Kidney stones    Renal disorder     SURGICAL HISTORY: Past Surgical History:  Procedure Laterality Date   ABDOMINAL AORTIC ANEURYSM REPAIR  06/15/2011   EVAR   CYSTOSCOPY WITH RETROGRADE PYELOGRAM, URETEROSCOPY AND STENT PLACEMENT Right 03/11/2015   Procedure: CYSTOSCOPY WITH RETROGRADE PYELOGRAM, AND RIGHT URETERAL STENT PLACEMENT;  Surgeon: Barron Alvine, MD;  Location: [facility];  Service: Urology;  Laterality: Right;   HERNIA REPAIR     IR GASTROSTOMY TUBE MOD SED  05/28/2023   IR IMAGING  GUIDED PORT INSERTION  05/28/2023   IR NASO G TUBE PLC W/FL-NO RAD  05/28/2023   LOWER BACK FUSION, L2-L3,AND L3-L4     RIGHT LOWER TIB-FEM FRACTURE     SHOULDER SURGERY Bilateral    SPINE SURGERY     THROAT SURGERY  04/06/2023    SOCIAL HISTORY: Social History   Socioeconomic History   Marital status: Married    Spouse name: Not on file   Number of children: Not on file   Years of education:  Not on file   Highest education level: Not on file  Occupational History   Not on file  Tobacco Use   Smoking status: Some Days    Current packs/day: 0.50    Types: Cigarettes   Smokeless tobacco: Never  Vaping Use   Vaping status: Never Used  Substance and Sexual Activity   Alcohol use: Yes    Alcohol/week: 0.0 standard drinks of alcohol    Comment: occ   Drug use: No   Sexual activity: Not Currently  Other Topics Concern   Not on file  Social History Narrative   Not on file   Social Determinants of Health   Financial Resource Strain: Not on file  Food Insecurity: No Food Insecurity (07/17/2023)   Hunger Vital Sign    Worried About Running Out of Food in the Last Year: Never true    Ran Out of Food in the Last Year: Never true  Transportation Needs: No Transportation Needs (07/17/2023)   PRAPARE - Administrator, Civil Service (Medical): No    Lack of Transportation (Non-Medical): No  Physical Activity: Not on file  Stress: Not on file  Social Connections: Not on file  Intimate Partner Violence: Not At Risk (07/17/2023)   Humiliation, Afraid, Rape, and Kick questionnaire    Fear of Current or Ex-Partner: No    Emotionally Abused: No    Physically Abused: No    Sexually Abused: No    FAMILY HISTORY: Family History  Problem Relation Age of Onset   Aneurysm Father    Stroke Mother    Heart disease Mother        Aneurysm   Hypertension Sister    Alcohol abuse Brother    Cancer Brother        mouth cancer   Aneurysm Paternal Uncle     Review of Systems  Constitutional:  Positive for fatigue. Negative for appetite change, chills, fever and unexpected weight change.  HENT:   Positive for sore throat. Negative for hearing loss, lump/mass, mouth sores and trouble swallowing.   Eyes:  Negative for eye problems and icterus.  Respiratory:  Negative for chest tightness, cough and shortness of breath.   Cardiovascular:  Negative for chest pain, leg swelling  and palpitations.  Gastrointestinal:  Negative for abdominal distention, abdominal pain, constipation, diarrhea, nausea and vomiting.  Endocrine: Negative for hot flashes.  Genitourinary:  Negative for difficulty urinating.   Musculoskeletal:  Negative for arthralgias.  Skin:  Negative for itching and rash.  Neurological:  Negative for dizziness, extremity weakness, headaches and numbness.  Hematological:  Negative for adenopathy. Does not bruise/bleed easily.  Psychiatric/Behavioral:  Negative for depression. The patient is not nervous/anxious.       PHYSICAL EXAMINATION   Onc Performance Status - 07/19/23 1207       ECOG Perf Status   ECOG Perf Status Restricted in physically strenuous activity but ambulatory and able to carry out work of a light or sedentary  nature, e.g., light house work, office work      KPS SCALE   KPS % SCORE Cares for self, unable to carry on normal activity or to do active work             Vitals:   07/19/23 1150  BP: 110/61  Pulse: 80  Resp: 18  Temp: 97.7 F (36.5 C)  SpO2: 100%    Physical Exam Constitutional:      General: He is not in acute distress.    Appearance: Normal appearance. He is not toxic-appearing.  HENT:     Head: Normocephalic and atraumatic.     Mouth/Throat:     Mouth: Mucous membranes are moist.     Pharynx: Oropharyngeal exudate (radiation changes noted in posterior oropharynx) and posterior oropharyngeal erythema present.  Eyes:     General: No scleral icterus. Cardiovascular:     Rate and Rhythm: Normal rate and regular rhythm.     Pulses: Normal pulses.     Heart sounds: Normal heart sounds.  Pulmonary:     Effort: Pulmonary effort is normal.     Breath sounds: Normal breath sounds.  Abdominal:     General: Abdomen is flat. Bowel sounds are normal. There is no distension.     Palpations: Abdomen is soft.     Tenderness: There is no abdominal tenderness.  Musculoskeletal:        General: No swelling.      Cervical back: Neck supple.  Lymphadenopathy:     Cervical: No cervical adenopathy.  Skin:    General: Skin is warm and dry.     Findings: No rash.  Neurological:     General: No focal deficit present.     Mental Status: He is alert.  Psychiatric:        Mood and Affect: Mood normal.        Behavior: Behavior normal.       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  He is here today for evaluation prior to receiving Cisplatin.  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  He has no signs of recurrence today and is actually improving. Dehydration: His blood pressure has stabilized.  I offered to discontinue his IV fluids today however since he has an appointment with nutrition he will go ahead and get his IV fluids. Weight loss: He sees nutrition today and I am hopeful they will be able to continue him on the [facility] tube feeds.  Ethan [name] will return in 6 weeks for labs and follow-up with Dr. Al Pimple.  He knows to call for any questions or concerns that may arise between now and his next appointment.    All questions were answered. The patient knows to call the clinic with any problems, questions or concerns. We can certainly see the patient much sooner if necessary.  Total encounter time:30 minutes*in face-to-face visit time, chart review, lab review, care coordination, order entry, and documentation of the encounter time.    Lillard Anes, NP 07/19/23 1:36 PM Medical Oncology and Hematology [facility] [address] [place], [place] [address] Tel. [phone]    Fax. [phone]  *Total Encounter Time as defined by the Centers for Medicare and Medicaid Services includes, in addition to the face-to-face time of a  patient visit  (documented in the note above) non-face-to-face time: obtaining and reviewing outside history, ordering and reviewing medications, tests or procedures, care coordination (communications with other health care professionals or caregivers) and documentation in the medical record.

## 2023-07-19 NOTE — Assessment & Plan Note (Signed)
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  He is here today for evaluation prior to receiving Cisplatin.  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  He has no signs of recurrence today and is actually improving. Dehydration: His blood pressure has stabilized.  I offered to discontinue his IV fluids today however since he has an appointment with nutrition he will go ahead and get his IV fluids. Weight loss: He sees nutrition today and I am hopeful they will be able to continue him on the [facility] tube feeds.  Demarqus will return in 6 weeks for labs and follow-up with Dr. Al Pimple.  He knows to call for any questions or concerns that may arise between now and his next appointment.

## 2023-07-20 ENCOUNTER — Telehealth: Payer: Self-pay | Admitting: Adult Health

## 2023-07-20 NOTE — Telephone Encounter (Signed)
Scheduled appointments per 7/25 los. Patient is aware of the made appointments. Ethan [name], due to the patient wanting only morning appointments, his FU appointments were pushed to 7 weeks instead of 6 (per the los from [employer])

## 2023-07-23 ENCOUNTER — Ambulatory Visit: Payer: Self-pay | Admitting: Radiation Oncology

## 2023-07-23 ENCOUNTER — Ambulatory Visit: Payer: Medicare Other | Admitting: Physical Therapy

## 2023-07-23 ENCOUNTER — Encounter: Payer: Self-pay | Admitting: Physical Therapy

## 2023-07-23 DIAGNOSIS — C090 Malignant neoplasm of tonsillar fossa: Secondary | ICD-10-CM

## 2023-07-23 DIAGNOSIS — R293 Abnormal posture: Secondary | ICD-10-CM

## 2023-07-23 DIAGNOSIS — R1310 Dysphagia, unspecified: Secondary | ICD-10-CM | POA: Diagnosis not present

## 2023-07-23 NOTE — Therapy (Signed)
OUTPATIENT PHYSICAL THERAPY HEAD AND NECK POST RADIATION FOLLOW UP   Patient Name: Ethan [name] MRN: [id number] DOB:[birth year], 72 y.o., male Today's Date: 07/23/2023  END OF SESSION:  PT End of Session - 07/23/23 0950     Visit Number 2    Number of Visits 2    Date for PT Re-Evaluation 07/26/23    PT Start Time 0856    PT Stop Time 0938    PT Time Calculation (min) 42 min    Activity Tolerance Patient tolerated treatment well    Behavior During Therapy [facility] for tasks assessed/performed             Past Medical History:  Diagnosis Date   AAA (abdominal aortic aneurysm) (HCC)    Hyperlipidemia    Kidney stones    Renal disorder    Past Surgical History:  Procedure Laterality Date   ABDOMINAL AORTIC ANEURYSM REPAIR  06/15/2011   EVAR   CYSTOSCOPY WITH RETROGRADE PYELOGRAM, URETEROSCOPY AND STENT PLACEMENT Right 03/11/2015   Procedure: CYSTOSCOPY WITH RETROGRADE PYELOGRAM, AND RIGHT URETERAL STENT PLACEMENT;  Surgeon: Barron Alvine, MD;  Location: [facility];  Service: Urology;  Laterality: Right;   HERNIA REPAIR     IR GASTROSTOMY TUBE MOD SED  05/28/2023   IR IMAGING GUIDED PORT INSERTION  05/28/2023   IR NASO G TUBE PLC W/FL-NO RAD  05/28/2023   LOWER BACK FUSION, L2-L3,AND L3-L4     RIGHT LOWER TIB-FEM FRACTURE     SHOULDER SURGERY Bilateral    SPINE SURGERY     THROAT SURGERY  04/06/2023   Patient Active Problem List   Diagnosis Date Noted   Port-A-Cath in place 05/29/2023   Cancer of tonsillar fossa (HCC) 03/07/2023   Arthralgia 08/29/2018   Weight loss 08/29/2018   Ureteral calculus, right 03/11/2015   Ureteral calculus 03/11/2015   Aftercare following surgery of the circulatory system, NEC 01/12/2014   Abdominal aneurysm without mention of rupture 07/08/2012    PCP: [name], NP   REFERRING PROVIDER: Lonie Peak, MD   REFERRING DIAG: C09.0 (ICD-10-CM) - Cancer of tonsillar fossa (HCC)   THERAPY DIAG:  Abnormal posture  Malignant neoplasm of  tonsillar fossa (HCC)  Rationale for Evaluation and Treatment: Rehabilitation  ONSET DATE: 04/06/23  SUBJECTIVE:                                                                                                                                                                                           SUBJECTIVE STATEMENT: I am doing ok. I still can't eat because it hurts. The feeding tube it the best decision I made. I have  a burning feeling above my scar from where they cut the nerves. I have not had any swelling.   PERTINENT HISTORY:  SCC of the right tonsil, stage II (T2, N2, M0, p 16 +), presented to Dr. Jenne Pane on 02/07/23 for evaluation of an increasing right tonsillar mass that he had first noticed 1 month prior. Exam of the oropharynx revealed an enlarged and irregular right tonsil, 1 + left tonsil and a couple of enlarged right zone 2 lymph nodes. 02/07/23 Biopsy in office by Dr. Jenne Pane revealed SCC, p 16 +. Dr. Jenne Pane referred him to Dr. Hezzie Bump at [facility] to discuss surgical options. 02/27/23 Consult with Dr. Hezzie Bump and following discussion he had opted for surgery at that time but then expressed desire to learn about non surgical options. 03/06/23 Consult with Dr. Basilio Cairo. After this consult he then made the decision to proceed with surgery and his scheduled appointment with Dr. Al Pimple was cancelled. 03/12/23 PET showed the right tonsillar mass as tracer avid and concerning for malignancy; hypermetabolic right level 2 and level 3 cervical lymph nodes compatible with metastatic adenopathy; indeterminate tracer avid right upper lobe elongated area of increased soft tissue thickening within the medial right upper lobe (SUV max of 5.08); and nonspecific mild tracer avid left hilar lymph nodes. 04/06/23 He underwent TORS, right tonsillectomy and right neck dissection with Dr. Hezzie Bump. Pathology from the procedure revealed tumor the size of 2.7 cm; histology of nonkeratinizing squamous cell carcinoma with  focal maturation, HPV associated; positive for LVI and PNI; all final margins negative for carcinoma (including tongue base margin). Nodal status of 08/12 right level 2A, 3, and 4 lymph nodes positive for carcinoma; extranodal extension present; largest tumor deposit of 2.6 cm. Submandibular contents were also excised and showed no evidence of malignancy. He also underwent pharyngoplasty repair of the soft palate defect at the time of his procedure. Chemo/radiation were recommended after surgery and he saw Dr. Basilio Cairo on 04/27/23 for reconsult and Dr. Al Pimple on 05/01/23 for consult. Chemotherapy/Radiation are planned as recommended. He will receive 30 fractions of radiation to his Oropharynx and bilateral neck along with weekly cisplatin which started on 05/24/23 and will complete 07/05/23. 03/20/23 extractions of teeth #18 and #19; 04/06/23 TORS, right neck dissection; 05/28/23 PEG/PAC placed   PATIENT GOALS:  Reassess how my recovery is going related to neck ROM, cervical pain, fatigue, and swelling.  PAIN:  Are you having pain? Yes NPRS scale: 7/10 when swallowing food Pain location: throat Pain orientation: Right  PAIN TYPE: "it hurts" Pain description:  happens when swallowing   Aggravating factors: swallowing  Relieving factors: now swallowing food  PRECAUTIONS: Recent radiation, Head and neck lymphedema risk,    OBJECTIVE:   POSTURE:  Forward head and rounded shoulders posture   30 SEC SIT TO STAND: 07/23/23: Unable to assess due to orthostatic hypotension - doctors are aware   Eval: Unable today secondary to pain due to G tube placement   SHOULDER AROM:   [facility]     CERVICAL AROM:     Percent limited 07/23/23  Flexion [facility] WFL  Extension 50% limited with pulling at incision 25% limited  Right lateral flexion [facility] WFL  Left lateral flexion WFL 25% limited  Right rotation [facility] WFL  Left rotation [facility] WFL                          (Blank rows=not tested)    LYMPHEDEMA ASSESSMENT:     Circumference  in cm  4 cm superior to sternal notch around neck 40.5  6 cm superior to sternal notch around neck 41  8 cm superior to sternal notch around neck 40.5  R lateral nostril from base of nose to medial tragus   L lateral nostril from base of nose to medial tragus   R corner of mouth to where ear lobe meets face   L corner of mouth to where ear lobe meets face         (Blank rows=not tested)   OTHER SYMPTOMS: Pain Yes Fibrosis No Pitting edema No Infections No Decreased scar mobility No  PATIENT EDUCATION:  Education details: posture, walking program, neck ROM exercises, nerve desensitization techniques Person educated: Patient Education method: Explanation and Handouts Education comprehension: verbalized understanding  HOME EXERCISE PROGRAM: Reviewed previously given post op HEP. Nerve desensitization  ASSESSMENT:  CLINICAL IMPRESSION: Pt returns to PT after completing chemo and radiation for treatment of SCC of R tonsil. He underwent a R neck dissection on 04/06/23. He does not demonstrate any signs of lymphedema. He has great scar mobility. He is nearly at baseline ROM. He does present with tightness of R SCM muscle and reports he has been stretching and this has been improving. He was encouraged to continue his HEP over the next 6 months. Educated pt in nerve densensitization techniques since he has increased sensitivity of skin superior to his scar. Pt verbalized understanding. Pt will be discharged from skilled PT services at this time as he does not currently have any skilled needs. Educated pt to return if he develops any swelling in his neck.   Pt will benefit from skilled therapeutic intervention to improve on the following deficits: decreased knowledge of condition and decreased ROM  PT treatment/interventions: ADL/Self care home management, Therapeutic exercises, Therapeutic activity, Patient/Family education, and Self Care  GOALS: Goals reviewed with  patient? Yes  LONG TERM GOALS:  (STG=LTG) GOALS Name Target Date  Goal status  1 Pt will demonstrate a return to baseline cervical ROM measurements and not demonstrate any signs or symptoms of lymphedema. Baseline: 07/23/23  MET  2     3     4            PLAN:  PT FREQUENCY/DURATION: d/c this vist  PLAN FOR NEXT SESSION: d/c this visit   [facility]  [address], [address]  [place] [address]  [phone]   Scar massage You can begin gentle scar massage to you incision sites. Gently place one hand on the incision and move the skin (without sliding on the skin) in various directions. Do this for a few minutes and then you can gently massage either coconut oil or vitamin E cream into the scars.  Home exercise Program Continue doing the exercises you were given until you feel like you can do them without feeling any tightness at the end. It is best to do them for several months after completion of radiation since the effects of radiation continue past completion.   Walking Program Studies show that 30 minutes of walking per day (fast enough to elevate your heart rate) can significantly reduce the risk of a cancer recurrence. If you can't walk due to other medical reasons, we encourage you to find another activity you could do (like a stationary bike or water exercise).  Posture After treatment for head and neck cancer, people frequently sit with rounded shoulders and forward head posture because the front of the neck has  become tight and it feels better. If you sit like this, you can become very tight and have pain in sitting or standing with good posture. Try to be aware of your posture and sit and stand up tall to heal properly.  Follow up PT: Please let you doctor know as soon as possible if you develop any swelling in your face or neck in the future. Lymphedema (swelling) can occur months after completion of radiation. The sooner you can let the  doctor know, the sooner they can refer you back to PT. It is much easier to treat the swelling early on.   [facility], PT 07/23/2023, 9:51 AM    PHYSICAL THERAPY DISCHARGE SUMMARY  Visits from Start of Care: 2  Current functional level related to goals / functional outcomes: All goals met   Remaining deficits: None   Education / Equipment: HEP, lymphedema education    Patient agrees to discharge. Patient goals were met. Patient is being discharged due to meeting the stated rehab goals.  Milagros Loll [place], [place] 07/23/23 9:51 AM

## 2023-07-31 ENCOUNTER — Other Ambulatory Visit: Payer: Self-pay | Admitting: Adult Health

## 2023-07-31 ENCOUNTER — Telehealth: Payer: Self-pay | Admitting: Dietician

## 2023-07-31 ENCOUNTER — Inpatient Hospital Stay: Payer: Medicare Other | Attending: Hematology and Oncology | Admitting: Dietician

## 2023-07-31 DIAGNOSIS — G893 Neoplasm related pain (acute) (chronic): Secondary | ICD-10-CM

## 2023-07-31 DIAGNOSIS — C090 Malignant neoplasm of tonsillar fossa: Secondary | ICD-10-CM

## 2023-07-31 MED ORDER — OXYCODONE HCL 5 MG PO TABS
5.0000 mg | ORAL_TABLET | Freq: Four times a day (QID) | ORAL | 0 refills | Status: DC | PRN
Start: 2023-07-31 — End: 2023-09-18

## 2023-07-31 NOTE — Telephone Encounter (Signed)
Nutrition Follow-up:  Patient completed concurrent chemoradiation for SCC of right tonsil (final RT 7/11)  Spoke with patient via telephone. He reports physically feeling some better. He has mowed the yard a few times. He endorses significant throat pain with swallowing continues, however everyday is different. Yesterday, his throat did not hurt until he ate a few bites of dinner (pinto beans, fries, baked chicken). Today, pain is constant. Patient is using otc throat spray. This works better than lidocaine or hycet. He is doing baking soda salt water rinses before eating orally. Reports taste is slowly returning.   Patient continues to tolerate tube feedings well. Reports 4 [employer] cartons daily. Giving one carton every 3 hours. Sometimes he will do 5 cartons. This depends on if he has eaten.    Medications: reviewed   Labs: no new labs  Anthropometrics: Pt reports wt stable 180-183 lb on home scale   NUTRITION DIAGNOSIS: Predicted suboptimal intake - addressing with TF   INTERVENTION:  Continue 4-5 cartons [employer] 1.4 via tube Encouraged soft smooth foods as tolerated    MONITORING, EVALUATION, GOAL: weight trends, intake   NEXT VISIT: Wednesday August 14 via telephone

## 2023-08-08 ENCOUNTER — Telehealth: Payer: Self-pay | Admitting: Dietician

## 2023-08-08 ENCOUNTER — Inpatient Hospital Stay: Payer: Medicare Other | Admitting: Dietician

## 2023-08-08 ENCOUNTER — Other Ambulatory Visit: Payer: Self-pay

## 2023-08-08 NOTE — Telephone Encounter (Signed)
Nutrition Follow-up:  Patient completed concurrent chemoradiation for SCC of right tonsil (final RT 7/11)   Spoke with pt via telephone. He reports odynophagia persist, but pushing oral nutrition. Pt says he is eating breakfast and dinner meals orally. Recalls bowl of cereal or oatmeal in the morning. Taste is improving. Drinking chocolate Boost Plus which he likes. He tried a tomato last week. This tasted like a tomato but the acidity burned his throat. States meat that is fried taste rotten. He tried a grilled hamburger last night. This tasted bad. He is wondering if this is due to the top of his tongue turning black which is new in the last 4 days. Per pt, wife thinks this is thrush. He denies pain, unsure if tongue is swollen. Pt endorses increased energy. Bowels are moving daily. Pt has not been checking blood pressure regularly. Endorses one "dizzy spell" in the last few weeks.   Medications: reviewed   Labs: no new labs  Anthropometrics: No new wts    NUTRITION DIAGNOSIS: Predicted suboptimal intake improving   INTERVENTION:  Continue trying new foods in soft smooth textures Continue baking soda salt water rinses, encouraged before meals Continue Boost Plus/equivalent for added calories Continue 2 KF 1.4 via PEG as oral intake continues to increase  Secure chat to NP regarding tongue discoloration - pt has been contacted by nursing staff to further evaluate     MONITORING, EVALUATION, GOAL: weight trends, intake, TF   NEXT VISIT: Friday September 13 after MD

## 2023-08-10 NOTE — Telephone Encounter (Signed)
Entered in error

## 2023-08-13 ENCOUNTER — Ambulatory Visit: Payer: Medicare Other | Attending: Radiation Oncology

## 2023-08-13 DIAGNOSIS — R1310 Dysphagia, unspecified: Secondary | ICD-10-CM

## 2023-08-13 NOTE — Patient Instructions (Signed)
   Signs of Aspiration Pneumonia   Chest pain/tightness Fever (can be low grade) Cough  With foul-smelling phlegm (sputum) With sputum containing pus or blood With greenish sputum Fatigue  Shortness of breath  Wheezing   **IF YOU HAVE THESE SIGNS, CONTACT YOUR DOCTOR OR GO TO THE EMERGENCY DEPARTMENT OR URGENT CARE AS SOON AS POSSIBLE**     

## 2023-08-13 NOTE — Therapy (Signed)
OUTPATIENT SPEECH LANGUAGE PATHOLOGY ONCOLOGY EVALUATION   Patient Name: Ethan [name] MRN: [id number] DOB:12-15-1951, 72 y.o., male Today's Date: 08/13/2023  PCP: [name] REFERRING PROVIDER: Lonie Peak, MD  END OF SESSION:    Past Medical History:  Diagnosis Date   AAA (abdominal aortic aneurysm) (HCC)    Hyperlipidemia    Kidney stones    Renal disorder    Past Surgical History:  Procedure Laterality Date   ABDOMINAL AORTIC ANEURYSM REPAIR  06/15/2011   EVAR   CYSTOSCOPY WITH RETROGRADE PYELOGRAM, URETEROSCOPY AND STENT PLACEMENT Right 03/11/2015   Procedure: CYSTOSCOPY WITH RETROGRADE PYELOGRAM, AND RIGHT URETERAL STENT PLACEMENT;  Surgeon: Ethan Alvine, MD;  Location: [facility];  Service: Urology;  Laterality: Right;   HERNIA REPAIR     IR GASTROSTOMY TUBE MOD SED  05/28/2023   IR IMAGING GUIDED PORT INSERTION  05/28/2023   IR NASO G TUBE PLC W/FL-NO RAD  05/28/2023   LOWER BACK FUSION, L2-L3,AND L3-L4     RIGHT LOWER TIB-FEM FRACTURE     SHOULDER SURGERY Bilateral    SPINE SURGERY     THROAT SURGERY  04/06/2023   Patient Active Problem List   Diagnosis Date Noted   Port-A-Cath in place 05/29/2023   Cancer of tonsillar fossa (HCC) 03/07/2023   Arthralgia 08/29/2018   Weight loss 08/29/2018   Ureteral calculus, right 03/11/2015   Ureteral calculus 03/11/2015   Aftercare following surgery of the circulatory system, NEC 01/12/2014   Abdominal aneurysm without mention of rupture 07/08/2012    ONSET DATE: See "pertinent history" below   REFERRING DIAG: Cancer of tonsillar fossa  THERAPY DIAG:  Dysphagia, unspecified type  Rationale for Evaluation and Treatment: Rehabilitation  SUBJECTIVE:   SUBJECTIVE STATEMENT: Pt has tried to eat pizza and other foods like hamburger in the last week or so. Pt accompanied by: self  PERTINENT HISTORY:  SCC of the right tonsil, stage II. He presented to Dr. Jenne [name] on 02/07/23 for evaluation of an increasing  right tonsillar mass that he had first noticed 1 month prior. Exam of the oropharynx revealed an enlarged and irregular right tonsil, 1 + left tonsil and a couple of enlarged right zone 2 lymph nodes. 02/07/23 Biopsy in office by Dr. Jenne [name] revealed SCC, p 16 +. Dr. Jenne [name] referred him to Dr. Hezzie [name] at [facility] to discuss surgical options. 02/27/23 Consult with Dr. Hezzie [name] and following discussion he had opted for surgery at that time but then expressed desire to learn about non surgical options. 03/06/23 Consult with Dr. Basilio [name]. After this consult he then made the decision to proceed with surgery and his scheduled appointment with Dr. Al [name] was cancelled.03/12/23 PET showed the right tonsillar mass as tracer avid and concerning for malignancy; hypermetabolic right level 2 and level 3 cervical lymph nodes compatible with metastatic adenopathy; indeterminate tracer avid right upper lobe elongated area of increased soft tissue thickening within the medial right upper lobe (SUV max of 5.08); and nonspecific mild tracer avid left hilar lymph nodes. 04/06/23 He underwent TORS, right tonsillectomy and right neck dissection with Dr. Hezzie [name]. 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 Nodal status of 08/12 right level 2A, 3, and 4 lymph nodes positive for carcinoma; extranodal extension present; largest tumor deposit of 2.6 cm. Submandibular contents were also excised and showed no evidence of malignancy.  He also underwent pharyngoplasty repair of the soft palate defect at the time of his procedure. Unfortunately chemo/radiation were recommended after surgery and he saw Dr. Basilio [name] on 04/27/23 for reconsult and Dr. Al [name] on 05/01/23 for consult. Chemotherapy/Radiation are planned as recommended. Treatment plan:  He will  receive 30 fractions of radiation to his Oropharynx and bilateral neck along with weekly cisplatin which started on 05/24/23 and will complete 07/05/23.  PAIN:  Are you having pain? Yes: NPRS scale: 2/10 Pain location: throat Pain description: sore Aggravating factors: swallowing - goes to 7/10 Relieving factors: "getting it oiled up"  FALLS: Has patient fallen in last 6 months?  No  PATIENT GOALS: Maintain WNL swallowing  OBJECTIVE:    TODAY'S TREATMENT:                                                                                                                                         DATE:  08/13/23: Pt with dark coloring of his tongue - thinks maybe it is thrush, but not sure. Seeing dentist next week to consult. Pt ate applesauce and drank water today without any overt s/sx oral or pharyngeal deficits. SLP suggested, given pt's "s", that he steer towards softer foods for at least the next 2-4 weeks. Pt stated when he swallows foods, they are the consistency of applesauce. SLP provided information today about aspiration PNA and pt told SLP 3 s/sx.  With HEP pt was not doing scope of HEP as prescribed. SLP reiterated at LEAST 20 reps of exercises pt has to swallow. SLP req'd to provide model for pt but pt was successful after a model.   07/12/23: Pt doing [place] regularly. SLP encouraged one-two reps of each exercise instead of focus on just one. Pt demo'd understanding. He completed HEP with independence.  Ethan [name] drank sips of water without any overt s/sx oral or pharyngeal difficulties. He drinks water "all day".  SLP educated pt and wife about food journal, and pt told SLP rationale for HEP.   05/31/23 (Eval): Research states the risk for dysphagia increases due to radiation and/or chemotherapy treatment due to a variety of factors, so SLP educated the pt about the possibility of reduced/limited ability for PO intake during rad tx. SLP also educated pt regarding possible changes to  swallowing musculature after rad tx, and why adherence to dysphagia HEP provided today and PO consumption was necessary to inhibit muscle fibrosis following rad tx and to mitigate muscle disuse atrophy. SLP informed pt why this would be detrimental to their swallowing status and to their pulmonary health. Pt demonstrated understanding of these things to SLP. SLP encouraged pt to safely eat and drink as deep into their radiation/chemotherapy as possible to provide the best possible long-term swallowing outcome for pt.  SLP then developed an individualized HEP for pt involving oral and pharyngeal strengthening and ROM and pt was instructed how to perform these  exercises, including SLP demonstration. After SLP demonstration, pt return demonstrated each exercise. SLP ensured pt performance was correct prior to educating pt on next exercise. Pt required occasional min-mod cues faded to modified independent to perform HEP. Pt was instructed to complete this program 6-7 days/week, at least 2 times a day until 6 months after his or her last day of rad tx, and then x2 a week after that, indefinitely. Among other modifications for days when pt cannot functionally swallow, SLP also suggested pt to perform only non-swallowing tasks on the handout/HEP, and if necessary to cycle through the swallowing portion so the full program of exercises can be completed instead of fatiguing on one of the swallowing exercises and being unable to perform the other swallowing exercises. SLP instructed that swallowing exercises should then be added back into the regimen as pt is able to do so.   PATIENT EDUCATION: Education details: late effects head/neck radiation on swallow function and HEP procedure, and see "today's treatment" for details Person educated: Patient Education method: Explanation, Demonstration, Verbal cues, and Handouts Education comprehension: verbalized understanding, returned demonstration, verbal cues required, and  needs further education   ASSESSMENT:  CLINICAL IMPRESSION: Patient is a 72 y.o. M who was seen today for treatment of swallowing following chemoradiation therapy (last day of rad was 07/05/23). Today pt drank thin liquids and ate applesauce without overt s/s oral or pharyngeal difficulty (pt politely refused fig bar and cereal bar). At this time pt swallowing is deemed WNL/WFL with these POs. There are no overt s/s aspiration PNA observed by SLP nor any reported by pt at this time. Data indicate that pt's swallow ability will likely decrease over the course of radiation/chemoradiation therapy and could very well decline over time following the conclusion of that therapy due to muscle disuse atrophy and/or muscle fibrosis. Pt will cont to need to be seen by SLP in order to assess safety of PO intake, assess the need for recommending any objective swallow assessment, and ensuring pt is correctly completing the individualized HEP.  OBJECTIVE IMPAIRMENTS: include dysphagia. These impairments are limiting patient from safety when swallowing. Factors affecting potential to achieve goals and functional outcome are  none noted . Patient will benefit from skilled SLP services to address above impairments and improve overall function.  REHAB POTENTIAL: Good  SHORT TERM GOALS: Target: 3rd total session   Pt will complete HEP with modified independence in 2 sessions Baseline: 07/12/23 Goal status: Partially met   2.  pt will tell SLP why pt is completing HEP with modified independence Baseline:  Goal status: Met   3.  pt will describe 3 overt s/s aspiration PNA with modified independence Baseline:  Goal status: Met   4.  pt will tell SLP how a food journal could hasten return to a more normalized diet Baseline:  Goal status: Met     LONG TERM GOALS: Target: 7th total session   1.  pt will complete HEP with independence over two visits Baseline:  Goal status: Initial   2.  pt will describe  how to modify HEP over time, and the timeline associated with reduction in HEP frequency with modified independence over two sessions Baseline:  Goal status: Initial   PLAN:   SLP FREQUENCY:  once approx every 4 weeks   SLP DURATION:  6 sessions   PLANNED INTERVENTIONS: Aspiration precaution training, Pharyngeal strengthening exercises, Diet toleration management , Environmental controls, Trials of upgraded texture/liquids, Cueing hierachy, Internal/external aids, SLP instruction and feedback,  Compensatory strategies, and Patient/family education    [facility], CCC-SLP 08/13/2023, 9:25 AM

## 2023-09-07 ENCOUNTER — Inpatient Hospital Stay: Payer: Medicare Other | Attending: Hematology and Oncology

## 2023-09-07 ENCOUNTER — Inpatient Hospital Stay (HOSPITAL_BASED_OUTPATIENT_CLINIC_OR_DEPARTMENT_OTHER): Payer: Medicare Other | Admitting: Hematology and Oncology

## 2023-09-07 ENCOUNTER — Inpatient Hospital Stay: Payer: Medicare Other | Admitting: Dietician

## 2023-09-07 VITALS — BP 101/60 | HR 75 | Temp 97.6°F | Resp 21 | Wt 178.7 lb

## 2023-09-07 DIAGNOSIS — K1230 Oral mucositis (ulcerative), unspecified: Secondary | ICD-10-CM | POA: Diagnosis not present

## 2023-09-07 DIAGNOSIS — Z923 Personal history of irradiation: Secondary | ICD-10-CM | POA: Insufficient documentation

## 2023-09-07 DIAGNOSIS — H9191 Unspecified hearing loss, right ear: Secondary | ICD-10-CM | POA: Insufficient documentation

## 2023-09-07 DIAGNOSIS — Z9221 Personal history of antineoplastic chemotherapy: Secondary | ICD-10-CM | POA: Diagnosis not present

## 2023-09-07 DIAGNOSIS — Z95828 Presence of other vascular implants and grafts: Secondary | ICD-10-CM

## 2023-09-07 DIAGNOSIS — C090 Malignant neoplasm of tonsillar fossa: Secondary | ICD-10-CM | POA: Diagnosis present

## 2023-09-07 DIAGNOSIS — G893 Neoplasm related pain (acute) (chronic): Secondary | ICD-10-CM | POA: Insufficient documentation

## 2023-09-07 DIAGNOSIS — Z79891 Long term (current) use of opiate analgesic: Secondary | ICD-10-CM | POA: Insufficient documentation

## 2023-09-07 DIAGNOSIS — R220 Localized swelling, mass and lump, head: Secondary | ICD-10-CM | POA: Diagnosis not present

## 2023-09-07 LAB — CMP (CANCER CENTER ONLY)
ALT: 8 U/L (ref 0–44)
AST: 15 U/L (ref 15–41)
Albumin: 3.8 g/dL (ref 3.5–5.0)
Alkaline Phosphatase: 48 U/L (ref 38–126)
Anion gap: 5 (ref 5–15)
BUN: 21 mg/dL (ref 8–23)
CO2: 28 mmol/L (ref 22–32)
Calcium: 9.1 mg/dL (ref 8.9–10.3)
Chloride: 105 mmol/L (ref 98–111)
Creatinine: 1.11 mg/dL (ref 0.61–1.24)
GFR, Estimated: >60 mL/min (ref >60)
Glucose, Bld: 87 mg/dL (ref 70–99)
Potassium: 4 mmol/L (ref 3.5–5.1)
Sodium: 138 mmol/L (ref 135–145)
Total Bilirubin: 0.6 mg/dL (ref 0.3–1.2)
Total Protein: 6.2 g/dL — ABNORMAL LOW (ref 6.5–8.1)

## 2023-09-07 LAB — CBC WITH DIFFERENTIAL (CANCER CENTER ONLY)
Abs Immature Granulocytes: 0.02 10*3/uL (ref 0.00–0.07)
Basophils Absolute: 0 10*3/uL (ref 0.0–0.1)
Basophils Relative: 1 %
Eosinophils Absolute: 0.2 10*3/uL (ref 0.0–0.5)
Eosinophils Relative: 3 %
HCT: 37.7 % — ABNORMAL LOW (ref 39.0–52.0)
Hemoglobin: 13.3 g/dL (ref 13.0–17.0)
Immature Granulocytes: 0 %
Lymphocytes Relative: 7 %
Lymphs Abs: 0.4 10*3/uL — ABNORMAL LOW (ref 0.7–4.0)
MCH: 36.1 pg — ABNORMAL HIGH (ref 26.0–34.0)
MCHC: 35.3 g/dL (ref 30.0–36.0)
MCV: 102.4 fL — ABNORMAL HIGH (ref 80.0–100.0)
Monocytes Absolute: 0.4 10*3/uL (ref 0.1–1.0)
Monocytes Relative: 8 %
Neutro Abs: 4.2 10*3/uL (ref 1.7–7.7)
Neutrophils Relative %: 81 %
Platelet Count: 141 10*3/uL — ABNORMAL LOW (ref 150–400)
RBC: 3.68 MIL/uL — ABNORMAL LOW (ref 4.22–5.81)
RDW: 12.1 % (ref 11.5–15.5)
WBC Count: 5.2 10*3/uL (ref 4.0–10.5)
nRBC: 0 % (ref 0.0–0.2)

## 2023-09-07 NOTE — Progress Notes (Deleted)
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 We have discussed about adjuvant chemoradiation when we last met him. Recently we had a conversation that the RUL nodule is persistent and cavitary and hence is of concern that it could be malignant but Dr Basilio Cairo feels it could be primary lung and slow growing overall, so we should proceed with Chemoradiation and follow up on lung nodule especially since there is no significant growth on the most recent scans. He is now here before fifth planned weekly cycle of cisplatin. He is doing well overall except for a recent ED visit to near syncope.  Other than the near-syncope, he has been able to maintain his weight.  No change in hearing or neuropathy reported.  His GFR has dropped a little hence we will dose adjust the cisplatin down by 25%.  With regards to the near syncope and possible dehydration, we will arrange for fluids once a week other than the fluids he will receive during each  treatment.  He prefers this to be on Fridays.  He will return to clinic next week for toxicity check again.  HISTORY OF PRESENTING ILLNESS:  Ethan [name] 72 y.o. male is here because of SCC tongue, P16 positive.  This is a very pleasant 72 year old male patient with past medical history significant for abdominal aortic aneurysm status postrepair now diagnosed with squamous cell carcinoma of the base of the tongue at diagnosis had T1 N1 M0 status post definitive surgery had radical tonsillectomy right neck lymph node dissection with final pathology showing tumor size of 2.7 cm nonkeratinizing squamous cell carcinoma with focal maturation, HPV associated, lymphovascular invasion and perineural invasion identified, negative surgical margins, 8 out of 12 lymph nodes with carcinoma and the largest tumor deposit was 2.6 cm in greatest dimension extranodal extension present referred to medical oncology for adjuvant chemotherapy.  At baseline he is extremely healthy, very active, eats healthy and denies any major medical issues.  Initially when he saw Dr. Basilio Cairo before surgery, he was discussed the option for concurrent chemoradiation versus surgery but he chose to proceed with surgery. When I last saw him, we discussed about concurrent CRT. He also has a slow growing RUL which is likely a second primary lung malignancy. Group discussion was to continue with planned chemo RT and evaluate this RUL nodule after completing adjuvant chemotherapy.  He is here after his fourth week of chemotherapy. Since his last visit, he had a near syncopal episode and went to ED. He required some fluids and he went home. He otherwise also remains constipated and wondering if he can take Miralax. He is maintaining his weight. He denies any  tingling or numbness. No neuropathy reported. Rest of the pertinent 10 point ROS reviewed and neg.  Wt Readings from Last 3 Encounters:  09/07/23 178 lb 11.2 oz (81.1 kg)  07/19/23 183 lb  (83 kg)  07/17/23 181 lb 12.8 oz (82.5 kg)     MEDICAL HISTORY:  Past Medical History:  Diagnosis Date   AAA (abdominal aortic aneurysm) (HCC)    Hyperlipidemia    Kidney stones    Renal disorder     SURGICAL HISTORY: Past Surgical History:  Procedure Laterality Date   ABDOMINAL AORTIC ANEURYSM REPAIR  06/15/2011   EVAR   CYSTOSCOPY WITH RETROGRADE PYELOGRAM, URETEROSCOPY AND STENT PLACEMENT Right 03/11/2015   Procedure: CYSTOSCOPY WITH RETROGRADE PYELOGRAM, AND RIGHT URETERAL STENT PLACEMENT;  Surgeon: Barron Alvine, MD;  Location: [facility];  Service: Urology;  Laterality: Right;   HERNIA REPAIR     IR GASTROSTOMY TUBE MOD SED  05/28/2023   IR IMAGING GUIDED PORT INSERTION  05/28/2023   IR NASO G TUBE PLC W/FL-NO RAD  05/28/2023   LOWER BACK FUSION, L2-L3,AND L3-L4     RIGHT LOWER TIB-FEM FRACTURE     SHOULDER SURGERY Bilateral    SPINE SURGERY     THROAT SURGERY  04/06/2023    SOCIAL HISTORY: Social History   Socioeconomic History   Marital status: Married    Spouse name: Not on file   Number of children: Not on file   Years of education: Not on file   Highest education level: Not on file  Occupational History   Not on file  Tobacco Use   Smoking status: Some Days    Current packs/day: 0.50    Types: Cigarettes   Smokeless tobacco: Never  Vaping Use   Vaping status: Never Used  Substance and Sexual Activity   Alcohol use: Yes    Alcohol/week: 0.0 standard drinks of alcohol    Comment: occ   Drug use: No   Sexual activity: Not Currently  Other Topics Concern   Not on file  Social History Narrative   Not on file   Social Determinants of Health   Financial Resource Strain: Not on file  Food Insecurity: No Food Insecurity (07/17/2023)   Hunger Vital Sign    Worried About Running Out of Food in the Last Year: Never true    Ran Out of Food in the Last Year: Never true  Transportation Needs: No Transportation Needs (07/17/2023)   PRAPARE - Doctor, general practice (Medical): No    Lack of Transportation (Non-Medical): No  Physical Activity: Not on file  Stress: Not on file  Social Connections: Not on file  Intimate Partner Violence: Not At Risk (07/17/2023)   Humiliation, Afraid, Rape, and Kick questionnaire    Fear of Current or Ex-Partner: No    Emotionally Abused: No    Physically Abused: No    Sexually Abused: No    FAMILY HISTORY: Family History  Problem Relation Age of Onset   Aneurysm Father    Stroke Mother    Heart disease Mother        Aneurysm   Hypertension Sister    Alcohol abuse Brother    Cancer Brother        mouth cancer   Aneurysm Paternal Uncle     ALLERGIES:  has No Known Allergies.  MEDICATIONS:  Current Outpatient Medications  Medication Sig Dispense Refill   augmented betamethasone dipropionate (DIPROLENE-AF) 0.05 % cream APPLY TO RASH ON THE SKIN  TWICE DAILY FOR 2 3 WEEKS.     lidocaine (XYLOCAINE) 2 % solution Use as directed 15 mLs in the mouth or throat as needed for mouth pain. 100 mL 0   Nutritional Supplements ([employer] STANDARD 1.4) LIQD [employer] 1.4 - Give one (325 ml) carton via PEG 5x/day. Flush tube with 60 ml water before and after each bolus. Drink by mouth or give via tube additional 711 ml (3 cups) water daily. This provides 2275 kcal, 100 grams protein, 1154 ml free water from formula (2345 ml total water with flushes). Meets 100% RDI     oxyCODONE (OXY IR/ROXICODONE) 5 MG immediate release tablet Take 1 tablet (5 mg total) by mouth every 6 (six) hours as needed for severe pain. 30 tablet 0   sulfamethoxazole-trimethoprim (BACTRIM DS) 800-160 MG tablet Take 1 tablet by mouth 2 (two) times daily. 14 tablet 0   tamsulosin (FLOMAX) 0.4 MG CAPS capsule Take 2 capsules (0.8 mg total) by mouth daily after breakfast. 60 capsule 0   No current facility-administered medications for this visit.     PHYSICAL EXAMINATION: ECOG PERFORMANCE STATUS: 0 - Asymptomatic  Vitals:    09/07/23 0845  BP: 101/60  Pulse: 75  Resp: (!) 21  Temp: 97.6 F (36.4 C)  SpO2: 100%    Filed Weights   09/07/23 0845  Weight: 178 lb 11.2 oz (81.1 kg)    Physical Exam Constitutional:      Appearance: Normal appearance.  Cardiovascular:     Rate and Rhythm: Normal rate and regular rhythm.     Pulses: Normal pulses.     Heart sounds: Normal heart sounds.  Pulmonary:     Effort: Pulmonary effort is normal.     Breath sounds: Normal breath sounds.  Musculoskeletal:        General: Normal range of motion.     Cervical back: Normal range of motion. No rigidity.  Lymphadenopathy:     Cervical: No cervical adenopathy.  Skin:    General: Skin is warm and dry.  Neurological:     General: No focal deficit present.     Mental Status: He is alert.      LABORATORY DATA:  I have reviewed the data as listed Lab Results  Component Value Date   WBC 5.2 09/07/2023   HGB 13.3 09/07/2023   HCT 37.7 (L) 09/07/2023   MCV 102.4 (H) 09/07/2023   PLT 141 (L) 09/07/2023     Chemistry      Component Value Date/Time   NA 136 07/16/2023 0817   K 4.0 07/16/2023 0817   CL 103 07/16/2023 0817   CO2 28 07/16/2023 0817   BUN 22 07/16/2023 0817   CREATININE 0.94 07/16/2023 0817      Component Value Date/Time   CALCIUM 9.0 07/16/2023 0817   ALKPHOS 63 03/06/2023 1255   AST 13 (L) 03/06/2023 1255   ALT 8 03/06/2023 1255   BILITOT 0.5 03/06/2023 1255       RADIOGRAPHIC STUDIES: I have personally reviewed the radiological images as listed and agreed with the findings in the report. No results found.  All questions were answered. The patient knows to call the clinic with any problems, questions or concerns.  I spent 30 minutes in the care of this patientincluding H and P, review of records, counseling and coordination of care.     Rachel Moulds, MD 09/07/2023 9:00 AM

## 2023-09-07 NOTE — Progress Notes (Signed)
Patient Care Team: Practice, [facility] as PCP - General Lonie Peak, MD as Attending Physician (Radiation Oncology) Malmfelt, Lise Auer, RN as Registered Nurse Rachel Moulds, MD as Consulting Physician (Hematology and Oncology) Corey Skains, MD as Referring Physician (Otolaryngology) Noreene Larsson, RD as Dietitian (Dietician) Richrd Humbles (Dentistry) Christia Reading, MD as Consulting Physician (Otolaryngology) Alfredo Martinez, MD as Consulting Physician (Urology)  DIAGNOSIS:  Encounter Diagnosis  Name Primary?   Cancer of tonsillar fossa (HCC) Yes    SUMMARY OF ONCOLOGIC HISTORY: Oncology History  Cancer of tonsillar fossa (HCC)  02/07/2023 Initial Biopsy   Biopsy of the right tonsil  revealed: squamous cell carcinoma, p16 positive.    03/12/2023 PET scan   Right tonsillar mass as tracer avid and concerning for malignancy; hypermetabolic right level 2 and level 3 cervical lymph nodes compatible with metastatic adenopathy; indeterminate tracer avid right upper lobe elongated area of increased soft tissue thickening within the medial right upper lobe (SUV max of 5.08); and nonspecific mild tracer avid left hilar lymph nodes.    03/20/2023 Miscellaneous   Dentistry evaluation: Extractions of #18 and #19 with Dr. Azucena Cecil   04/06/2023 Surgery   TORS resection (Dr. Hezzie Bump): 2.7 cm tumor, histology of nonkeratinizing squamous cell carcinoma with focal maturation, HPV associated; + LVI and PNI; all margins negative.  8/12 + LN including right level 2A, 3, and 4 being +.  Extranodal extension present, largest tumor deposit 2.6cm.     04/27/2023 Cancer Staging   Staging form: Pharynx - HPV-Mediated Oropharynx, AJCC 8th Edition - Pathologic stage from 04/27/2023: Stage II (pT2, pN2, cM0, p16+) - Signed by Lonie Peak, MD on 04/27/2023 Stage prefix: Initial diagnosis   05/22/2023 - 06/26/2023 Chemotherapy   Patient is on Treatment Plan : HEAD/NECK Cisplatin (40) q7d      05/23/2023 - 07/04/2023 Radiation Therapy   Concurrent chemoradiation     Discussed the use of AI scribe software for clinical note transcription with the patient, who gave verbal consent to proceed.  History of Present Illness   He reports experiencing hearing loss, describing it as an "echoish" sensation, similar to the feeling of ears popping in high altitude. The hearing loss is more pronounced in the right ear, and he also reports soreness radiating up into the ear. The patient also reports facial swelling, particularly noticeable in the mornings, which he believes may be related to the hearing loss.  In addition to the hearing loss, the patient is experiencing difficulty swallowing. He describes a soreness on the right side of his throat that intensifies with each swallow, eventually becoming so painful that he is unable to continue eating. Despite this, he has been able to maintain his weight, although he has not been able to gain weight. The patient also reports a thickening of his saliva.  The patient is currently using a stomach tube for some of his nutritional needs, and he is having difficulty getting all of his supplements through the tube. He is also taking oxycodone as needed for pain, typically once at night, particularly after days when he has done a lot of talking and his throat becomes sore.        ALLERGIES:  has No Known Allergies.  MEDICATIONS:  Current Outpatient Medications  Medication Sig Dispense Refill   augmented betamethasone dipropionate (DIPROLENE-AF) 0.05 % cream APPLY TO RASH ON THE SKIN TWICE DAILY FOR 2 3 WEEKS.     lidocaine (XYLOCAINE) 2 % solution Use as directed 15 mLs in  the mouth or throat as needed for mouth pain. 100 mL 0   Nutritional Supplements (KATE FARMS STANDARD 1.4) LIQD [employer] 1.4 - Give one (325 ml) carton via PEG 5x/day. Flush tube with 60 ml water before and after each bolus. Drink by mouth or give via tube additional 711 ml (3 cups)  water daily. This provides 2275 kcal, 100 grams protein, 1154 ml free water from formula (2345 ml total water with flushes). Meets 100% RDI     oxyCODONE (OXY IR/ROXICODONE) 5 MG immediate release tablet Take 1 tablet (5 mg total) by mouth every 6 (six) hours as needed for severe pain. 30 tablet 0   sulfamethoxazole-trimethoprim (BACTRIM DS) 800-160 MG tablet Take 1 tablet by mouth 2 (two) times daily. 14 tablet 0   tamsulosin (FLOMAX) 0.4 MG CAPS capsule Take 2 capsules (0.8 mg total) by mouth daily after breakfast. 60 capsule 0   No current facility-administered medications for this visit.    PHYSICAL EXAMINATION: ECOG PERFORMANCE STATUS: 1 - Symptomatic but completely ambulatory  Vitals:   09/07/23 0845  BP: 101/60  Pulse: 75  Resp: (!) 21  Temp: 97.6 F (36.4 C)  SpO2: 100%   Filed Weights   09/07/23 0845  Weight: 178 lb 11.2 oz (81.1 kg)    Physical Exam   HEENT: Mucositis noted on the right side of the oral cavity. NECK: Swelling on the right side. CARDIOVASCULAR: RRR No LE edema Abdomen: G tube site appears well.       LABORATORY DATA:  I have reviewed the data as listed    Latest Ref Rng & Units 09/07/2023    8:18 AM 07/16/2023    8:17 AM 07/09/2023    8:40 AM  CMP  Glucose 70 - 99 mg/dL 87  83  161   BUN 8 - 23 mg/dL 21  22  27    Creatinine 0.61 - 1.24 mg/dL 0.96  0.45  4.09   Sodium 135 - 145 mmol/L 138  136  133   Potassium 3.5 - 5.1 mmol/L 4.0  4.0  4.1   Chloride 98 - 111 mmol/L 105  103  101   CO2 22 - 32 mmol/L 28  28  25    Calcium 8.9 - 10.3 mg/dL 9.1  9.0  8.9   Total Protein 6.5 - 8.1 g/dL 6.2     Total Bilirubin 0.3 - 1.2 mg/dL 0.6     Alkaline Phos 38 - 126 U/L 48     AST 15 - 41 U/L 15     ALT 0 - 44 U/L 8       Lab Results  Component Value Date   WBC 5.2 09/07/2023   HGB 13.3 09/07/2023   HCT 37.7 (L) 09/07/2023   MCV 102.4 (H) 09/07/2023   PLT 141 (L) 09/07/2023   NEUTROABS 4.2 09/07/2023    ASSESSMENT & PLAN:  Cancer of tonsillar  fossa (HCC) Ethan [name] is a 72 year old man with 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  Patient reports hearing changes, difficulty swallowing, and facial swelling 8 weeks post-radiation. Noted mucositis on exam in the right side of the oral cavity. -Expect improvement over the next month. -If hearing does not improve, refer to ENT for evaluation. -Continue monitoring and supportive care for swallowing difficulty and facial swelling. -Schedule CT scan in 4 weeks to assess treatment response.  Pain management Patient reports using Oxycodone  as needed, primarily at night. -Refill Oxycodone prescription (30 pills) with the goal of tapering off over the next couple of months. -Encourage use of Tylenol for mild pain.  Weight maintenance Patient reports difficulty gaining weight but is maintaining current weight. -Continue current dietary practices. -Encourage gradual increase in food intake as swallowing improves.  CBC today shows improvement in WBC, Hb, platelets mildy low. Follow-up Plan to see patient in 3 months.        No orders of the defined types were placed in this encounter.  The patient has a good understanding of the overall plan. he agrees with it. he will call with any problems that may develop before the next visit here. Total time spent: 30 mins including face to face time and time spent for planning, charting and co-ordination of care   Rachel Moulds, MD 09/07/23

## 2023-09-07 NOTE — Progress Notes (Signed)
Nutrition Follow-up:  Patient completed concurrent chemoradiation for SCC of right tonsil (final RT 7/11)   Met with patient and wife in office following med onc visit. He reports daily changes in swallowing, dry mouth, ropy saliva, sore throat. He tolerates small amounts of po before swallowing becomes too painful. Recently pt recalls fried flounder, grits, oatmeal, soft boiled egg with gravy, chimichanga. Pt is supplementing with TF, reports 3-4 cartons of [employer]/day. If unable to do a feeding, pt will drink a chocolate Boost.    Medications: reviewed   Labs: reviewed   Anthropometrics: Wt 178 lb 11.2 oz today   7/23 - 181 lb 12.8 oz  7/10 - 180 lb 1.6 oz    NUTRITION DIAGNOSIS: Predicted suboptimal intake improving - pt supplementing with TF   INTERVENTION:  Continue trying new foods in soft smooth textures Continue Boost Plus/equivalent for added calories Continue 3-4 KF 1.4 via PEG as oral intake continues to increase    MONITORING, EVALUATION, GOAL: wt trends, po, TF   NEXT VISIT: Tuesday October 15 after MD

## 2023-09-07 NOTE — Assessment & Plan Note (Addendum)
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  Patient reports hearing changes, difficulty swallowing, and facial swelling 8 weeks post-radiation. Noted mucositis on exam in the right side of the oral cavity. -Expect improvement over the next month. -If hearing does not improve, refer to ENT for evaluation. -Continue monitoring and supportive care for swallowing difficulty and facial swelling. -Schedule CT scan in 4 weeks to assess treatment response.  Pain management Patient reports using Oxycodone as needed, primarily at night. -Refill Oxycodone prescription (30 pills) with the goal of tapering off over the next couple of months. -Encourage use of Tylenol for mild pain.  Weight maintenance Patient reports difficulty gaining weight but is maintaining current weight. -Continue current dietary practices. -Encourage gradual increase in food intake as swallowing improves.  CBC today shows improvement in WBC, Hb, platelets mildy low. Follow-up Plan to see patient in 3 months.

## 2023-09-10 ENCOUNTER — Telehealth: Payer: Self-pay | Admitting: *Deleted

## 2023-09-10 NOTE — Telephone Encounter (Signed)
CALLED PATIENT TO INFORM OF CT FOR 10-04-23- ARRIVAL TIME- 12:15 PM @ [facility], NO RESTRICTIONS TO SCAN, PATIENT TO RECEIVE RESULTS FROM DR. SQUIRE ON 10-09-23 @ 2:40 PM, LVM FOR A RETURN CALL

## 2023-09-11 ENCOUNTER — Ambulatory Visit: Payer: Medicare Other

## 2023-09-18 ENCOUNTER — Other Ambulatory Visit: Payer: Self-pay | Admitting: Hematology and Oncology

## 2023-09-18 DIAGNOSIS — G893 Neoplasm related pain (acute) (chronic): Secondary | ICD-10-CM

## 2023-09-18 DIAGNOSIS — C090 Malignant neoplasm of tonsillar fossa: Secondary | ICD-10-CM

## 2023-09-18 MED ORDER — OXYCODONE HCL 5 MG PO TABS
5.0000 mg | ORAL_TABLET | Freq: Four times a day (QID) | ORAL | 0 refills | Status: DC | PRN
Start: 2023-09-18 — End: 2024-02-20

## 2023-09-25 NOTE — Progress Notes (Incomplete)
Mr. Ethan [name] presents today for follow up for completion of radiation therapy for right tonsil and right neck (lymph nodes). He completed radiation on 07-05-23. Pt will receive Ct results from 10-04-23.   CT Chest W Contrast 10/04/2023  IMPRESSION: 1. The previously demonstrated tubular right upper lobe pulmonary nodule has decreased in size and no longer demonstrates central cavitation. Continued attention on follow-up recommended. 2. No new or enlarging pulmonary nodules. 3. No evidence of metastatic disease in the chest. 4. Stable mild dilatation of the ascending aorta. 5. Neck findings dictated separately. 6. Aortic Atherosclerosis (ICD10-I70.0) and Emphysema (ICD10-J43.  CT Soft Tissue Neck W Contrast 10/04/2023  IMPRESSION: 1. No evidence of residual or recurrent right tonsillar mass. Probable mild post radiation edema of the mucosa and submucosal tissues of the region of the pharynx. 2. No lymphadenopathy on either side of the neck. Previously seen abnormal nodes on the right have been successfully treated on the basis of this CT. 3. Emphysema and scarring in the lung apices. See results of chest CT relating to a medial right upper lobe lesion. 4. Aortic atherosclerosis. 5. Small mastoid effusion on the right.   Aortic Atherosclerosis (ICD10-I70.0) and Emphysema (ICD10-J43.9).  Pain issues, if any: no pain to report Using a feeding tube?: just flushes Weight changes, if any:  Wt Readings from Last 3 Encounters:  10/09/23 177 lb 6 oz (80.5 kg)  09/07/23 178 lb 11.2 oz (81.1 kg)  07/19/23 183 lb (83 kg)    Swallowing issues, if any: doing well though spicy food does burn Smoking or chewing tobacco? Still smoke cigarettes  Using fluoride toothpaste daily? none Last ENT visit was on: not since treatment Other notable issues, if any: Still having several side effects including thick mucus and dry mouth as well. Pt passed out at football game on Saturday. Wants to know when  feeding tube can come out. Wants to know test results.   Vitals:   10/09/23 1434  BP: 125/66  Pulse: 71  Resp: 18  Temp: (!) 97.1 F (36.2 C)  SpO2: 100%

## 2023-10-04 ENCOUNTER — Ambulatory Visit (HOSPITAL_COMMUNITY)
Admission: RE | Admit: 2023-10-04 | Discharge: 2023-10-04 | Disposition: A | Discharge status: Home / Self Care | Payer: Medicare Other | Source: Ambulatory Visit | Attending: Radiation Oncology | Admitting: Radiation Oncology

## 2023-10-04 ENCOUNTER — Encounter (HOSPITAL_COMMUNITY): Payer: Self-pay

## 2023-10-04 DIAGNOSIS — C090 Malignant neoplasm of tonsillar fossa: Secondary | ICD-10-CM | POA: Diagnosis present

## 2023-10-04 MED ORDER — IOHEXOL 300 MG/ML  SOLN
100.0000 mL | Freq: Once | INTRAMUSCULAR | Status: AC | PRN
  Administered 2023-10-04: 100 mL via INTRAVENOUS

## 2023-10-04 MED ORDER — SODIUM CHLORIDE (PF) 0.9 % IJ SOLN
INTRAMUSCULAR | Status: AC
  Filled 2023-10-04: qty 50

## 2023-10-09 ENCOUNTER — Inpatient Hospital Stay: Payer: Medicare Other | Attending: Hematology and Oncology | Admitting: Dietician

## 2023-10-09 ENCOUNTER — Encounter: Payer: Self-pay | Admitting: Radiation Oncology

## 2023-10-09 ENCOUNTER — Ambulatory Visit
Admission: RE | Admit: 2023-10-09 | Discharge: 2023-10-09 | Disposition: A | Discharge status: Home / Self Care | Payer: Medicare Other | Source: Ambulatory Visit | Attending: Radiation Oncology | Admitting: Radiation Oncology

## 2023-10-09 VITALS — BP 125/66 | HR 71 | Temp 97.1°F | Resp 18 | Ht 75.0 in | Wt 177.4 lb

## 2023-10-09 DIAGNOSIS — I700 Atherosclerosis of aorta: Secondary | ICD-10-CM | POA: Insufficient documentation

## 2023-10-09 DIAGNOSIS — R911 Solitary pulmonary nodule: Secondary | ICD-10-CM | POA: Insufficient documentation

## 2023-10-09 DIAGNOSIS — J432 Centrilobular emphysema: Secondary | ICD-10-CM | POA: Insufficient documentation

## 2023-10-09 DIAGNOSIS — C090 Malignant neoplasm of tonsillar fossa: Secondary | ICD-10-CM | POA: Diagnosis present

## 2023-10-09 DIAGNOSIS — F17210 Nicotine dependence, cigarettes, uncomplicated: Secondary | ICD-10-CM | POA: Insufficient documentation

## 2023-10-09 DIAGNOSIS — K7689 Other specified diseases of liver: Secondary | ICD-10-CM | POA: Insufficient documentation

## 2023-10-09 DIAGNOSIS — I3139 Other pericardial effusion (noninflammatory): Secondary | ICD-10-CM | POA: Diagnosis not present

## 2023-10-09 DIAGNOSIS — Z923 Personal history of irradiation: Secondary | ICD-10-CM | POA: Insufficient documentation

## 2023-10-09 DIAGNOSIS — J90 Pleural effusion, not elsewhere classified: Secondary | ICD-10-CM | POA: Diagnosis not present

## 2023-10-09 DIAGNOSIS — I714 Abdominal aortic aneurysm, without rupture, unspecified: Secondary | ICD-10-CM | POA: Diagnosis not present

## 2023-10-09 DIAGNOSIS — M47812 Spondylosis without myelopathy or radiculopathy, cervical region: Secondary | ICD-10-CM | POA: Diagnosis not present

## 2023-10-09 DIAGNOSIS — R634 Abnormal weight loss: Secondary | ICD-10-CM

## 2023-10-09 NOTE — Progress Notes (Signed)
Radiation Oncology         [phone] ________________________________  Name: Ethan [name] MRN: [id number]  Date: 10/09/2023  DOB: 04-Sep-1951  Follow-Up Visit Note  CC: Practice, [facility]  Corey Skains, MD  Diagnosis and Prior Radiotherapy:       ICD-10-CM   1. Cancer of tonsillar fossa (HCC)  C09.0 CT Chest W Contrast    CT Soft Tissue Neck W Contrast    TSH    2. Weight loss  R63.4 TSH       Cancer Staging  Cancer of tonsillar fossa (HCC) Staging form: Pharynx - HPV-Mediated Oropharynx, AJCC 8th Edition - Pathologic stage from 04/27/2023: Stage II (pT2, pN2, cM0, p16+) - Signed by Lonie Peak, MD on 04/27/2023 Stage prefix: Initial diagnosis  ==========DELIVERED PLANS==========  First Treatment Date: 2023-05-23 - Last Treatment Date: 2023-07-05   Plan Name: [email or web address]_[id number]_Tonsil Site: Tonsil, Right Technique: IMRT Mode: Photon Dose Per Fraction: 2 Gy Prescribed Dose (Delivered / Prescribed): 60 Gy / 60 Gy Prescribed Fxs (Delivered / Prescribed): 30 / 30  CHIEF COMPLAINT:  Here for surveillance of tonsillar cancer and to review his most recent CT scans. He completed radiation on 07/05/23.   CT of the chest on 10/04/2023 demonstrates a decrease in size to the RUL pulmonary nodule. With no new evidence of disease progression. CT of the neck shows no evidence of residual or recurrent right tonsillar mass with probable post radiation edema in the region of the pharynx. No lymphadenopathy was seen.   Pain issues, if any: no pain to report Using a feeding tube?: just flushes Weight changes, if any:     Wt Readings from Last 3 Encounters:  10/09/23 177 lb 6 oz (80.5 kg)  09/07/23 178 lb 11.2 oz (81.1 kg)  07/19/23 183 lb (83 kg)    Swallowing issues, if any: doing well though spicy food does burn Smoking or chewing tobacco? Still smoke cigarettes  Using fluoride toothpaste daily? none Last ENT visit was on: not since treatment Other notable issues, if  any: Still having several side effects including thick mucus and dry mouth as well.    ALLERGIES:  has No Known Allergies.  Meds: Current Outpatient Medications  Medication Sig Dispense Refill   augmented betamethasone dipropionate (DIPROLENE-AF) 0.05 % cream APPLY TO RASH ON THE SKIN TWICE DAILY FOR 2 3 WEEKS.     Nutritional Supplements (KATE FARMS STANDARD 1.4) LIQD [employer] 1.4 - Give one (325 ml) carton via PEG 5x/day. Flush tube with 60 ml water before and after each bolus. Drink by mouth or give via tube additional 711 ml (3 cups) water daily. This provides 2275 kcal, 100 grams protein, 1154 ml free water from formula (2345 ml total water with flushes). Meets 100% RDI     oxyCODONE (OXY IR/ROXICODONE) 5 MG immediate release tablet Take 1 tablet (5 mg total) by mouth every 6 (six) hours as needed for severe pain. 30 tablet 0   sulfamethoxazole-trimethoprim (BACTRIM DS) 800-160 MG tablet Take 1 tablet by mouth 2 (two) times daily. 14 tablet 0   tamsulosin (FLOMAX) 0.4 MG CAPS capsule Take 2 capsules (0.8 mg total) by mouth daily after breakfast. 60 capsule 0   lidocaine (XYLOCAINE) 2 % solution Use as directed 15 mLs in the mouth or throat as needed for mouth pain. (Patient not taking: Reported on 10/09/2023) 100 mL 0   No current facility-administered medications for this encounter.    Physical Findings:  The patient is in no  acute distress. Patient is alert and oriented. Wt Readings from Last 3 Encounters:  10/09/23 177 lb 6 oz (80.5 kg)  09/07/23 178 lb 11.2 oz (81.1 kg)  07/19/23 183 lb (83 kg)    height is 6\' 3"  (1.905 m) and weight is 177 lb 6 oz (80.5 kg). His temporal temperature is 97.1 F (36.2 C) (abnormal). His blood pressure is 125/66 and his pulse is 71. His respiration is 18 and oxygen saturation is 100%. .  General: Alert and oriented, in no acute distress HEENT: Head is normocephalic. Extraocular movements are intact. Post surgical changes in his throat. Uvula is  tethered to the right soft palate. Uvula is swollen but no evidence of tumor. No thrush.  Neck: Neck is notable for no cervical, supraclavicular, or axillary lymphadenopathy. Mild lymphedema in the anterior neck. No concerning masses palpated.  Skin: Skin in treatment fields shows hyperpigmentation and dryness in the treatment fields. Some tenderness to palpation.  Heart: Regular in rate and rhythm with no murmurs, rubs, or gallops. Chest: Scattered wheezes in his chest. Port-a-cath in place.  Abdomen: Soft, nontender, nondistended, with no rigidity or guarding. PEG tube in place.  Extremities: No cyanosis or edema. Lymphatics: see Neck Exam Psychiatric: Judgment and insight are intact. Affect is appropriate.   Lab Findings: Lab Results  Component Value Date   WBC 5.2 09/07/2023   HGB 13.3 09/07/2023   HCT 37.7 (L) 09/07/2023   MCV 102.4 (H) 09/07/2023   PLT 141 (L) 09/07/2023    Lab Results  Component Value Date   TSH 0.853 03/06/2023    Radiographic Findings: CT Chest W Contrast  Result Date: 10/04/2023 CLINICAL DATA:  Follow up right tonsillar cancer diagnosed in April 2024. Post surgery and completion of chemotherapy and radiation therapy. Cough. * Tracking Code: BO * EXAM: CT CHEST WITH CONTRAST TECHNIQUE: Multidetector CT imaging of the chest was performed during intravenous contrast administration. RADIATION DOSE REDUCTION: This exam was performed according to the departmental dose-optimization program which includes automated exposure control, adjustment of the mA and/or kV according to patient size and/or use of iterative reconstruction technique. CONTRAST:  OMNIPAQUE IOHEXOL 300 MG/ML  SOLN COMPARISON:  Chest CT 05/18/2023 and PET-CT 03/12/2023. FINDINGS: Neck findings dictated separately. Cardiovascular: No acute vascular findings. Atherosclerosis of the aorta, great vessels and coronary arteries. Right IJ Port-A-Cath extends to the lower SVC level. Stable mild  dilatation of the ascending aorta which measures up to 4.2 cm in diameter on coronal image 47/6. Stable small pericardial effusion. The heart size is normal. Mediastinum/Nodes: There are no enlarged mediastinal, hilar or axillary lymph nodes. The thyroid gland, trachea and esophagus demonstrate no significant findings. Lungs/Pleura: Trace bilateral pleural effusions. No pneumothorax. Moderate centrilobular and paraseptal emphysema. The previously demonstrated right upper lobe nodule measures up to 1.0 x 0.7 cm on image 49/5 and no longer demonstrates central cavitation. This previously measured up to 1.4 x 0.8 cm. This appears tubular on the reformatted images, measuring up to 4.6 cm in length on coronal image 83/6. No new or enlarging nodules are identified. Stable mild biapical scarring. Upper abdomen: No acute findings. Percutaneous G-tube extends into the mid stomach. Grossly stable 8.2 cm cyst in the upper pole of the left kidney and small cyst anteriorly in the liver for which no specific follow-up imaging is recommended. The adrenal glands appear unchanged. Musculoskeletal/Chest wall: There is no chest wall mass or suspicious osseous finding. Unless specific follow-up recommendations are mentioned in the findings or impression sections,  no imaging follow-up of any mentioned incidental findings is recommended. IMPRESSION: 1. The previously demonstrated tubular right upper lobe pulmonary nodule has decreased in size and no longer demonstrates central cavitation. Continued attention on follow-up recommended. 2. No new or enlarging pulmonary nodules. 3. No evidence of metastatic disease in the chest. 4. Stable mild dilatation of the ascending aorta. 5. Neck findings dictated separately. 6. Aortic Atherosclerosis (ICD10-I70.0) and Emphysema (ICD10-J43.9). Electronically Signed   By: Carey Bullocks M.D.   On: 10/04/2023 15:27   CT Soft Tissue Neck W Contrast  Result Date: 10/04/2023 CLINICAL DATA:  Head and  neck cancer of the right tonsil diagnosed in April of this year with tonsillectomy, chemotherapy and radiation. Difficulty swallowing. EXAM: CT NECK WITH CONTRAST TECHNIQUE: Multidetector CT imaging of the neck was performed using the standard protocol following the bolus administration of intravenous contrast. RADIATION DOSE REDUCTION: This exam was performed according to the departmental dose-optimization program which includes automated exposure control, adjustment of the mA and/or kV according to patient size and/or use of iterative reconstruction technique. CONTRAST:  OMNIPAQUE IOHEXOL 300 MG/ML  SOLN COMPARISON:  CT neck 01/29/2023.  PET scan 03/12/2023. FINDINGS: Pharynx and larynx: No mucosal or submucosal lesion. No evidence of residual or recurrent right tonsillar mass. Probable mild post radiation edema of the mucosa and submucosal tissues of the region of the pharynx. Salivary glands: Parotid glands are normal. Right submandibular gland is surgically absent. Left submandibular gland is normal. Thyroid: Normal Lymph nodes: No lymphadenopathy on either side of the neck. Previously seen abnormal nodes on the right have been successfully treated on the basis of this CT. Vascular: Right-sided power port. Aortic atherosclerosis. No other vascular finding of note. Limited intracranial: Normal Visualized orbits: Normal Mastoids and visualized paranasal sinuses: No significant sinus disease. Small mastoid effusion on the right. Skeleton: Ordinary cervical spondylosis. Upper chest: Lung apices show emphysema and scarring. See results of chest CT relating to a medial right upper lobe lesion. Other: None IMPRESSION: 1. No evidence of residual or recurrent right tonsillar mass. Probable mild post radiation edema of the mucosa and submucosal tissues of the region of the pharynx. 2. No lymphadenopathy on either side of the neck. Previously seen abnormal nodes on the right have been successfully treated on the  basis of this CT. 3. Emphysema and scarring in the lung apices. See results of chest CT relating to a medial right upper lobe lesion. 4. Aortic atherosclerosis. 5. Small mastoid effusion on the right. Aortic Atherosclerosis (ICD10-I70.0) and Emphysema (ICD10-J43.9). Electronically Signed   By: Paulina Fusi M.D.   On: 10/04/2023 14:38    Impression/Plan:    1) Head and Neck Cancer Status: Patient is doing well overall since completing radiation treatment. We personally reviewed his imaging today. His most recent scans show no evidence of disease progression/recurrence.   2) Nutritional Status: stable  Wt Readings from Last 3 Encounters:  10/09/23 177 lb 6 oz (80.5 kg)  09/07/23 178 lb 11.2 oz (81.1 kg)  07/19/23 183 lb (83 kg)   PEG tube: In-place. Last used in 1 week ago. He is eager to get this removed. We discussed the indications (stable weight without using for 1 month) for removal.   3) Risk Factors: The patient has been educated about risk factors including alcohol and tobacco abuse; they understand that avoidance of alcohol and tobacco is important to prevent recurrences as well as other cancers   4) Swallowing: doing well with swallowing. Was discharged by Milagros Loll  on 07/23/2023.   5) Dental: Encouraged to continue regular followup with dentistry, and dental hygiene including fluoride rinses. Using fluoride rinses approximately every other night.   6) Thyroid function: stable, checking annually.  Will order tsh for 63mo from now Lab Results  Component Value Date   TSH 0.853 03/06/2023   7) Abdominal aortic aneurysm: stable at 4.2 cm on most recent scan today. Patient will follow-up with his PCP for management of this.   8) Follow-up: Will get a CT scan of the neck and chest with contrast in 1 year with a follow-up with [place] 3-4 days afterwards. The patient was encouraged to call with any issues or questions before then. We will reach out to Dr. Al Pimple and ask to see him in 6  months to continue regular follow-up. Victorino Dike will schedule the patient to see Dr. Hezzie Bump in 3 months. We will review his case at our tumor board tomorrow.   On date of service, in total, I spent 20 minutes on this encounter. Patient was seen in person. _____________________________________   Joyice Faster, PA-C    Lonie Peak, MD

## 2023-10-09 NOTE — Progress Notes (Signed)
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   Met with patient and wife in office following appointment with Dr. Basilio Cairo. Patient glad to share scan showed no signs of cancer. He has been working to increase oral intake. Patient has not used feeding tube in the last 7 days. Patient reports taste is slowly returning. Sweets are not as appealing as they once were. States that chocolate taste flat. Patient continues to drink Boost as needed if texture/taste of food is not tolerable. Patient is drinking a lot more water secondary to dry mouth. Reports "at least" 4 bottles everyday.    Medications: reviewed   Labs: no new labs for review  Anthropometrics: Wt 177 lb 6 oz today   9/13 - 178 lb 11.2 oz  7/23 - 181 lb 12.8 oz    NUTRITION DIAGNOSIS: Predicted suboptimal intake - resolved    INTERVENTION:  Encouraged high calorie high protein foods for wt maintenance Pt to contact nurse navigator in ~2 weeks to assess readiness of tube removal Continue daily water flush    MONITORING, EVALUATION, GOAL: wt trends, intake   NEXT VISIT: No follow-up scheduled. Pt has contact information, encouraged to call with nutrition questions/concerns

## 2023-10-10 NOTE — Progress Notes (Signed)
Oncology Nurse Navigator Documentation   I met with Ethan [name] during his follow up with Dr. Basilio Cairo yesterday. 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 He received post treatment scan results today. He will see Dr. Hezzie Bump in 3 months on 01/14/23 for ENT follow up. He'll see Dr. Al Pimple in 6 months for medical oncology follow up and Dr. Basilio Cairo in one year after imaging is completed for results. He and his wife know to call me if they have any questions.   Hedda Slade RN, BSN, OCN Head & Neck Oncology Nurse Navigator [facility] Phone # [phone]  Fax # [phone]

## 2023-10-11 ENCOUNTER — Telehealth: Payer: Self-pay | Admitting: Hematology and Oncology

## 2023-10-11 NOTE — Telephone Encounter (Signed)
Per staff message sent on 10/16 patient is aware of scheduled follow up with [facility]

## 2023-10-23 ENCOUNTER — Other Ambulatory Visit: Payer: Self-pay

## 2023-10-23 DIAGNOSIS — C090 Malignant neoplasm of tonsillar fossa: Secondary | ICD-10-CM

## 2023-10-26 ENCOUNTER — Other Ambulatory Visit: Payer: Self-pay

## 2023-10-26 DIAGNOSIS — C090 Malignant neoplasm of tonsillar fossa: Secondary | ICD-10-CM

## 2023-11-01 ENCOUNTER — Other Ambulatory Visit (HOSPITAL_COMMUNITY): Payer: Self-pay | Admitting: Student

## 2023-11-05 ENCOUNTER — Ambulatory Visit (HOSPITAL_COMMUNITY): Payer: Medicare Other

## 2023-11-05 ENCOUNTER — Other Ambulatory Visit (HOSPITAL_COMMUNITY): Payer: Medicare Other

## 2023-11-14 ENCOUNTER — Ambulatory Visit: Payer: Self-pay | Admitting: Urology

## 2023-11-16 ENCOUNTER — Ambulatory Visit
Admission: RE | Admit: 2023-11-16 | Discharge: 2023-11-16 | Disposition: A | Discharge status: Home / Self Care | Payer: Medicare Other | Source: Ambulatory Visit | Attending: Radiation Oncology | Admitting: Radiation Oncology

## 2023-11-16 VITALS — BP 136/73 | HR 67 | Temp 97.0°F | Resp 18 | Wt 170.8 lb

## 2023-11-16 DIAGNOSIS — F17210 Nicotine dependence, cigarettes, uncomplicated: Secondary | ICD-10-CM | POA: Insufficient documentation

## 2023-11-16 DIAGNOSIS — I714 Abdominal aortic aneurysm, without rupture, unspecified: Secondary | ICD-10-CM | POA: Insufficient documentation

## 2023-11-16 DIAGNOSIS — C090 Malignant neoplasm of tonsillar fossa: Secondary | ICD-10-CM | POA: Insufficient documentation

## 2023-11-16 DIAGNOSIS — Z79899 Other long term (current) drug therapy: Secondary | ICD-10-CM | POA: Diagnosis not present

## 2023-11-16 NOTE — Progress Notes (Signed)
Oncology Nurse Navigator Documentation   I met with Mr. Stennis and his wife before and after his follow up with Dr. Basilio Cairo. At his request I will cancel his PAC/PEG removal scheduled for Monday due to continued difficulties swallowing. He would like to reschedule for about 3 weeks from now. I have also contacted the head and neck navigator with Dr. Hezzie Bump at [facility] and requested an appointment to evaluate the ulcer in his mouth as soon as possible. Mr. Mcnichol knows to call me if he has any questions or concerns.  Hedda Slade RN, BSN, OCN Head & Neck Oncology Nurse Navigator [facility] at [facility] Phone # [phone]  Fax # [phone]

## 2023-11-16 NOTE — Progress Notes (Signed)
Ethan [name] presents today for follow-up after completing radiation to his right tonsil on 05/23/2023  Pain issues, if any: Reports constant upper throat and right side of mouth pain. Reports he uses an OTC mouth spray that provides brief relief  Using a feeding tube?: No--Scheduled to be removed 11/19/2023 but patient is now unsure if he wants to precede with removal given pain. Weight changes, if any:  Wt Readings from Last 3 Encounters:  11/16/23 170 lb 12.8 oz (77.5 kg)  10/09/23 177 lb 6 oz (80.5 kg)  09/07/23 178 lb 11.2 oz (81.1 kg)   Swallowing issues, if any: Denies, but does report pain with swallowing liquids and solids Smoking or chewing tobacco? Reports occasionally smoking cigarettes  Using fluoride toothpaste daily? Yes--denies any dental concerns or mouth sores Last ENT visit was on: Not since diagnosis  Other notable issues, if any:

## 2023-11-16 NOTE — Progress Notes (Signed)
Radiation Oncology         ([phone]) [phone] ________________________________  Name: Ethan [name] MRN: [id number]  Date: 11/16/2023  DOB: 26-Nov-1951  Follow-Up Visit Note  CC: Practice, [facility]  Corey Skains, MD  Diagnosis and Prior Radiotherapy:       ICD-10-CM   1. 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        Cancer Staging  Cancer of tonsillar fossa (HCC) Staging form: Pharynx - HPV-Mediated Oropharynx, AJCC 8th Edition - Pathologic stage from 04/27/2023: Stage II (pT2, pN2, cM0, p16+) - Signed by Lonie Peak, MD on 04/27/2023 Stage prefix: Initial diagnosis  ==========DELIVERED PLANS==========  First Treatment Date: 2023-05-23 - Last Treatment Date: 2023-07-05   Plan Name: HN_R_Tonsil Site: Tonsil, Right Technique: IMRT Mode: Photon Dose Per Fraction: 2 Gy Prescribed Dose (Delivered / Prescribed): 60 Gy / 60 Gy Prescribed Fxs (Delivered / Prescribed): 30 / 30  CHIEF COMPLAINT:  new sore in throat.  NARRATIVE: The patient developed a new sore in his throat over the past few weeks.  He contacted his local ENT for an appointment and states he was redirected to our clinic after being told that this was likely due to radiation  Pain issues, if any: Reports constant upper throat and right side of mouth pain. Reports he uses an OTC mouth spray that provides brief relief  Using a feeding tube?: No--Scheduled to be removed 11/19/2023 but patient is now unsure if he wants to precede with removal given pain. Weight changes, if any:  Wt Readings from Last 3 Encounters:  11/16/23 170 lb 12.8 oz (77.5 kg)  10/09/23 177 lb 6 oz (80.5 kg)  09/07/23 178 lb 11.2 oz (81.1 kg)   Swallowing issues, if any: Denies, but does report pain with swallowing liquids and solids Smoking or chewing tobacco? Reports occasionally smoking cigarettes  Using fluoride toothpaste daily? Yes--denies any dental concerns or mouth sores Last ENT visit was on: Not since  diagnosis  Other notable issues, if any:    ALLERGIES:  has No Known Allergies.  Meds: Current Outpatient Medications  Medication Sig Dispense Refill   augmented betamethasone dipropionate (DIPROLENE-AF) 0.05 % cream APPLY TO RASH ON THE SKIN TWICE DAILY FOR 2 3 WEEKS.     lidocaine (XYLOCAINE) 2 % solution Use as directed 15 mLs in the mouth or throat as needed for mouth pain. (Patient not taking: Reported on 10/09/2023) 100 mL 0   Nutritional Supplements (KATE FARMS STANDARD 1.4) LIQD [employer] 1.4 - Give one (325 ml) carton via PEG 5x/day. Flush tube with 60 ml water before and after each bolus. Drink by mouth or give via tube additional 711 ml (3 cups) water daily. This provides 2275 kcal, 100 grams protein, 1154 ml free water from formula (2345 ml total water with flushes). Meets 100% RDI     oxyCODONE (OXY IR/ROXICODONE) 5 MG immediate release tablet Take 1 tablet (5 mg total) by mouth every 6 (six) hours as needed for severe pain. 30 tablet 0   sulfamethoxazole-trimethoprim (BACTRIM DS) 800-160 MG tablet Take 1 tablet by mouth 2 (two) times daily. 14 tablet 0   tamsulosin (FLOMAX) 0.4 MG CAPS capsule Take 2 capsules (0.8 mg total) by mouth daily after breakfast. 60 capsule 0   No current facility-administered medications for this encounter.    Physical Findings:  The patient is in no acute distress. Patient is alert and oriented. Wt Readings from Last 3 Encounters:  11/16/23 170 lb 12.8 oz (77.5  kg)  10/09/23 177 lb 6 oz (80.5 kg)  09/07/23 178 lb 11.2 oz (81.1 kg)    weight is 170 lb 12.8 oz (77.5 kg). His temperature is 97 F (36.1 C) (abnormal). His blood pressure is 136/73 and his pulse is 67. His respiration is 18 and oxygen saturation is 99%. .  General: Alert and oriented, in no acute distress HEENT: Head is normocephalic. Extraocular movements are intact. Post surgical changes in his throat.  There is a 1.5 cm ulcer in the right soft palate just lateral to his uvula.   Thick secretions in mouth. Neck: Neck is notable for no cervical, supraclavicular, or axillary lymphadenopathy.  No concerning masses palpated.  Skin: Skin in treatment fields shows hyperpigmentation and dryness in the treatment fields. Lymphatics: see Neck Exam Psychiatric: Judgment and insight are intact. Affect is appropriate.   Lab Findings: Lab Results  Component Value Date   WBC 5.2 09/07/2023   HGB 13.3 09/07/2023   HCT 37.7 (L) 09/07/2023   MCV 102.4 (H) 09/07/2023   PLT 141 (L) 09/07/2023    Lab Results  Component Value Date   TSH 0.853 03/06/2023    Radiographic Findings: No results found.  Impression/Plan:    1) Head and Neck Cancer Status: There is a 1.5 cm ulcer in the right soft palate just lateral to his uvula, concerning for disease recurrence.  He is still smoking.  This was not visible 5 weeks ago when he came in to review his restaging scans - at that time he was in remission radiographically and by physical exam. We will try to expedite a referral back to Dr. Hezzie Bump for workup.  We will cancel his PEG tube removal.  The patient was again advised to stop smoking.  He is not interested in any pharmaceutical prescriptions to help with this at this time  2) Nutritional Status: He has lost some weight recently, probably related to throat pain.  Advised to use feeding tube as needed. Wt Readings from Last 3 Encounters:  11/16/23 170 lb 12.8 oz (77.5 kg)  10/09/23 177 lb 6 oz (80.5 kg)  09/07/23 178 lb 11.2 oz (81.1 kg)    PEG tube: We will keep this in indefinitely.  3) Risk Factors: The patient has been educated about risk factors including alcohol and tobacco abuse; they understand that avoidance of alcohol and tobacco is important to prevent recurrences as well as other cancers.  He is still smoking and again was advised to quit.  4) Swallowing: doing well with swallowing except for pain. Was discharged by Milagros Loll on 07/23/2023.   5) Dental:  Encouraged to continue regular followup with dentistry, and dental hygiene including fluoride rinses. Using fluoride rinses approximately every other night.   6) Thyroid function: stable, checking annually.   Lab Results  Component Value Date   TSH 0.853 03/06/2023   7) Abdominal aortic aneurysm: stable at 4.2 cm on most recent scan. Patient will follow-up with his PCP for management of this.   8) Follow-up: As discussed above we will refer him back to Dr. Hezzie Bump as soon as possible.  I will see him back in 3 months.  On date of service, in total, I spent 20 minutes on this encounter. Patient was seen in person.  -----------------   Lonie Peak, MD

## 2023-11-19 ENCOUNTER — Other Ambulatory Visit (HOSPITAL_COMMUNITY): Payer: Medicare Other

## 2023-12-10 ENCOUNTER — Ambulatory Visit (HOSPITAL_COMMUNITY)
Admission: RE | Admit: 2023-12-10 | Discharge: 2023-12-10 | Disposition: A | Discharge status: Home / Self Care | Payer: Medicare Other | Source: Ambulatory Visit | Attending: Hematology and Oncology | Admitting: Hematology and Oncology

## 2023-12-10 DIAGNOSIS — Z452 Encounter for adjustment and management of vascular access device: Secondary | ICD-10-CM | POA: Insufficient documentation

## 2023-12-10 DIAGNOSIS — C090 Malignant neoplasm of tonsillar fossa: Secondary | ICD-10-CM | POA: Diagnosis not present

## 2023-12-10 DIAGNOSIS — Z9221 Personal history of antineoplastic chemotherapy: Secondary | ICD-10-CM | POA: Diagnosis not present

## 2023-12-10 DIAGNOSIS — Z923 Personal history of irradiation: Secondary | ICD-10-CM | POA: Insufficient documentation

## 2023-12-10 HISTORY — PX: IR REMOVAL TUN ACCESS W/ PORT W/O FL MOD SED: IMG2290

## 2023-12-10 HISTORY — PX: IR GASTROSTOMY TUBE REMOVAL: IMG5492

## 2023-12-10 MED ORDER — LIDOCAINE HCL 1 % IJ SOLN
20.0000 mL | Freq: Once | INTRAMUSCULAR | Status: AC
  Administered 2023-12-10: 3 mL via INTRADERMAL

## 2023-12-10 MED ORDER — LIDOCAINE HCL 1 % IJ SOLN
INTRAMUSCULAR | Status: AC
  Filled 2023-12-10: qty 20

## 2024-02-05 NOTE — Progress Notes (Signed)
 Mr. Junie Panning presents today for a follow-up appointment. He completed radiation to his right tonsil on 05/23/2023   Pain issues, if any: 4 out of 10 throat pain after eating.  Using a feeding tube?: Patient had feeding tube removed in the past. Weight changes, if any: Patient has gained a little bit of weight. Swallowing issues, if any: Patient denies. Smoking or chewing tobacco? Patient currently smokes 6 cigarettes a day. He used to smoke a whole pack. Using fluoride toothpaste daily? Yes Last ENT visit was on: Seen on January 21st 2025 Other notable issues, if any:  None    BP (!) 142/74 (BP Location: Right Arm, Patient Position: Sitting, Cuff Size: Normal)   Pulse 73   Temp 97.8 F (36.6 C)   Resp 20   Ht 6\' 3"  (1.905 m)   Wt 177 lb 9.6 oz (80.6 kg)   SpO2 99%   BMI 22.20 kg/m    Wt Readings from Last 3 Encounters:  02/19/24 177 lb 9.6 oz (80.6 kg)  11/16/23 170 lb 12.8 oz (77.5 kg)  10/09/23 177 lb 6 oz (80.5 kg)

## 2024-02-19 ENCOUNTER — Telehealth: Payer: Self-pay | Admitting: *Deleted

## 2024-02-19 ENCOUNTER — Ambulatory Visit
Admission: RE | Admit: 2024-02-19 | Discharge: 2024-02-19 | Disposition: A | Discharge status: Home / Self Care | Payer: Medicare Other | Source: Ambulatory Visit | Attending: Radiation Oncology | Admitting: Radiation Oncology

## 2024-02-19 ENCOUNTER — Other Ambulatory Visit: Payer: Self-pay

## 2024-02-19 VITALS — BP 142/74 | HR 73 | Temp 97.8°F | Resp 20 | Ht 75.0 in | Wt 177.6 lb

## 2024-02-19 DIAGNOSIS — Z923 Personal history of irradiation: Secondary | ICD-10-CM | POA: Insufficient documentation

## 2024-02-19 DIAGNOSIS — Z08 Encounter for follow-up examination after completed treatment for malignant neoplasm: Secondary | ICD-10-CM | POA: Insufficient documentation

## 2024-02-19 DIAGNOSIS — C090 Malignant neoplasm of tonsillar fossa: Secondary | ICD-10-CM | POA: Insufficient documentation

## 2024-02-19 DIAGNOSIS — Z85818 Personal history of malignant neoplasm of other sites of lip, oral cavity, and pharynx: Secondary | ICD-10-CM | POA: Diagnosis present

## 2024-02-19 DIAGNOSIS — R634 Abnormal weight loss: Secondary | ICD-10-CM

## 2024-02-19 LAB — TSH: TSH: 2.923 u[IU]/mL (ref 0.350–4.500)

## 2024-02-19 NOTE — Progress Notes (Signed)
 Radiation Oncology         (336) [phone] ________________________________  Name: Ethan [name] MRN: [id number]  Date: 02/19/2024  DOB: 1951-04-04  Follow-Up Visit Note  CC: Practice, [facility]  Corey Skains, MD  Diagnosis and Prior Radiotherapy:     No diagnosis found.    Cancer Staging  Cancer of tonsillar fossa (HCC) Staging form: Pharynx - HPV-Mediated Oropharynx, AJCC 8th Edition - Pathologic stage from 04/27/2023: Stage II (pT2, pN2, cM0, p16+) - Signed by Lonie Peak, MD on 04/27/2023 Stage prefix: Initial diagnosis  ==========DELIVERED PLANS==========  First Treatment Date: 2023-05-23 - Last Treatment Date: 2023-07-05   Plan Name: HN_R_Tonsil Site: Tonsil, Right Technique: IMRT Mode: Photon Dose Per Fraction: 2 Gy Prescribed Dose (Delivered / Prescribed): 60 Gy / 60 Gy Prescribed Fxs (Delivered / Prescribed): 30 / 30  CHIEF COMPLAINT:  new sore in throat.  NARRATIVE: Mr. Ethan [name] presents today for a follow-up appointment. He completed radiation to his right tonsil on 05/23/2023   Since we last saw him patient presented to Dr. Hezzie Bump on ***  Pain issues, if any: 4 out of 10 throat pain after eating.  Using a feeding tube?: Patient had feeding tube removed in the past. Weight changes, if any: Patient has gained a little bit of weight. Swallowing issues, if any: Patient denies. Smoking or chewing tobacco? Patient currently smokes 6 cigarettes a day. He used to smoke a whole pack. Using fluoride toothpaste daily? Yes Last ENT visit was on: Seen on January 21st 2025 *** Other notable issues, if any:  None   ALLERGIES:  has no known allergies.  Meds: Current Outpatient Medications  Medication Sig Dispense Refill   tamsulosin (FLOMAX) 0.4 MG CAPS capsule Take 2 capsules (0.8 mg total) by mouth daily after breakfast. 60 capsule 0   augmented betamethasone dipropionate (DIPROLENE-AF) 0.05 % cream APPLY TO RASH ON THE SKIN TWICE DAILY FOR 2 3  WEEKS. (Patient not taking: Reported on 02/19/2024)     lidocaine (XYLOCAINE) 2 % solution Use as directed 15 mLs in the mouth or throat as needed for mouth pain. (Patient not taking: Reported on 10/09/2023) 100 mL 0   Nutritional Supplements (KATE FARMS STANDARD 1.4) LIQD [employer] 1.4 - Give one (325 ml) carton via PEG 5x/day. Flush tube with 60 ml water before and after each bolus. Drink by mouth or give via tube additional 711 ml (3 cups) water daily. This provides 2275 kcal, 100 grams protein, 1154 ml free water from formula (2345 ml total water with flushes). Meets 100% RDI     oxyCODONE (OXY IR/ROXICODONE) 5 MG immediate release tablet Take 1 tablet (5 mg total) by mouth every 6 (six) hours as needed for severe pain. 30 tablet 0   sulfamethoxazole-trimethoprim (BACTRIM DS) 800-160 MG tablet Take 1 tablet by mouth 2 (two) times daily. 14 tablet 0   No current facility-administered medications for this encounter.    Physical Findings:  The patient is in no acute distress. Patient is alert and oriented. Wt Readings from Last 3 Encounters:  02/19/24 177 lb 9.6 oz (80.6 kg)  11/16/23 170 lb 12.8 oz (77.5 kg)  10/09/23 177 lb 6 oz (80.5 kg)    height is 6\' 3"  (1.905 m) and weight is 177 lb 9.6 oz (80.6 kg). His temperature is 97.8 F (36.6 C). His blood pressure is 142/74 (abnormal) and his pulse is 73. His respiration is 20 and oxygen saturation is 99%. .  General: Alert and oriented, in no  acute distress HEENT: Head is normocephalic. Extraocular movements are intact. Post surgical changes in his throat.  There is a 1.5 cm ulcer in the right soft palate just lateral to his uvula.  Thick secretions in mouth. Neck: Neck is notable for no cervical, supraclavicular, or axillary lymphadenopathy.  No concerning masses palpated.  Skin: Skin in treatment fields shows hyperpigmentation and dryness in the treatment fields. Lymphatics: see Neck Exam Psychiatric: Judgment and insight are intact. Affect  is appropriate.   Lab Findings: Lab Results  Component Value Date   WBC 5.2 09/07/2023   HGB 13.3 09/07/2023   HCT 37.7 (L) 09/07/2023   MCV 102.4 (H) 09/07/2023   PLT 141 (L) 09/07/2023    Lab Results  Component Value Date   TSH 0.853 03/06/2023    Radiographic Findings: No results found.  Impression/Plan:    1) Head and Neck Cancer Status: There is a 1.5 cm ulcer in the right soft palate just lateral to his uvula, concerning for disease recurrence.  He is still smoking.  This was not visible 5 weeks ago when he came in to review his restaging scans - at that time he was in remission radiographically and by physical exam. We will try to expedite a referral back to Dr. Hezzie Bump for workup. ***  We will cancel his PEG tube removal.  The patient was again advised to stop smoking.  He is not interested in any pharmaceutical prescriptions to help with this at this time ***  2) Nutritional Status: He has lost some weight recently, probably related to throat pain.  Advised to use feeding tube as needed. *** Wt Readings from Last 3 Encounters:  02/19/24 177 lb 9.6 oz (80.6 kg)  11/16/23 170 lb 12.8 oz (77.5 kg)  10/09/23 177 lb 6 oz (80.5 kg)    PEG tube: We will keep this in indefinitely. ***  3) Risk Factors: The patient has been educated about risk factors including alcohol and tobacco abuse; they understand that avoidance of alcohol and tobacco is important to prevent recurrences as well as other cancers.  He is still smoking and again was advised to quit. ***  4) Swallowing: doing well with swallowing except for pain. Was discharged by Milagros Loll on 07/23/2023. ***  5) Dental: Encouraged to continue regular followup with dentistry, and dental hygiene including fluoride rinses. Using fluoride rinses approximately every other night. ***  6) Thyroid function: stable, checking annually.  *** Lab Results  Component Value Date   TSH 0.853 03/06/2023   7) Abdominal aortic  aneurysm: stable at 4.2 cm on most recent scan. Patient will follow-up with his PCP for management of this. ***  8) Follow-up: As discussed above we will refer him back to Dr. Hezzie Bump as soon as possible.  I will see him back in 3 months. ***  On date of service, in total, I spent 20 minutes on this encounter. Patient was seen in person.  -----------------    Bryan Lemma, PA-C

## 2024-02-19 NOTE — Telephone Encounter (Signed)
 CALLED PATIENT TO INFORM OF LAB APPT. TODAY @ 2:15 PM, SPOKE WITH PATIENT AND HE IS AWARE OF THIS LAB

## 2024-04-03 NOTE — Therapy (Signed)
 [place] [facility] [address], [address] [place], [place], [address] Phone: [phone]   Fax:  [phone]  Patient Details  Name: Ethan [name] MRN: [id number] Date of Birth: 1951/06/04 Referring Provider:  Lonie Peak, MD  Encounter Date: 04/03/2024  SPEECH THERAPY DISCHARGE SUMMARY  Visits from Start of Care: 3  Current functional level related to goals / functional outcomes: Pt did not schedule any more visits after 08/13/23. In a follow up appointment with rad onc in February '25 pt denied swallowing issues. The goals from his last scheduled appointment are below: SHORT TERM GOALS: Target: 3rd total session   Pt will complete HEP with modified independence in 2 sessions Baseline: 07/12/23 Goal status: Partially met   2.  pt will tell SLP why pt is completing HEP with modified independence Baseline:  Goal status: Met   3.  pt will describe 3 overt s/s aspiration PNA with modified independence Baseline:  Goal status: Met   4.  pt will tell SLP how a food journal could hasten return to a more normalized diet Baseline:  Goal status: Met     LONG TERM GOALS: Target: 7th total session   1.  pt will complete HEP with independence over two visits Baseline:  Goal status: Initial   2.  pt will describe how to modify HEP over time, and the timeline associated with reduction in HEP frequency with modified independence over two sessions Baseline:  Goal status: Initial   Remaining deficits: Unknown, due to unexpected d/c but SLP assumes no deficits based on pt's report from rad onc appointment in February 2025.   Education / Equipment: See therapy  notes.   Patient agrees to discharge. Patient goals were partially met. Patient is being discharged due to not returning since the last visit.Marland Kitchen      [name], CCC-SLP 04/03/2024, 9:14 AM  [place] [facility] [address] [place], [place], [address] Phone: [phone]   Fax:  [phone]

## 2024-04-08 ENCOUNTER — Ambulatory Visit: Payer: Medicare Other | Admitting: Hematology and Oncology

## 2024-04-08 ENCOUNTER — Inpatient Hospital Stay: Payer: Medicare Other | Attending: Hematology and Oncology | Admitting: Hematology and Oncology

## 2024-04-08 VITALS — BP 118/54 | HR 69 | Temp 97.3°F | Resp 18 | Wt 173.1 lb

## 2024-04-08 DIAGNOSIS — R42 Dizziness and giddiness: Secondary | ICD-10-CM | POA: Insufficient documentation

## 2024-04-08 DIAGNOSIS — R432 Parageusia: Secondary | ICD-10-CM | POA: Diagnosis not present

## 2024-04-08 DIAGNOSIS — C090 Malignant neoplasm of tonsillar fossa: Secondary | ICD-10-CM

## 2024-04-08 DIAGNOSIS — R634 Abnormal weight loss: Secondary | ICD-10-CM | POA: Diagnosis not present

## 2024-04-08 NOTE — Progress Notes (Signed)
 Patient Care Team: Practice, [facility] as PCP - General Colie Dawes, MD as Attending Physician (Radiation Oncology) Malmfelt, Nancyann Aye, RN as Registered Nurse Murleen Arms, MD as Consulting Physician (Hematology and Oncology) Remer Cargo, MD as Referring Physician (Otolaryngology) Woody Heading, RD as Dietitian (Dietician) Glorine Laroche (Dentistry) Virgina Grills, MD as Consulting Physician (Otolaryngology) Erman Hayward, MD as Consulting Physician (Urology)  SUMMARY OF ONCOLOGIC HISTORY: Oncology History  Cancer of tonsillar fossa (HCC)  02/07/2023 Initial Biopsy   Biopsy of the right tonsil  revealed: squamous cell carcinoma, p16 positive.    03/12/2023 PET scan   Right tonsillar mass as tracer avid and concerning for malignancy; hypermetabolic right level 2 and level 3 cervical lymph nodes compatible with metastatic adenopathy; indeterminate tracer avid right upper lobe elongated area of increased soft tissue thickening within the medial right upper lobe (SUV max of 5.08); and nonspecific mild tracer avid left hilar lymph nodes.    03/20/2023 Miscellaneous   Dentistry evaluation: Extractions of #18 and #19 with Dr. Ramiro Burly   04/06/2023 Surgery   TORS resection (Dr. Caldwell Castles): 2.7 cm tumor, histology of nonkeratinizing squamous cell carcinoma with focal maturation, HPV associated; + LVI and PNI; all margins negative.  8/12 + LN including right level 2A, 3, and 4 being +.  Extranodal extension present, largest tumor deposit 2.6cm.     04/27/2023 Cancer Staging   Staging form: Pharynx - HPV-Mediated Oropharynx, AJCC 8th Edition - Pathologic stage from 04/27/2023: Stage II (pT2, pN2, cM0, p16+) - Signed by Colie Dawes, MD on 04/27/2023 Stage prefix: Initial diagnosis   05/22/2023 - 06/26/2023 Chemotherapy   Patient is on Treatment Plan : HEAD/NECK Cisplatin (40) q7d     05/23/2023 - 07/04/2023 Radiation Therapy   Concurrent chemoradiation    Discussed the use  of AI scribe software for clinical note transcription with the patient, who gave verbal consent to proceed.  History of Present Illness Ethan [name] is a 73 year old male with a history of H and N cancer who presents with weight loss and thick saliva.  He has experienced a plateau in his weight, initially stable at 180 pounds, but now gradually decreasing. He attributes this to increased physical activity and possibly not matching his caloric intake with expenditure. Despite eating well, he continues to lose weight.  He describes persistent thick saliva and loss of taste, particularly when eating certain foods like steak, which becomes dry and hard to swallow. These symptoms have persisted for three to four months. He notes that certain textures, such as bread, are particularly difficult to manage.  He had FU with Dr Caldwell Castles recently and this was negative for recurrence. A NavDx test, which was 26 before a procedure, came back as zero, indicating no evidence of recurrence.  He experiences occasional dizziness, notably when lying under a car, which he attributes to aftereffects of previous chemotherapy and significant weight loss of 25 pounds. He is currently only taking Flomax.  Socially, he continues to smoke, though he has reduced his intake to about six cigarettes a day. He reflects on his brother's struggle with alcoholism and relates it to his own difficulty in quitting smoking.  No cough or shortness of breath. Reports increased bowel movements.  ALLERGIES:  has no known allergies.  MEDICATIONS:  Current Outpatient Medications  Medication Sig Dispense Refill   augmented betamethasone dipropionate (DIPROLENE-AF) 0.05 % cream APPLY TO RASH ON THE SKIN TWICE DAILY FOR 2 3 WEEKS. (Patient not taking: Reported on 02/19/2024)  tamsulosin (FLOMAX) 0.4 MG CAPS capsule Take 2 capsules (0.8 mg total) by mouth daily after breakfast. 60 capsule 0   No current facility-administered  medications for this visit.    PHYSICAL EXAMINATION: ECOG PERFORMANCE STATUS: 1 - Symptomatic but completely ambulatory  Vitals:   04/08/24 0859  BP: (!) 118/54  Pulse: 69  Resp: 18  Temp: (!) 97.3 F (36.3 C)  SpO2: 99%    Filed Weights   04/08/24 0859  Weight: 173 lb 1.6 oz (78.5 kg)     Physical Exam         Appears alert, oriented and in no acute distress    No regional adenopathy    Oral exam: Deviated left uvula, no tonsillar masses    CTA bilaterally    No LE edema  LABORATORY DATA:  I have reviewed the data as listed    Latest Ref Rng & Units 09/07/2023    8:18 AM 07/16/2023    8:17 AM 07/09/2023    8:40 AM  CMP  Glucose 70 - 99 mg/dL 87  83  562   BUN 8 - 23 mg/dL 21  22  27    Creatinine 0.61 - 1.24 mg/dL 1.30  8.65  7.84   Sodium 135 - 145 mmol/L 138  136  133   Potassium 3.5 - 5.1 mmol/L 4.0  4.0  4.1   Chloride 98 - 111 mmol/L 105  103  101   CO2 22 - 32 mmol/L 28  28  25    Calcium 8.9 - 10.3 mg/dL 9.1  9.0  8.9   Total Protein 6.5 - 8.1 g/dL 6.2     Total Bilirubin 0.3 - 1.2 mg/dL 0.6     Alkaline Phos 38 - 126 U/L 48     AST 15 - 41 U/L 15     ALT 0 - 44 U/L 8       Lab Results  Component Value Date   WBC 5.2 09/07/2023   HGB 13.3 09/07/2023   HCT 37.7 (L) 09/07/2023   MCV 102.4 (H) 09/07/2023   PLT 141 (L) 09/07/2023   NEUTROABS 4.2 09/07/2023    ASSESSMENT & PLAN:  Cancer of tonsillar fossa (HCC) Ethan [name] is a 73 year old man with 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  Assessment & Plan  SCC tonsil, no concern for recurrence Recent visit with Dr Hezzie Bump, reassuring CT neck and chest to be scheduled for Oct 2025 followed by a visit with Dr Basilio Cairo. FU with me in 1 yr  Weight loss Gradual weight loss from 180 pounds despite good diet, likely due to increased activity and post-chemotherapy effects. - Ensure caloric intake  matches expenditure to maintain weight.  Thick saliva and dysgeusia Thick saliva and loss of taste, especially with steak and bread, persisting for months, possibly new normal post-treatment. - Advised dietary adjustments.  Dizziness Occasional dizziness, possibly related to weight loss and chemotherapy recovery. - Monitor symptoms, consider relation to recovery.  Post-operative changes Deviated uvula to the left, likely post-operative, slightly red but not concerning.  Smoking Continues to smoke six cigarettes daily, reduced from previous levels. - Encouraged further reduction in smoking.  Most recent TSH lab normal.  Follow-up - Schedule CT neck and CT chest in October. - Follow up with Dr. Basilio Cairo on October 21. - Return for follow-up in one year.       No orders of the  defined types were placed in this encounter.  The patient has a good understanding of the overall plan. he agrees with it. he will call with any problems that may develop before the next visit here. Total time spent: 30 mins including face to face time and time spent for planning, charting and co-ordination of care   Murleen Arms, MD 04/08/24

## 2024-04-08 NOTE — Assessment & Plan Note (Addendum)
 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  Assessment & Plan  SCC tonsil, no concern for recurrence Recent visit with Ethan [name], reassuring CT neck and chest to be scheduled for Oct 2025 followed by a visit with Ethan [name]. FU with me in 1 yr  Weight loss Gradual weight loss from 180 pounds despite good diet, likely due to increased activity and post-chemotherapy effects. - Ensure caloric intake matches expenditure to maintain weight.  Thick saliva and dysgeusia Thick saliva and loss of taste, especially with steak and bread, persisting for months, possibly new normal post-treatment. - Advised dietary adjustments.  Dizziness Occasional dizziness, possibly related to weight loss and chemotherapy recovery. - Monitor symptoms, consider relation to recovery.  Post-operative changes Deviated uvula to the left, likely post-operative, slightly red but not concerning.  Smoking Continues to smoke six cigarettes daily, reduced from previous levels. - Encouraged further reduction in smoking.  Most recent TSH lab normal.  Follow-up - Schedule CT neck and CT chest in October. - Follow up with Ethan. Ethan [name] on October 21. - Return for follow-up in one year.

## 2024-07-11 ENCOUNTER — Encounter: Payer: Self-pay | Admitting: Advanced Practice Midwife

## 2024-09-16 ENCOUNTER — Ambulatory Visit: Payer: Medicare Other | Admitting: Radiation Oncology

## 2024-09-25 ENCOUNTER — Telehealth: Payer: Self-pay | Admitting: *Deleted

## 2024-09-25 NOTE — Telephone Encounter (Signed)
 Called patient to inform of Ct for 10-08-24 - arrival time- 7:15 am @ [facility], no restrictions to scan, patient to receive results from Dr. Izell on 10-21-24 @ 2:40 pm, spoke with patient and he is aware of these appts. and the instructions

## 2024-10-08 ENCOUNTER — Ambulatory Visit (HOSPITAL_COMMUNITY)
Admission: RE | Admit: 2024-10-08 | Discharge: 2024-10-08 | Disposition: A | Discharge status: Home / Self Care | Source: Ambulatory Visit | Attending: Radiology | Admitting: Radiology

## 2024-10-08 ENCOUNTER — Encounter (HOSPITAL_COMMUNITY): Payer: Self-pay

## 2024-10-08 DIAGNOSIS — C090 Malignant neoplasm of tonsillar fossa: Secondary | ICD-10-CM

## 2024-10-08 MED ORDER — IOHEXOL 300 MG/ML  SOLN
75.0000 mL | Freq: Once | INTRAMUSCULAR | Status: AC | PRN
  Administered 2024-10-08: 75 mL via INTRAVENOUS

## 2024-10-14 ENCOUNTER — Ambulatory Visit: Payer: Medicare Other | Admitting: Radiation Oncology

## 2024-10-14 ENCOUNTER — Ambulatory Visit: Payer: Self-pay | Admitting: Radiation Oncology

## 2024-10-16 NOTE — Progress Notes (Signed)
 Mr. Ethan [name] presents today for a follow up. He completed radiation treatment for Malignant Neoplasm of the Tonsillar Fossa on 07/05/2023.   Pain issues, if any: None Using a feeding tube?: Feeding tube removed  Weight changes, if any: Gained 6 pounds over the course of a year Swallowing issues, if any: None. Patient is able to eat and drink. Throat feels swollen at times  Smoking or chewing tobacco? None Using fluoride toothpaste daily? Yes Last ENT visit was on: February 2025 Other notable issues, if any: None BP (!) 141/77 (BP Location: Left Arm, Patient Position: Sitting, Cuff Size: Large)   Pulse 72   Temp 97.8 F (36.6 C)   Resp 20   Ht 6' 3 (1.905 m)   Wt 181 lb (82.1 kg)   SpO2 100%   BMI 22.62 kg/m   Wt Readings from Last 3 Encounters:  10/21/24 181 lb (82.1 kg)  04/08/24 173 lb 1.6 oz (78.5 kg)  02/19/24 177 lb 9.6 oz (80.6 kg)

## 2024-10-16 NOTE — Progress Notes (Incomplete)
 Marland Kitchen  rad

## 2024-10-21 ENCOUNTER — Ambulatory Visit
Admission: RE | Admit: 2024-10-21 | Discharge: 2024-10-21 | Disposition: A | Discharge status: Home / Self Care | Source: Ambulatory Visit | Attending: Radiation Oncology | Admitting: Radiation Oncology

## 2024-10-21 VITALS — BP 141/77 | HR 72 | Temp 97.8°F | Resp 20 | Ht 75.0 in | Wt 181.0 lb

## 2024-10-21 DIAGNOSIS — I7121 Aneurysm of the ascending aorta, without rupture: Secondary | ICD-10-CM | POA: Diagnosis not present

## 2024-10-21 DIAGNOSIS — Z923 Personal history of irradiation: Secondary | ICD-10-CM | POA: Insufficient documentation

## 2024-10-21 DIAGNOSIS — J432 Centrilobular emphysema: Secondary | ICD-10-CM | POA: Insufficient documentation

## 2024-10-21 DIAGNOSIS — I700 Atherosclerosis of aorta: Secondary | ICD-10-CM | POA: Diagnosis not present

## 2024-10-21 DIAGNOSIS — I2510 Atherosclerotic heart disease of native coronary artery without angina pectoris: Secondary | ICD-10-CM | POA: Insufficient documentation

## 2024-10-21 DIAGNOSIS — C090 Malignant neoplasm of tonsillar fossa: Secondary | ICD-10-CM | POA: Diagnosis present

## 2024-10-21 DIAGNOSIS — F17210 Nicotine dependence, cigarettes, uncomplicated: Secondary | ICD-10-CM | POA: Diagnosis not present

## 2024-10-21 NOTE — Progress Notes (Incomplete)
 Radiation Oncology         [phone] ________________________________  Name: Ethan [name] MRN: [id number]  Date: 10/21/2024  DOB: 07-18-1951  Follow-Up Visit Note  CC: Practice, [facility]*  Diagnosis and Prior Radiotherapy:     No diagnosis found.     Cancer Staging  Cancer of tonsillar fossa (HCC) Staging form: Pharynx - HPV-Mediated Oropharynx, AJCC 8th Edition - Pathologic stage from 04/27/2023: Stage II (pT2, pN2, cM0, p16+) - Signed by Izell Domino, MD on 04/27/2023 Stage prefix: Initial diagnosis  ==========DELIVERED PLANS==========  First Treatment Date: 2023-05-23 - Last Treatment Date: 2023-07-05   Plan Name: [id number]_R_Tonsil Site: Tonsil, Right Technique: IMRT Mode: Photon Dose Per Fraction: 2 Gy Prescribed Dose (Delivered / Prescribed): 60 Gy / 60 Gy Prescribed Fxs (Delivered / Prescribed): 30 / 30  CHIEF COMPLAINT:  new sore in throat.  NARRATIVE: Ethan [name] presents today for a follow-up appointment. He completed radiation to his right tonsil on 05/23/2023   Since we last saw him patient presented to Dr. Lauralee on 01/15/2024 for follow-up of the biopsy of the ulceration of the right soft palate. The results of this were fortunately benign. No evidence of deseae recurrence was seen on exam that day. Dr. Lauralee plans to follow-up with this patient in May.   Pain issues, if any: 4 out of 10 throat pain after eating.  Using a feeding tube?: Patient had feeding tube removed in December of 2024.  Weight changes, if any: Patient has gained a little bit of weight. Swallowing issues, if any: Patient denies. Smoking or chewing tobacco? Patient currently smokes 6 cigarettes a day. He used to smoke a whole pack. Using fluoride toothpaste daily? Yes Last ENT visit was on: Seen on January 21st 2025, see above.  Other notable issues, if any: None   ALLERGIES:  has no known allergies.  Meds: Current Outpatient Medications   Medication Sig Dispense Refill   augmented betamethasone dipropionate (DIPROLENE-AF) 0.05 % cream APPLY TO RASH ON THE SKIN TWICE DAILY FOR 2 3 WEEKS. (Patient not taking: Reported on 02/19/2024)     tamsulosin  (FLOMAX ) 0.4 MG CAPS capsule Take 2 capsules (0.8 mg total) by mouth daily after breakfast. 60 capsule 0   No current facility-administered medications for this encounter.    Physical Findings:  The patient is in no acute distress. Patient is alert and oriented. Wt Readings from Last 3 Encounters:  10/21/24 181 lb (82.1 kg)  04/08/24 173 lb 1.6 oz (78.5 kg)  02/19/24 177 lb 9.6 oz (80.6 kg)    height is 6' 3 (1.905 m) and weight is 181 lb (82.1 kg). His temperature is 97.8 F (36.6 C). His blood pressure is 141/77 (abnormal) and his pulse is 72. His respiration is 20 and oxygen saturation is 100%. .  General: Alert and oriented, in no acute distress HEENT: Head is normocephalic. Extraocular movements are intact. Uvula is deviated to the right, but no concerning lesions visualized. Thick secretions in mouth. Neck: Neck is notable for no cervical, supraclavicular, or axillary lymphadenopathy.  No concerning masses palpated.  Skin: Skin in treatment fields shows hyperpigmentation and dryness in the treatment fields. Lymphatics: see Neck Exam Psychiatric: Judgment and insight are intact. Affect is appropriate.   Lab Findings: Lab Results  Component Value Date   WBC 5.2 09/07/2023   HGB 13.3 09/07/2023   HCT 37.7 (L) 09/07/2023   MCV 102.4 (H) 09/07/2023   PLT 141 (L) 09/07/2023  Lab Results  Component Value Date   TSH 2.923 02/19/2024    Radiographic Findings: CT Soft Tissue Neck W Contrast Result Date: 10/10/2024 EXAM: CT NECK WITH CONTRAST 10/08/2024 07:56:47 AM TECHNIQUE: CT of the neck was performed with the administration of 75 mL of iohexol  (OMNIPAQUE ) 300 MG/ML solution. Multiplanar reformatted images are provided for review. Automated exposure control,  iterative reconstruction, and/or weight based adjustment of the mA/kV was utilized to reduce the radiation dose to as low as reasonably achievable. COMPARISON: CT 10/04/2023 and earlier. Dedicated chest CT the same day is reported separately. CLINICAL HISTORY: 73 year old male with history of treated right tonsillar cancer. Restaging. FINDINGS: AERODIGESTIVE TRACT: Right tonsillar mass remains resolved. Stable surgical clips in the right parapharyngeal space. Mild treatment related generalized pharyngeal mucosal space soft tissue thickening is stable. Stable post treatment mild retropharyngeal space edema or stranding. SALIVARY GLANDS: Surgically absent right submandibular gland. Stable right neck dissection surgical clips. Left submandibular gland and sublingual spaces remain negative. Negative parotid glands. THYROID : Unremarkable. LYMPH NODES: Prior right side node dissection. No cervical lymphadenopathy now, stable lymph node stations compared to 10/04/2023. SOFT TISSUES: Major vascular structures in the bilateral neck are enhancing and patent in the neck and at the skull base. The right IJ is present and mildly dominant. Mild generalized arterial tortuosity in the neck. BRAIN, ORBITS, SINUSES AND MASTOIDS: Mild chronic paranasal sinus mucoperiosteal thickening is stable. Middle ears and mastoids are well aerated. LUNGS AND MEDIASTINUM: Chronic emphysema, stable visible upper lungs. Dedicated chest CT the same day is reported separately. BONES: No acute or suspicious osseous lesion. Chronic cervical spine degeneration. IMPRESSION: 1. Stable and satisfactory post treatment appearance of the Neck, NIRADS category 1. 2. Chest CT reported separately. Electronically signed by: Helayne Hurst MD 10/10/2024 09:04 AM EDT RP Workstation: [id number]   CT Chest W Contrast Result Date: 10/08/2024 CLINICAL DATA:  Head/neck cancer, monitor * Tracking Code: BO * EXAM: CT CHEST WITH CONTRAST TECHNIQUE: Multidetector CT imaging  of the chest was performed during intravenous contrast administration. RADIATION DOSE REDUCTION: This exam was performed according to the departmental dose-optimization program which includes automated exposure control, adjustment of the mA and/or kV according to patient size and/or use of iterative reconstruction technique. CONTRAST:  75mL OMNIPAQUE  IOHEXOL  300 MG/ML  SOLN COMPARISON:  CT scan chest from 10/04/2023 FINDINGS: Cardiovascular: Normal cardiac size. Physiological pericardial effusion. There is aneurysmal dilation of the ascending aorta measuring up to 4.4 cm at the level of main pulmonary trunk. There is also mildly dilated proximal descending thoracic aorta measuring up to 4.1 cm in diameter. No significant interval change since the prior study. There are coronary artery calcifications, in keeping with coronary artery disease. There are also mild peripheral atherosclerotic vascular calcifications of thoracic aorta and its major branches. Mediastinum/Nodes: Visualized thyroid  gland appears grossly unremarkable. No solid / cystic mediastinal masses. The esophagus is nondistended precluding optimal assessment. No axillary, mediastinal or hilar lymphadenopathy by size criteria. Lungs/Pleura: The central tracheo-bronchial tree is patent. Mild-to-moderate upper lobe predominant centrilobular emphysema noted. Redemonstration of previously seen linear opacity along the posteromedial aspect of the right upper lobe this is unchanged since the prior study and may represent scarring versus mucous plugging/bronchocele. No mass or consolidation. No pleural effusion or pneumothorax. No suspicious lung nodules. Upper Abdomen: Redemonstration of 1.1 x 1.1 cm simple cyst in the hepatic dome, segment 8/4A junction region. There is a partially exophytic simple cyst arising from the left kidney upper pole, posteromedially measuring up to 6.2 x 7.8  cm. Stable mild diffuse thickening of the bilateral adrenal glands without  discrete nodule. Remaining visualized upper abdominal viscera within normal limits. Musculoskeletal: The visualized soft tissues of the chest wall are grossly unremarkable. No suspicious osseous lesions. There are mild multilevel degenerative changes in the visualized spine. IMPRESSION: 1. No metastatic disease identified within the chest. 2. 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 3. Multiple other nonacute observations, as described above. Aortic Atherosclerosis (ICD10-I70.0) and Emphysema (ICD10-J43.9). Electronically Signed   By: Ree Molt M.D.   On: 10/08/2024 11:22    Impression/Plan:    1) Head and Neck Cancer Status: Stable, no evidence of disease on clinical exam today. Patient seems to be doing well overall.    2) Nutritional Status: Stable, patient has started to gain weight.  Wt Readings from Last 3 Encounters:  10/21/24 181 lb (82.1 kg)  04/08/24 173 lb 1.6 oz (78.5 kg)  02/19/24 177 lb 9.6 oz (80.6 kg)  PEG tube: Removed in December of 2024.    3) Risk Factors: The patient has been educated about risk factors including alcohol and tobacco abuse; they understand that avoidance of alcohol and tobacco is important to prevent recurrences as well as other cancers.  He is still smoking approximately 6 cigarettes each day and again was advised to quit.   4) Swallowing: Doing well. He states his throat becomes painful after eating large amounts of food.   5) Dental: Encouraged to continue regular followup with dentistry, and dental hygiene including fluoride rinses. States he is seeing his dentist every 3 months.   6) Thyroid  function: stable, checking annually.  Lab Results  Component Value Date   TSH 2.923 02/19/2024    8) Follow-up: He will see Dr. Lauralee in May.  CT of the neck and chest have been ordered for 10/08/2024. We will see him after these scans to review. He was encouraged to call with any questions or concerns in  the meantime.   On date of service, in total, I spent *** minutes on this encounter. Patient was seen in person.  -  -----------------------------------  Lauraine Golden, MD

## 2024-10-23 ENCOUNTER — Other Ambulatory Visit: Payer: Self-pay

## 2024-10-23 DIAGNOSIS — C090 Malignant neoplasm of tonsillar fossa: Secondary | ICD-10-CM

## 2025-04-09 ENCOUNTER — Inpatient Hospital Stay

## 2025-04-09 ENCOUNTER — Inpatient Hospital Stay: Admitting: Hematology and Oncology

## 2025-08-18 ENCOUNTER — Ambulatory Visit: Admitting: Radiology
